# Patient Record
Sex: Male | Born: 1945 | Race: Black or African American | Hispanic: No | Marital: Single | Smoking: Current every day smoker
Health system: Southern US, Community
[De-identification: ages and names within clinical notes are randomized; demographics above are authoritative.]

## PROBLEM LIST (undated history)

## (undated) DIAGNOSIS — C3491 Malignant neoplasm of unspecified part of right bronchus or lung: Principal | ICD-10-CM

## (undated) DIAGNOSIS — C719 Malignant neoplasm of brain, unspecified: Secondary | ICD-10-CM

## (undated) DIAGNOSIS — I10 Essential (primary) hypertension: Secondary | ICD-10-CM

## (undated) DIAGNOSIS — R918 Other nonspecific abnormal finding of lung field: Secondary | ICD-10-CM

## (undated) DIAGNOSIS — R51 Headache: Secondary | ICD-10-CM

## (undated) DIAGNOSIS — K219 Gastro-esophageal reflux disease without esophagitis: Secondary | ICD-10-CM

## (undated) DIAGNOSIS — D571 Sickle-cell disease without crisis: Secondary | ICD-10-CM

## (undated) HISTORY — DX: Malignant neoplasm of unspecified part of right bronchus or lung: C34.91

## (undated) HISTORY — DX: Malignant neoplasm of brain, unspecified: C71.9

## (undated) HISTORY — PX: NO PAST SURGERIES: SHX2092

---

## 2011-10-11 ENCOUNTER — Emergency Department (HOSPITAL_COMMUNITY): Payer: Medicare Other

## 2011-10-11 ENCOUNTER — Other Ambulatory Visit: Payer: Self-pay

## 2011-10-11 ENCOUNTER — Observation Stay (HOSPITAL_COMMUNITY)
Admission: EM | Admit: 2011-10-11 | Discharge: 2011-10-12 | Disposition: A | Discharge status: Home / Self Care | Payer: Medicare Other | Source: Ambulatory Visit | Attending: Internal Medicine | Admitting: Internal Medicine

## 2011-10-11 ENCOUNTER — Inpatient Hospital Stay (HOSPITAL_COMMUNITY): Payer: Medicare Other

## 2011-10-11 ENCOUNTER — Encounter (HOSPITAL_COMMUNITY): Payer: Self-pay | Admitting: Emergency Medicine

## 2011-10-11 DIAGNOSIS — R911 Solitary pulmonary nodule: Secondary | ICD-10-CM | POA: Insufficient documentation

## 2011-10-11 DIAGNOSIS — F17200 Nicotine dependence, unspecified, uncomplicated: Secondary | ICD-10-CM

## 2011-10-11 DIAGNOSIS — I10 Essential (primary) hypertension: Secondary | ICD-10-CM

## 2011-10-11 DIAGNOSIS — F1010 Alcohol abuse, uncomplicated: Secondary | ICD-10-CM | POA: Diagnosis present

## 2011-10-11 DIAGNOSIS — R55 Syncope and collapse: Principal | ICD-10-CM | POA: Diagnosis present

## 2011-10-11 HISTORY — DX: Essential (primary) hypertension: I10

## 2011-10-11 HISTORY — DX: Sickle-cell disease without crisis: D57.1

## 2011-10-11 HISTORY — DX: Gastro-esophageal reflux disease without esophagitis: K21.9

## 2011-10-11 HISTORY — DX: Headache: R51

## 2011-10-11 LAB — CBC
MCHC: 37 g/dL — ABNORMAL HIGH (ref 30.0–36.0)
MCHC: 37.1 g/dL — ABNORMAL HIGH (ref 30.0–36.0)
MCV: 77.8 fL — ABNORMAL LOW (ref 78.0–100.0)
Platelets: 300 10*3/uL (ref 150–400)
RDW: 14.8 % (ref 11.5–15.5)
RDW: 14.6 % (ref 11.5–15.5)
WBC: 6.1 10*3/uL (ref 4.0–10.5)

## 2011-10-11 LAB — POCT I-STAT, CHEM 8
HCT: 49 % (ref 39.0–52.0)
Hemoglobin: 16.7 g/dL (ref 13.0–17.0)
Potassium: 3.5 mEq/L (ref 3.5–5.1)
Sodium: 140 mEq/L (ref 135–145)

## 2011-10-11 LAB — HEPATIC FUNCTION PANEL
ALT: 11 U/L (ref 0–53)
AST: 15 U/L (ref 0–37)
Bilirubin, Direct: 0.1 mg/dL (ref 0.0–0.3)
Indirect Bilirubin: 0.4 mg/dL (ref 0.3–0.9)
Total Protein: 7.5 g/dL (ref 6.0–8.3)

## 2011-10-11 LAB — URINALYSIS, ROUTINE W REFLEX MICROSCOPIC
Nitrite: NEGATIVE
Protein, ur: NEGATIVE mg/dL
Specific Gravity, Urine: 1.018 (ref 1.005–1.030)
Urobilinogen, UA: 1 mg/dL (ref 0.0–1.0)

## 2011-10-11 LAB — MAGNESIUM: Magnesium: 2.1 mg/dL (ref 1.5–2.5)

## 2011-10-11 LAB — CALCIUM
Calcium: 9.6 mg/dL (ref 8.4–10.5)
Calcium: 9.7 mg/dL (ref 8.4–10.5)

## 2011-10-11 LAB — CARDIAC PANEL(CRET KIN+CKTOT+MB+TROPI)
CK, MB: 1.7 ng/mL (ref 0.3–4.0)
Relative Index: INVALID (ref 0.0–2.5)
Troponin I: <0.3 ng/mL (ref <0.30)

## 2011-10-11 LAB — CREATININE, SERUM
GFR calc Af Amer: >90 mL/min (ref >90)
GFR calc non Af Amer: >90 mL/min (ref >90)

## 2011-10-11 LAB — POCT I-STAT TROPONIN I

## 2011-10-11 LAB — TSH: TSH: 1.095 u[IU]/mL (ref 0.350–4.500)

## 2011-10-11 MED ORDER — PROMETHAZINE HCL 25 MG/ML IJ SOLN
12.5000 mg | Freq: Four times a day (QID) | INTRAMUSCULAR | Status: DC | PRN
  Filled 2011-10-11: qty 1

## 2011-10-11 MED ORDER — IOHEXOL 300 MG/ML  SOLN
80.0000 mL | Freq: Once | INTRAMUSCULAR | Status: AC | PRN
  Administered 2011-10-11: 80 mL via INTRAVENOUS

## 2011-10-11 MED ORDER — LORAZEPAM 1 MG PO TABS: 1.0000 mg | ORAL_TABLET | Freq: Four times a day (QID) | ORAL | Status: DC | PRN

## 2011-10-11 MED ORDER — ADULT MULTIVITAMIN W/MINERALS CH
1.0000 | ORAL_TABLET | Freq: Every day | ORAL | Status: DC
  Administered 2011-10-11 – 2011-10-12 (×2): 1 via ORAL
  Filled 2011-10-11 (×2): qty 1

## 2011-10-11 MED ORDER — ALUM & MAG HYDROXIDE-SIMETH 200-200-20 MG/5ML PO SUSP: 30.0000 mL | Freq: Four times a day (QID) | ORAL | Status: DC | PRN

## 2011-10-11 MED ORDER — THIAMINE HCL 100 MG/ML IJ SOLN
Freq: Once | INTRAVENOUS | Status: AC
  Administered 2011-10-11: 15:00:00 via INTRAVENOUS
  Filled 2011-10-11: qty 1000

## 2011-10-11 MED ORDER — PROMETHAZINE HCL 25 MG PO TABS
12.5000 mg | ORAL_TABLET | Freq: Four times a day (QID) | ORAL | Status: DC | PRN
  Filled 2011-10-11: qty 1

## 2011-10-11 MED ORDER — NICOTINE 7 MG/24HR TD PT24
7.0000 mg | MEDICATED_PATCH | Freq: Every day | TRANSDERMAL | Status: DC
  Administered 2011-10-11 – 2011-10-12 (×2): 7 mg via TRANSDERMAL
  Filled 2011-10-11 (×2): qty 1

## 2011-10-11 MED ORDER — INFLUENZA VIRUS VACC SPLIT PF IM SUSP
0.5000 mL | Freq: Once | INTRAMUSCULAR | Status: DC
  Filled 2011-10-11: qty 0.5

## 2011-10-11 MED ORDER — AMLODIPINE BESYLATE 10 MG PO TABS
10.0000 mg | ORAL_TABLET | Freq: Every day | ORAL | Status: DC
  Administered 2011-10-11 – 2011-10-12 (×2): 10 mg via ORAL
  Filled 2011-10-11 (×2): qty 1

## 2011-10-11 MED ORDER — HEPARIN SODIUM (PORCINE) 5000 UNIT/ML IJ SOLN
5000.0000 [IU] | Freq: Three times a day (TID) | INTRAMUSCULAR | Status: DC
  Administered 2011-10-11 – 2011-10-12 (×3): 5000 [IU] via SUBCUTANEOUS
  Filled 2011-10-11 (×5): qty 1

## 2011-10-11 MED ORDER — VITAMIN B-1 100 MG PO TABS
100.0000 mg | ORAL_TABLET | Freq: Every day | ORAL | Status: DC
  Administered 2011-10-11 – 2011-10-12 (×2): 100 mg via ORAL
  Filled 2011-10-11 (×2): qty 1

## 2011-10-11 MED ORDER — ASPIRIN EC 81 MG PO TBEC
81.0000 mg | DELAYED_RELEASE_TABLET | Freq: Every day | ORAL | Status: DC
  Administered 2011-10-12: 81 mg via ORAL
  Filled 2011-10-11: qty 1

## 2011-10-11 MED ORDER — LORAZEPAM 2 MG/ML IJ SOLN: 1.0000 mg | Freq: Four times a day (QID) | INTRAMUSCULAR | Status: DC | PRN

## 2011-10-11 MED ORDER — THIAMINE HCL 100 MG/ML IJ SOLN
100.0000 mg | Freq: Every day | INTRAMUSCULAR | Status: DC
  Filled 2011-10-11 (×2): qty 1

## 2011-10-11 MED ORDER — ASPIRIN 81 MG PO CHEW: 81.0000 mg | CHEWABLE_TABLET | Freq: Once | ORAL | Status: DC

## 2011-10-11 MED ORDER — PNEUMOCOCCAL VAC POLYVALENT 25 MCG/0.5ML IJ INJ
0.5000 mL | INJECTION | INTRAMUSCULAR | Status: DC
  Filled 2011-10-11: qty 0.5

## 2011-10-11 MED ORDER — SODIUM CHLORIDE 0.9 % IJ SOLN: 3.0000 mL | Freq: Two times a day (BID) | INTRAMUSCULAR | Status: DC

## 2011-10-11 MED ORDER — FOLIC ACID 1 MG PO TABS
1.0000 mg | ORAL_TABLET | Freq: Every day | ORAL | Status: DC
  Administered 2011-10-11: 1 mg via ORAL
  Filled 2011-10-11 (×2): qty 1

## 2011-10-11 NOTE — Progress Notes (Signed)
Pt is from home and presented to the ED on 10/11/11 after having a syncopal episode.  Pt is alert and oriented x4 and his neuro assessment was within normal limits.  Skin is intact with no evidence of wounds or breakdown.  Pt is on telemetry running NSR.  Pt has been placed on fall and seizure precautions.  Will continue to treat per MD orders.

## 2011-10-11 NOTE — H&P (Signed)
Hospital Admission Note Date: 10/11/2011  Patient name: Brendan Hooper Medical record number: [id number] Date of birth: 1946/01/10 Age: 66 y.o. Gender: male PCP: Quitman Livings, MD, MD  Medical Service: Cecille Rubin  Attending physician:  Dr. Josem Kaufmann   1st Contact:   Dr. Dorise Hiss Pager: [phone] 2nd Contact:   Dr. Tonny Branch Pager:[phone] After 5 pm or weekends: 1st Contact:      Pager: [phone] 2nd Contact:      Pager: [phone]  Chief Complaint: " I passed out for 3 seconds."  History of Present Illness: The patient is a 67 YO male with past medical history of hypertension and smoking and alcohol abuse. He had an episode of witnessed syncope today. He was using the bathroom and was feeling weak afterwards. He then went to stand and walk back to bed and felt dizzy and like he was going to pass out. He then passed out onto the bed. He denied hitting his head. This event was witnessed by his son who told that he passed out for a few seconds. No shaking was witnessed. No loss of bowel or bladders. No problems answering questions directly afterwards. He is currently denying any pain anywhere. No chest pain or history of heart problems. No breathing problems. He admits to drinking about a 6 pack of beer per day and about a pint of alcohol per weekends. No dyspnea, orthopnea, leg swelling.    Patient denies following Sx before the episode: Lightheadedness, Racing heart ,Chest pain, Vision problems, Nausea/Vomiting/Diarrhea, tinnitus,Headache, Abdominal pain, Black/bloody stools, Fever/chills or SOB   Patient denies similar symptoms in the past.  ER Tx given - none  Advanced directives:Full Code  Meds: Medications Prior to Admission  Medication Dose Route Frequency Provider Last Rate Last Dose  . alum & mag hydroxide-simeth (MAALOX/MYLANTA) 200-200-20 MG/5ML suspension 30 mL  30 mL Oral Q6H PRN Deatra Robinson, MD      . aspirin chewable tablet 81 mg  81 mg Oral Once Doug Sou, MD      . aspirin EC  tablet 81 mg  81 mg Oral Daily Deatra Robinson, MD      . heparin injection 5,000 Units  5,000 Units Subcutaneous Q8H Deatra Robinson, MD      . promethazine (PHENERGAN) tablet 12.5 mg  12.5 mg Oral Q6H PRN Deatra Robinson, MD       Or  . promethazine (PHENERGAN) injection 12.5 mg  12.5 mg Intravenous Q6H PRN Deatra Robinson, MD      . sodium chloride 0.9 % 1,000 mL with thiamine 100 mg, folic acid 1 mg, multivitamins adult 10 mL infusion   Intravenous Once Deatra Robinson, MD      . sodium chloride 0.9 % injection 3 mL  3 mL Intravenous Q12H Deatra Robinson, MD       No current outpatient prescriptions on file as of 10/11/2011.   Meds: HCTZ 25 mg PO daily Amlodopine 10 mg PO daily  Allergies: Review of patient's allergies indicates no known allergies. Past Medical History  Diagnosis Date  . Hypertension    History reviewed. No pertinent past surgical history. No family history on file. History   Social History  . Marital Status: Single    Spouse Name: N/A    Number of Children: N/A  . Years of Education: N/A   Occupational History  . Not on file.   Social History Main Topics  . Smoking status: Current Everyday Smoker -- 1.0 packs/day  . Smokeless tobacco: Not on file  . Alcohol Use:  1.2 oz/week    2 Shots of liquor per week  . Drug Use: No  . Sexually Active:    Other Topics Concern  . Not on file   Social History Narrative  . No narrative on file    Review of Systems: Per HPI  Physical Exam: Blood pressure 151/74, pulse 86, temperature 98.3 F (36.8 C), temperature source Oral, resp. rate 25, SpO2 100.00%. General Appearance:  Alert, cooperative, no distress, appears stated age   Head:  Normocephalic, without obvious abnormality, atraumatic   Eyes:  PERRL, conjunctival injection bilaterally noted, EOM's intact, fundi benign, both eyes   Ears:  Normal TM's and external ear canals, both ears   Nose:  Nares normal, septum midline, mucosa normal, no drainage    or sinus tenderness   Throat:  Lips, mucosa, and tongue normal; poor dentition   Neck:  Supple, symmetrical, trachea midline, no adenopathy;  thyroid: No enlargement/tenderness/nodules; no carotid  bruit or JVD   Back:  Symmetric, no curvature, ROM normal, no CVA tenderness   Lungs:  Clear to auscultation bilaterally, respirations unlabored   Chest wall:  No tenderness or deformity   Heart:  Regular rate and rhythm, S1 and S2 normal, no murmur, rub  or gallop   Abdomen:  Soft, non-tender, bowel sounds active all four quadrants,  no masses, no organomegaly   Extremities:  No pedal edema or cyanoses bilaterally.  Pulses:  2+ and symmetric all extremities   Skin:  Skin color, texture, turgor normal, no rashes or lesions   Lymph nodes:  Cervical, supraclavicular, and axillary nodes normal    CXR: pending  Lab results: Basic Metabolic Panel:  [facility] 10/11/11 1208  NA 140  K 3.5  CL 102  CO2 --  GLUCOSE 101*  BUN 9  CREATININE 0.90  CALCIUM --  MG --  PHOS --   CBC:  Basename 10/11/11 1208 10/11/11 1147  WBC -- 4.6  NEUTROABS -- --  HGB 16.7 16.0  HCT 49.0 43.2  MCV -- 78.1  PLT -- 276   Imaging results:  CXR (PA/L): pending Other results: NWG:NFAOZHYQ. No old EKG for comparison.  Assessment & Plan by Problem:  ASSESSMENT: Mr. Dorer is a 66 y/o AA gentleman with a PMHx of HTN and alcoholism admitted with a  Witnessed syncopal episode. Differential list of Dx include but not limited to: *vasovagal episode vs *Arrhythmia vs *Seizure vs *TIA vs *PE (unlikely, modified geneva score is 0). *Incidental lung nodule noted, with smoking is high risk. Will work up.  PLAN: - Telemetry 24 hrs - Cardiac enzymes x 2 q 8hr - EKG x1 -  Consider 2D Echo to eval for wall motion abnormality. - UA, UDS, TSH - continue with home dose of Amlodipine and hold HCTZ - Start ASA - FLP, HIV - CIWA protocol -Fall and seizure precautions -NS +Folate/Thiamine IVF at 100  cc/hr - Heparin 5000 U SQ BID or Lovenox 40 mg SQ daily -CT chest with contrast to evaluate lung nodule.  Genella Mech 4:51 PM 10/11/2011  Signed: Deatra Robinson 10/11/2011, 2:10 PM

## 2011-10-11 NOTE — ED Notes (Signed)
Pt states he was coming out of the bathroom and fell across the bed. Denies dizziness but states he started sweating and felt nausea. Denies vomiting.witnessed per son.States he was not unconscious.

## 2011-10-11 NOTE — ED Notes (Signed)
Per EMS: Pt stated he felt nauseous his son witnessed him passing out, which lasted about a minute. BP: 122/72 84 R: 18 CBG: 101.  Pt is alert and oriented.  Pt stated he has "alot of alcohol" last night.  Pt had some diarrhea last week.

## 2011-10-11 NOTE — ED Provider Notes (Signed)
History     CSN: [id number]  Arrival date & time 10/11/11  1058   First MD Initiated Contact with Patient 10/11/11 1105      No chief complaint on file.   (Consider location/radiation/quality/duration/timing/severity/associated sxs/prior treatment) HPI Complains of syncopal event this morning immediately prior to coming here witnessed by his son .patient states he was sweaty for 1 or 2 minutes prior to event denies chest pain denies shortness of breath admits to drinking alcohol last night approximately 1 pint. Presently asymptomatic. No treatment prior to coming here. No pain. No other associated symptoms Past Medical History  Diagnosis Date  . Hypertension     History reviewed. No pertinent past surgical history.  No family history on file.  History  Substance Use Topics  . Smoking status: Current Everyday Smoker -- 1.0 packs/day  . Smokeless tobacco: Not on file  . Alcohol Use: 1.2 oz/week    2 Shots of liquor per week      Review of Systems  Constitutional: Negative.   HENT: Negative.   Respiratory: Negative.   Cardiovascular: Negative.        Syncope  Gastrointestinal: Negative.   Musculoskeletal: Negative.   Skin: Negative.        Diaphoresis  Neurological: Negative.   Hematological: Negative.   Psychiatric/Behavioral: Negative.   All other systems reviewed and are negative.    Allergies  Review of patient's allergies indicates no known allergies.  Home Medications   Current Outpatient Rx  Name Route Sig Dispense Refill  . AMLODIPINE BESYLATE 10 MG PO TABS Oral Take 10 mg by mouth daily.    Marland Kitchen HYDROCHLOROTHIAZIDE 25 MG PO TABS Oral Take 25 mg by mouth daily.      BP 153/78  Pulse 80  Temp(Src) 98.3 F (36.8 C) (Oral)  Resp 15  SpO2 98%  Physical Exam  Nursing note and vitals reviewed. Constitutional: He appears well-developed and well-nourished.  HENT:  Head: Normocephalic and atraumatic.  Eyes: Conjunctivae are normal. Pupils are equal,  round, and reactive to light.  Neck: Neck supple. No tracheal deviation present. No thyromegaly present.       No bruit  Cardiovascular: Normal rate and regular rhythm.   No murmur heard. Pulmonary/Chest: Effort normal and breath sounds normal.  Abdominal: Soft. Bowel sounds are normal. He exhibits no distension. There is no tenderness.  Musculoskeletal: Normal range of motion. He exhibits no edema and no tenderness.  Neurological: He is alert. Coordination normal.  Skin: Skin is warm and dry. No rash noted.  Psychiatric: He has a normal mood and affect.    ED Course  Procedures (including critical care time) 12:35 PM resting comfortably alert Glasgow Coma Score 15 Labs Reviewed - No data to display No results found.   Date: 10/11/2011  Rate: 85  Rhythm: normal sinus rhythm  QRS Axis: left  Intervals: normal  ST/T Wave abnormalities: nonspecific T wave changes  Conduction Disutrbances:left anterior fascicular block  Narrative Interpretation:   Old EKG Reviewed: none available  No diagnosis found.    MDM  Spoke with resident physician from [facility] outpatient clinic teaching service Plan admit telemetry. I suggest monitor for alcohol withdrawal Diagnosis  syncope        Doug Sou, MD 10/11/11 1239

## 2011-10-12 DIAGNOSIS — R55 Syncope and collapse: Secondary | ICD-10-CM

## 2011-10-12 DIAGNOSIS — F1010 Alcohol abuse, uncomplicated: Secondary | ICD-10-CM

## 2011-10-12 LAB — RAPID URINE DRUG SCREEN, HOSP PERFORMED
Barbiturates: NOT DETECTED
Benzodiazepines: NOT DETECTED
Tetrahydrocannabinol: NOT DETECTED

## 2011-10-12 LAB — GLUCOSE, CAPILLARY: Glucose-Capillary: 107 mg/dL — ABNORMAL HIGH (ref 70–99)

## 2011-10-12 LAB — CARDIAC PANEL(CRET KIN+CKTOT+MB+TROPI): Relative Index: INVALID (ref 0.0–2.5)

## 2011-10-12 NOTE — H&P (Signed)
Internal Medicine Attending Admission Note Date: 10/12/2011  Patient name: Brendan Hooper Medical record number: [id number] Date of birth: Jul 21, 1946 Age: 66 y.o. Gender: male  I saw and evaluated the patient. I reviewed the resident's note and I agree with the resident's findings and plan as documented in the resident's note.  Chief Complaint(s): Witnessed syncopal episode.  History - key components related to admission:  Brendan Hooper is a 66 year old man with a history of hypertension and alcohol and tobacco abuse who presents with an episode of witnessed syncope on the morning of admission. Apparently Brendan Hooper had a recent spat of diarrhea. He was in the bathroom having a bowel movement and felt weak when he got up. He walked 15 feet to his bed and felt lightheaded. There was no vertigo. When he got to the bed he apparently lost consciousness onto the bed and therefore had no injury. This event was witnessed by his son who said that Brendan Hooper was out for approximately 2-3 seconds. In the 10 seconds prior to the syncope Brendan Hooper denies palpitations, chest pain, shortness of breath, headaches, numbness, weakness, or change in vision. He has actually felt well recently with the exception of the mild diarrhea. He has no history of syncope and denies a family history of sudden cardiac death. He is without acute complaints at this time.  Physical Exam - key components related to admission:  Filed Vitals:   10/11/11 2043 10/12/11 0607 10/12/11 0608 10/12/11 0609  BP: 127/79 136/70 136/76 134/82  Pulse: 76 66 64 64  Temp: 98.3 F (36.8 C) 98.1 F (36.7 C)    TempSrc: Oral Oral    Resp: 18 16    Height:      Weight:      SpO2: 97% 97%     General: Well-developed well-nourished man sitting comfortably in bed eating pancakes for breakfast in no acute distress. HEENT: Atraumatic. Lungs: Clear to auscultation bilaterally without wheezes, rhonchi, or rales. Heart: Regular rate and rhythm without  murmurs, rubs, or gallops. Abdomen: Soft, non tender, active bowel sounds. Extremities: Without edema.  Lab results:  Basic Metabolic Panel:  Basename 10/11/11 1604 10/11/11 1441 10/11/11 1208  NA -- -- 140  K -- -- 3.5  CL -- -- 102  CO2 -- -- --  GLUCOSE -- -- 101*  BUN -- -- 9  CREATININE -- 0.79 0.90  CALCIUM 9.7 9.6 --  MG -- 2.1 --  PHOS -- -- --   Liver Function Tests:  [facility] 10/11/11 1604  AST 15  ALT 11  ALKPHOS 81  BILITOT 0.5  PROT 7.5  ALBUMIN 3.9   CBC:  Basename 10/11/11 1441 10/11/11 1208 10/11/11 1147  WBC 6.1 -- 4.6  NEUTROABS -- -- --  HGB 16.4 16.7 --  HCT 44.2 49.0 --  MCV 77.8* -- 78.1  PLT 300 -- 276   Cardiac Enzymes:  Basename 10/12/11 0556 10/11/11 2104 10/11/11 1441  CKTOTAL 73 72 74  CKMB 1.7 1.7 2.0  CKMBINDEX -- -- --  TROPONINI <0.30 <0.30 <0.30   CBG:  Basename 10/12/11 0742  GLUCAP 107*   Thyroid Function Tests:  Basename 10/11/11 1441  TSH 1.095  T4TOTAL --  FREET4 --  T3FREE --  THYROIDAB --   Urine Drug Screen:  Negative  Alcohol Level:  Basename 10/11/11 1604  ETH <11   Urinalysis:  Unremarkable  Imaging results:  X-ray Chest Pa And Lateral   10/11/2011  *RADIOLOGY REPORT*  Clinical Data: Syncope.  Hypertension.  Smoking history.  CHEST - 2 VIEW  Comparison: None.  Findings: Heart size is normal.  The aorta shows calcification and unfolding.  There is an approximately 15 mm mass within the inferior right upper lobe just above the minor fissure.  The remainder the chest is clear.  No effusions.  No bony abnormalities.  IMPRESSION: Approximately 15 mm mass within the inferior right upper lobe.  Are there old films for comparison?  This is worrisome for developing lung cancer.  In the absence of old studies, chest CT with contrast would be suggested.  These results will be called to the ordering clinician or representative by the Radiologist Assistant, and communication documented in the PACS Dashboard.   Original Report Authenticated By: Thomasenia Sales, M.D.   Ct Chest W Contrast  10/11/2011  *RADIOLOGY REPORT*  Clinical Data: Pulmonary nodule on chest radiography.  CT CHEST WITH CONTRAST  Technique:  Multidetector CT imaging of the chest was performed following the standard protocol during bolus administration of intravenous contrast.  Contrast: 80mL OMNIPAQUE IOHEXOL 300 MG/ML IV SOLN  Comparison: 10/11/2011  Findings: Left infrahilar node measures 0.8 cm in short axis.  A right infrahilar node measures 0.9 cm in short axis.  An indistinct right hilar node measures 1.0 cm in short axis.  No paratracheal or AP window adenopathy is observed.  A 1.5 x 1.6 cm right upper lobe pulmonary nodule is present just above the minor fissure on image 29 of series 3, corresponding to the nodular density on chest radiography.  Subsegmental atelectasis is present in both lower lobes. Centrilobular emphysema is present.  Old healed right-sided rib fractures are noted.  A hypodense 5 mm lesion in the medial segment left hepatic lobe, image 50 of series 2, is probably a cyst but technically nonspecific.  Next phytic fluid density 3.9 x 3.0 cm lesion of the right kidney upper pole is compatible with cyst.  We partially visualize another right mid kidney cyst.  IMPRESSION:  1.  1.6 cm right upper lobe pulmonary nodule is based along the minor fissure, but remain suspicious for malignancy.  Consider tissue sampling or PET CT for further characterization. 2.  Borderline right hilar and borderline bilateral infrahilar adenopathy. 3.  Subsegmental atelectasis in both lower lobes. 4.  Right renal cysts; probable cyst in the medial segment left hepatic lobe. 5.  Emphysema.  Original Report Authenticated By: Dellia Cloud, M.D.   Other results:  EKG: normal sinus rhythm at 85 beats per minute, left anterior hemiblock, no significant Q waves, no LVH, good R wave progression, no ST-T changes. No comparisons immediately  available.  Assessment & Plan by Problem:  Brendan Hooper is a 66 year old man with a history of hypertension, and tobacco and alcohol abuse who presents after an episode of witnessed syncope. The event occurred after having a bowel movement and standing up to walk to the bed. Orthostatic vital signs were negative. Thus orthostatic hypotension is unlikely. It is most likely he had a vasovagal episode associated with a Valsalva maneuver during a bowel movement and then standing up and walking to the bed. He is ruled out for myocardial infarction, has a negative neurologic exam, and his had no events on telemetry overnight. Given this unremarkable evaluation further invasive testing is not indicated at this time.  The incidentally found nodule on his chest x-ray is of some concern. With his history of smoking he is at risk for lung cancer. The 1.5 x 1.6 cm anterior subsegment right upper  lobe nodule will need further evaluation. Unfortunately, the last chest x-ray he had [place] in [facility] approximately 10 years ago and is unlikely to be available for review. CT scan shows borderline hilar adenopathy but no other lesions of concern. Given the intermediate to high risk with his history of smoking further evaluation is prudent. Fortunately, the workup of lung nodules is an outpatient procedure.  1) Syncope: Upon discharge we will refer Brendan Hooper to cardiology in order to obtain a Holter monitor. We will also advise him to get up slowly especially after having a bowel movement.  2) Right upper lobe anterior subsegment nodule: We will contact Brendan Hooper primary care provider and informed him of the nodule. It is deep in the parenchyma and will be difficult to access with a percutaneous needle. There did not appear to be any air bronchograms leading into the lesion making bronchoscopy a less than 50-50 chance of being diagnostic. We would recommend a CT PET scan to further assess the nodule and the mediastinum.  If the nodule were to be metabolically active our recommendation would be to refer him to thoracic surgery for a diagnostic and potentially therapeutic excision assuming the mediastinum did not light up. If the mediastinum had metabolic activity that was increased one could consider doing a mediastinoscopy for tissue and potential staging.  3) Disposition: I agree with the housestaff's plan to discharge Brendan Hooper home today with an outpatient Holter monitor and likely PET CT scan if his primary care provider agrees.

## 2011-10-12 NOTE — Discharge Summary (Signed)
Internal Medicine Teaching [facility] Discharge Note  Name: Brendan Hooper MRN: [id number] DOB: [birth year] 66 y.o.  Date of Admission: 10/11/2011 10:58 AM Date of Discharge: 10/12/2011 Attending Physician: Rocco Serene, MD  Discharge Diagnosis: Active Problems:  Syncope  Alcohol abuse  HTN (hypertension)  Active smoker Pulmonary Nodule  Discharge Medications: Medication List  As of 10/12/2011 10:27 AM   TAKE these medications         amLODipine 10 MG tablet   Commonly known as: NORVASC   Take 10 mg by mouth daily.      hydrochlorothiazide 25 MG tablet   Commonly known as: HYDRODIURIL   Take 25 mg by mouth daily.            Disposition and follow-up:   Brendan Hooper was discharged from [facility] in Good condition.    Follow-up Appointments: Follow-up Information    Follow up with [facility], MD. (Please go to your regularly scheduled apt on Feb 26. )         Discharge Orders    Future Orders Please Complete By Expires   Diet - low sodium heart healthy      Increase activity slowly         Consultations:    Procedures Performed:  X-ray Chest Pa And Lateral   10/11/2011  *RADIOLOGY REPORT*  Clinical Data: Syncope.  Hypertension.  Smoking history.  CHEST - 2 VIEW  Comparison: None.  Findings: Heart size is normal.  The aorta shows calcification and unfolding.  There is an approximately 15 mm mass within the inferior right upper lobe just above the minor fissure.  The remainder the chest is clear.  No effusions.  No bony abnormalities.  IMPRESSION: Approximately 15 mm mass within the inferior right upper lobe.  Are there old films for comparison?  This is worrisome for developing lung cancer.  In the absence of old studies, chest CT with contrast would be suggested.  These results will be called to the ordering clinician or representative by the Radiologist Assistant, and communication documented in the PACS Dashboard.  Original Report  Authenticated By: Thomasenia Sales, M.D.   Ct Chest W Contrast  10/11/2011  *RADIOLOGY REPORT*  Clinical Data: Pulmonary nodule on chest radiography.  CT CHEST WITH CONTRAST  Technique:  Multidetector CT imaging of the chest was performed following the standard protocol during bolus administration of intravenous contrast.  Contrast: 80mL OMNIPAQUE IOHEXOL 300 MG/ML IV SOLN  Comparison: 10/11/2011  Findings: Left infrahilar node measures 0.8 cm in short axis.  A right infrahilar node measures 0.9 cm in short axis.  An indistinct right hilar node measures 1.0 cm in short axis.  No paratracheal or AP window adenopathy is observed.  A 1.5 x 1.6 cm right upper lobe pulmonary nodule is present just above the minor fissure on image 29 of series 3, corresponding to the nodular density on chest radiography.  Subsegmental atelectasis is present in both lower lobes. Centrilobular emphysema is present.  Old healed right-sided rib fractures are noted.  A hypodense 5 mm lesion in the medial segment left hepatic lobe, image 50 of series 2, is probably a cyst but technically nonspecific.  Next phytic fluid density 3.9 x 3.0 cm lesion of the right kidney upper pole is compatible with cyst.  We partially visualize another right mid kidney cyst.  IMPRESSION:  1.  1.6 cm right upper lobe pulmonary nodule is based along the minor fissure, but remain suspicious for malignancy.  Consider tissue sampling or PET CT for further characterization. 2.  Borderline right hilar and borderline bilateral infrahilar adenopathy. 3.  Subsegmental atelectasis in both lower lobes. 4.  Right renal cysts; probable cyst in the medial segment left hepatic lobe. 5.  Emphysema.  Original Report Authenticated By: Dellia Cloud, M.D.   Admission HPI:  The patient is a 66 YO male with past medical history of hypertension and smoking and alcohol abuse. He had an episode of witnessed syncope today. He was using the bathroom and was feeling weak  afterwards. He then went to stand and walk back to bed and felt dizzy and like he was going to pass out. He then passed out onto the bed. He denied hitting his head. This event was witnessed by his son who told that he passed out for a few seconds. No shaking was witnessed. No loss of bowel or bladders. No problems answering questions directly afterwards. He is currently denying any pain anywhere. No chest pain or history of heart problems. No breathing problems. He admits to drinking about a 6 pack of beer per day and about a pint of alcohol per weekends. No dyspnea, orthopnea, leg swelling.  Patient denies following Sx before the episode: Lightheadedness, Racing heart ,Chest pain, Vision problems, Nausea/Vomiting/Diarrhea, tinnitus,Headache, Abdominal pain, Black/bloody stools, Fever/chills or SOB  Patient denies similar symptoms in the past.  ER Tx given - none  Advanced directives:Full Code  Hospital Course by problem list:  Syncope - Likely cause was vasovagal.  Episode occurred after defecation. No events on cardiac monitoring overnight and no new EKG changes. Could consider Holter monitor as an out-patient to verify no arrythmia. Discussed this with the patient and he seemed to want to hold off on this option for now but I did give him information about Holter monitoring. No ECHO needed at this time. Cardiac enzymes were negative during this hospitalization. Orthostatic vitals were done and were negative. Will leave Holter up to the discretion of the PCP.    Alcohol abuse - Admits to 6 pack per night and 1 pint of alcohol on some weekends. Discussed cessation with him and he does not think it is a problem at this time and would not like to quit or cut back. Continue to advise cessation in the future.    HTN (hypertension) - BP controlled during this stay on his home medications and discharged on his home medications: amlodipine 10 mg PO daily and HCTZ 25 mg PO daily. BP on discharge was 134/82.    Active smoker - Advised cessation and he did not seem to want to quit at this time. Continue to encourage cessation or cutting back.  Pulmonary Nodule - Noted on CXR and was evaluated with CT with contrast of the chest. No past CXR to compare to and per the patient he had an old one in jail but we could not access that. CT revealed a lymph node and recommended either tissue sampling or PET CT. We discussed with the patient the seriousness of this lesion and that about 40% of lesions this size are cancerous. He did not seem very interested in following up on this but it is very important that he get follow up. If he does not want a PET or sampling then at the very least a repeat CT in 3-6 months should be obtained for stability. Sampling may be difficult given the placement of the lesion but if PET is done and lymph node is positive then that  may be easier to sample for biopsy.    Discharge Vitals:  BP 134/82  Pulse 64  Temp(Src) 98.1 F (36.7 C) (Oral)  Resp 16  Ht 6' (1.829 m)  Wt 156 lb (70.761 kg)  BMI 21.16 kg/m2  SpO2 97%  Discharge Labs:  Results for orders placed during the hospital encounter of 10/11/11 (from the past 24 hour(s))  CBC     Status: Abnormal   Collection Time   10/11/11 11:47 AM      Component Value Range   WBC 4.6  4.0 - 10.5 (K/uL)   RBC 5.53  4.22 - 5.81 (MIL/uL)   Hemoglobin 16.0  13.0 - 17.0 (g/dL)   HCT 47.8  29.5 - 62.1 (%)   MCV 78.1  78.0 - 100.0 (fL)   MCH 28.9  26.0 - 34.0 (pg)   MCHC 37.0 (*) 30.0 - 36.0 (g/dL)   RDW 30.8  65.7 - 84.6 (%)   Platelets 276  150 - 400 (K/uL)  POCT I-STAT TROPONIN I     Status: Normal   Collection Time   10/11/11 12:07 PM      Component Value Range   Troponin i, poc 0.00  0.00 - 0.08 (ng/mL)   Comment 3           POCT I-STAT, CHEM 8     Status: Abnormal   Collection Time   10/11/11 12:08 PM      Component Value Range   Sodium 140  135 - 145 (mEq/L)   Potassium 3.5  3.5 - 5.1 (mEq/L)   Chloride 102  96 - 112 (mEq/L)    BUN 9  6 - 23 (mg/dL)   Creatinine, Ser 9.62  0.50 - 1.35 (mg/dL)   Glucose, Bld 952 (*) 70 - 99 (mg/dL)   Calcium, Ion 8.41  3.24 - 1.32 (mmol/L)   TCO2 29  0 - 100 (mmol/L)   Hemoglobin 16.7  13.0 - 17.0 (g/dL)   HCT 40.1  02.7 - 25.3 (%)  URINALYSIS, ROUTINE W REFLEX MICROSCOPIC     Status: Normal   Collection Time   10/11/11  2:08 PM      Component Value Range   Color, Urine YELLOW  YELLOW    APPearance CLEAR  CLEAR    Specific Gravity, Urine 1.018  1.005 - 1.030    pH 5.5  5.0 - 8.0    Glucose, UA NEGATIVE  NEGATIVE (mg/dL)   Hgb urine dipstick NEGATIVE  NEGATIVE    Bilirubin Urine NEGATIVE  NEGATIVE    Ketones, ur NEGATIVE  NEGATIVE (mg/dL)   Protein, ur NEGATIVE  NEGATIVE (mg/dL)   Urobilinogen, UA 1.0  0.0 - 1.0 (mg/dL)   Nitrite NEGATIVE  NEGATIVE    Leukocytes, UA NEGATIVE  NEGATIVE   CBC     Status: Abnormal   Collection Time   10/11/11  2:41 PM      Component Value Range   WBC 6.1  4.0 - 10.5 (K/uL)   RBC 5.68  4.22 - 5.81 (MIL/uL)   Hemoglobin 16.4  13.0 - 17.0 (g/dL)   HCT 66.4  40.3 - 47.4 (%)   MCV 77.8 (*) 78.0 - 100.0 (fL)   MCH 28.9  26.0 - 34.0 (pg)   MCHC 37.1 (*) 30.0 - 36.0 (g/dL)   RDW 25.9  56.3 - 87.5 (%)   Platelets 300  150 - 400 (K/uL)  CREATININE, SERUM     Status: Normal   Collection Time   10/11/11  2:41 PM      Component Value Range   Creatinine, Ser 0.79  0.50 - 1.35 (mg/dL)   GFR calc non Af Amer >90  >90 (mL/min)   GFR calc Af Amer >90  >90 (mL/min)  CALCIUM     Status: Normal   Collection Time   10/11/11  2:41 PM      Component Value Range   Calcium 9.6  8.4 - 10.5 (mg/dL)  MAGNESIUM     Status: Normal   Collection Time   10/11/11  2:41 PM      Component Value Range   Magnesium 2.1  1.5 - 2.5 (mg/dL)  TSH     Status: Normal   Collection Time   10/11/11  2:41 PM      Component Value Range   TSH 1.095  0.350 - 4.500 (uIU/mL)  CARDIAC PANEL(CRET KIN+CKTOT+MB+TROPI)     Status: Normal   Collection Time   10/11/11  2:41 PM       Component Value Range   Total CK 74  7 - 232 (U/L)   CK, MB 2.0  0.3 - 4.0 (ng/mL)   Troponin I <0.30  <0.30 (ng/mL)   Relative Index RELATIVE INDEX IS INVALID  0.0 - 2.5   CALCIUM     Status: Normal   Collection Time   10/11/11  4:04 PM      Component Value Range   Calcium 9.7  8.4 - 10.5 (mg/dL)  HEPATIC FUNCTION PANEL     Status: Normal   Collection Time   10/11/11  4:04 PM      Component Value Range   Total Protein 7.5  6.0 - 8.3 (g/dL)   Albumin 3.9  3.5 - 5.2 (g/dL)   AST 15  0 - 37 (U/L)   ALT 11  0 - 53 (U/L)   Alkaline Phosphatase 81  39 - 117 (U/L)   Total Bilirubin 0.5  0.3 - 1.2 (mg/dL)   Bilirubin, Direct 0.1  0.0 - 0.3 (mg/dL)   Indirect Bilirubin 0.4  0.3 - 0.9 (mg/dL)  ETHANOL     Status: Normal   Collection Time   10/11/11  4:04 PM      Component Value Range   Alcohol, Ethyl (B) <11  0 - 11 (mg/dL)  CARDIAC PANEL(CRET KIN+CKTOT+MB+TROPI)     Status: Normal   Collection Time   10/11/11  9:04 PM      Component Value Range   Total CK 72  7 - 232 (U/L)   CK, MB 1.7  0.3 - 4.0 (ng/mL)   Troponin I <0.30  <0.30 (ng/mL)   Relative Index RELATIVE INDEX IS INVALID  0.0 - 2.5   URINE RAPID DRUG SCREEN (HOSP PERFORMED)     Status: Normal   Collection Time   10/12/11  2:00 AM      Component Value Range   Opiates NONE DETECTED  NONE DETECTED    Cocaine NONE DETECTED  NONE DETECTED    Benzodiazepines NONE DETECTED  NONE DETECTED    Amphetamines NONE DETECTED  NONE DETECTED    Tetrahydrocannabinol NONE DETECTED  NONE DETECTED    Barbiturates NONE DETECTED  NONE DETECTED   CARDIAC PANEL(CRET KIN+CKTOT+MB+TROPI)     Status: Normal   Collection Time   10/12/11  5:56 AM      Component Value Range   Total CK 73  7 - 232 (U/L)   CK, MB 1.7  0.3 - 4.0 (ng/mL)   Troponin I <0.30  <  0.30 (ng/mL)   Relative Index RELATIVE INDEX IS INVALID  0.0 - 2.5   GLUCOSE, CAPILLARY     Status: Abnormal   Collection Time   10/12/11  7:42 AM      Component Value Range   Glucose-Capillary  107 (*) 70 - 99 (mg/dL)    Signed: Genella Mech 10/12/2011, 10:27 AM

## 2011-10-12 NOTE — Discharge Instructions (Signed)
You were seen for an episode of passing out. We do not believe that this is from your heart however you should talk to your doctor to see if he thinks it would be helpful to wear a monitor to watch your heart for several weeks. We did a scan of your lungs and you have a spot on your lungs that we think needs to be looked at further. We will send our recommendations to your doctor and it is very important that you follow up with this. The size that yours is has about a 30-40% chance of being cancer and should be followed.

## 2011-10-12 NOTE — Progress Notes (Signed)
Subjective: Pt is feeling well this morning. No complaints. He ate well last night and this morning. He is denying any pain anywhere. No chest pain, no headache. No repeat syncope or dizziness or lightheadedness.   Objective: Vital signs in last 24 hours: Filed Vitals:   10/11/11 2043 10/12/11 0607 10/12/11 0608 10/12/11 0609  BP: 127/79 136/70 136/76 134/82  Pulse: 76 66 64 64  Temp: 98.3 F (36.8 C) 98.1 F (36.7 C)    TempSrc: Oral Oral    Resp: 18 16    Height:      Weight:      SpO2: 97% 97%     Weight change:   Intake/Output Summary (Last 24 hours) at 10/12/11 1022 Last data filed at 10/11/11 2100  Gross per 24 hour  Intake    360 ml  Output      0 ml  Net    360 ml   Physical Exam: General: resting in bed HEENT: PERRL, EOMI, no scleral icterus Cardiac: RRR, no rubs, murmurs or gallops Pulm: clear to auscultation bilaterally, moving normal volumes of air Abd: soft, nontender, nondistended, BS present Ext: warm and well perfused, no pedal edema Neuro: alert and oriented X3, cranial nerves II-XII grossly intact  Lab Results: Basic Metabolic Panel:  Lab 10/11/11 4098 10/11/11 1441 10/11/11 1208  NA -- -- 140  K -- -- 3.5  CL -- -- 102  CO2 -- -- --  GLUCOSE -- -- 101*  BUN -- -- 9  CREATININE -- 0.79 0.90  CALCIUM 9.7 9.6 --  MG -- 2.1 --  PHOS -- -- --   Liver Function Tests:  Lab 10/11/11 1604  AST 15  ALT 11  ALKPHOS 81  BILITOT 0.5  PROT 7.5  ALBUMIN 3.9   CBC:  Lab 10/11/11 1441 10/11/11 1208 10/11/11 1147  WBC 6.1 -- 4.6  NEUTROABS -- -- --  HGB 16.4 16.7 --  HCT 44.2 49.0 --  MCV 77.8* -- 78.1  PLT 300 -- 276   Cardiac Enzymes:  Lab 10/12/11 0556 10/11/11 2104 10/11/11 1441  CKTOTAL 73 72 74  CKMB 1.7 1.7 2.0  CKMBINDEX -- -- --  TROPONINI <0.30 <0.30 <0.30   CBG:  Lab 10/12/11 0742  GLUCAP 107*   Thyroid Function Tests:  Lab 10/11/11 1441  TSH 1.095  T4TOTAL --  FREET4 --  T3FREE --  THYROIDAB --   Urine Drug  Screen: Drugs of Abuse     Component Value Date/Time   LABOPIA NONE DETECTED 10/12/2011 0200   COCAINSCRNUR NONE DETECTED 10/12/2011 0200   LABBENZ NONE DETECTED 10/12/2011 0200   AMPHETMU NONE DETECTED 10/12/2011 0200   THCU NONE DETECTED 10/12/2011 0200   LABBARB NONE DETECTED 10/12/2011 0200    Alcohol Level:  Lab 10/11/11 1604  ETH <11   Urinalysis:  Lab 10/11/11 1408  COLORURINE YELLOW  LABSPEC 1.018  PHURINE 5.5  GLUCOSEU NEGATIVE  HGBUR NEGATIVE  BILIRUBINUR NEGATIVE  KETONESUR NEGATIVE  PROTEINUR NEGATIVE  UROBILINOGEN 1.0  NITRITE NEGATIVE  LEUKOCYTESUR NEGATIVE   Studies/Results: X-ray Chest Pa And Lateral   10/11/2011  *RADIOLOGY REPORT*  Clinical Data: Syncope.  Hypertension.  Smoking history.  CHEST - 2 VIEW  Comparison: None.  Findings: Heart size is normal.  The aorta shows calcification and unfolding.  There is an approximately 15 mm mass within the inferior right upper lobe just above the minor fissure.  The remainder the chest is clear.  No effusions.  No bony abnormalities.  IMPRESSION:  Approximately 15 mm mass within the inferior right upper lobe.  Are there old films for comparison?  This is worrisome for developing lung cancer.  In the absence of old studies, chest CT with contrast would be suggested.  These results will be called to the ordering clinician or representative by the Radiologist Assistant, and communication documented in the PACS Dashboard.  Original Report Authenticated By: Thomasenia Sales, M.D.   Ct Chest W Contrast  10/11/2011  *RADIOLOGY REPORT*  Clinical Data: Pulmonary nodule on chest radiography.  CT CHEST WITH CONTRAST  Technique:  Multidetector CT imaging of the chest was performed following the standard protocol during bolus administration of intravenous contrast.  Contrast: 80mL OMNIPAQUE IOHEXOL 300 MG/ML IV SOLN  Comparison: 10/11/2011  Findings: Left infrahilar node measures 0.8 cm in short axis.  A right infrahilar node measures 0.9 cm  in short axis.  An indistinct right hilar node measures 1.0 cm in short axis.  No paratracheal or AP window adenopathy is observed.  A 1.5 x 1.6 cm right upper lobe pulmonary nodule is present just above the minor fissure on image 29 of series 3, corresponding to the nodular density on chest radiography.  Subsegmental atelectasis is present in both lower lobes. Centrilobular emphysema is present.  Old healed right-sided rib fractures are noted.  A hypodense 5 mm lesion in the medial segment left hepatic lobe, image 50 of series 2, is probably a cyst but technically nonspecific.  Next phytic fluid density 3.9 x 3.0 cm lesion of the right kidney upper pole is compatible with cyst.  We partially visualize another right mid kidney cyst.  IMPRESSION:  1.  1.6 cm right upper lobe pulmonary nodule is based along the minor fissure, but remain suspicious for malignancy.  Consider tissue sampling or PET CT for further characterization. 2.  Borderline right hilar and borderline bilateral infrahilar adenopathy. 3.  Subsegmental atelectasis in both lower lobes. 4.  Right renal cysts; probable cyst in the medial segment left hepatic lobe. 5.  Emphysema.  Original Report Authenticated By: Dellia Cloud, M.D.   Medications: I have reviewed the patient's current medications. Scheduled Meds:   . amLODipine  10 mg Oral Daily  . aspirin  81 mg Oral Once  . aspirin EC  81 mg Oral Daily  . folic acid  1 mg Oral Daily  . heparin  5,000 Units Subcutaneous Q8H  . influenza  inactive virus vaccine  0.5 mL Intramuscular Once  . mulitivitamin with minerals  1 tablet Oral Daily  . nicotine  7 mg Transdermal Daily  . pneumococcal 23 valent vaccine  0.5 mL Intramuscular Tomorrow-1000  . general admission iv infusion   Intravenous Once  . sodium chloride  3 mL Intravenous Q12H  . thiamine  100 mg Oral Daily   Or  . thiamine  100 mg Intravenous Daily   Continuous Infusions:  PRN Meds:.alum & mag hydroxide-simeth,  iohexol, LORazepam, LORazepam, promethazine, promethazine Assessment/Plan:  Syncope - Has had no events on telemetry overnight, no problems overnight. No repeat episodes. Will be stable for discharge today.    Alcohol abuse - Was on CIWA, not requiring ativan.    HTN (hypertension) - BP is controlled. 134/82.    Active smoker - nodule on CXR which was evaluated on CT with contrast. We would recommend follow up with PET versus tissue sampling. Suspicious for malignancy. Spoke with patient about this and gave him our recommendations.   Disposition - Pt stable to go home today.  LOS: 1 day   Genella Mech 10/12/2011, 10:22 AM

## 2011-10-15 LAB — HIV-1 RNA QUANT-NO REFLEX-BLD: HIV 1 RNA Quant: <20 copies/mL (ref <20)

## 2011-10-21 NOTE — Discharge Summary (Signed)
I too explained the importance of follow-up for this pulmonary nodule given our concerns for malignancy.  He was not interested in any further evaluation as an inpatient.  We will therefore ask the primary care provider to get a CT scan of the thorax with contrast in 3 months, 6 months, 12 months, 18 months, and 2 years to assure stability of the lesion if he refuses PET scanning.  The risk of this lesion being cancerous is high given his age and smoking history so watchful waiting is less than an ideal mode of evaluation and this will need to be continued to be explained to Brendan Hooper.  If he chooses watchful waiting and the lesion grows on any of the follow-up CT scan more aggressive staging and definitive therapy (if appropriate) is strongly encouraged.

## 2014-08-06 ENCOUNTER — Emergency Department (HOSPITAL_COMMUNITY): Payer: Medicare Other

## 2014-08-06 ENCOUNTER — Inpatient Hospital Stay (HOSPITAL_COMMUNITY)
Admission: EM | Admit: 2014-08-06 | Discharge: 2014-08-11 | DRG: 987 | Disposition: A | Discharge status: Home / Self Care | Payer: Medicare Other | Attending: Internal Medicine | Admitting: Internal Medicine

## 2014-08-06 ENCOUNTER — Encounter (HOSPITAL_COMMUNITY): Payer: Self-pay | Admitting: *Deleted

## 2014-08-06 DIAGNOSIS — L02214 Cutaneous abscess of groin: Secondary | ICD-10-CM | POA: Diagnosis present

## 2014-08-06 DIAGNOSIS — E43 Unspecified severe protein-calorie malnutrition: Secondary | ICD-10-CM | POA: Diagnosis present

## 2014-08-06 DIAGNOSIS — K219 Gastro-esophageal reflux disease without esophagitis: Secondary | ICD-10-CM | POA: Diagnosis present

## 2014-08-06 DIAGNOSIS — F17210 Nicotine dependence, cigarettes, uncomplicated: Secondary | ICD-10-CM | POA: Diagnosis present

## 2014-08-06 DIAGNOSIS — R599 Enlarged lymph nodes, unspecified: Secondary | ICD-10-CM | POA: Insufficient documentation

## 2014-08-06 DIAGNOSIS — I120 Hypertensive chronic kidney disease with stage 5 chronic kidney disease or end stage renal disease: Secondary | ICD-10-CM | POA: Diagnosis present

## 2014-08-06 DIAGNOSIS — Z6821 Body mass index (BMI) 21.0-21.9, adult: Secondary | ICD-10-CM

## 2014-08-06 DIAGNOSIS — Z7901 Long term (current) use of anticoagulants: Secondary | ICD-10-CM | POA: Diagnosis not present

## 2014-08-06 DIAGNOSIS — R1909 Other intra-abdominal and pelvic swelling, mass and lump: Secondary | ICD-10-CM | POA: Diagnosis present

## 2014-08-06 DIAGNOSIS — R059 Cough, unspecified: Secondary | ICD-10-CM

## 2014-08-06 DIAGNOSIS — J189 Pneumonia, unspecified organism: Secondary | ICD-10-CM | POA: Diagnosis present

## 2014-08-06 DIAGNOSIS — L0291 Cutaneous abscess, unspecified: Secondary | ICD-10-CM

## 2014-08-06 DIAGNOSIS — R918 Other nonspecific abnormal finding of lung field: Secondary | ICD-10-CM | POA: Diagnosis present

## 2014-08-06 DIAGNOSIS — R05 Cough: Secondary | ICD-10-CM | POA: Diagnosis present

## 2014-08-06 DIAGNOSIS — I10 Essential (primary) hypertension: Secondary | ICD-10-CM | POA: Diagnosis present

## 2014-08-06 DIAGNOSIS — F17200 Nicotine dependence, unspecified, uncomplicated: Secondary | ICD-10-CM | POA: Diagnosis present

## 2014-08-06 DIAGNOSIS — E876 Hypokalemia: Secondary | ICD-10-CM | POA: Diagnosis present

## 2014-08-06 DIAGNOSIS — F1010 Alcohol abuse, uncomplicated: Secondary | ICD-10-CM | POA: Diagnosis present

## 2014-08-06 LAB — CBC WITH DIFFERENTIAL/PLATELET
BASOS ABS: 0 10*3/uL (ref 0.0–0.1)
BASOS PCT: 0 % (ref 0–1)
EOS ABS: 0.1 10*3/uL (ref 0.0–0.7)
EOS PCT: 0 % (ref 0–5)
HEMATOCRIT: 44.7 % (ref 39.0–52.0)
Hemoglobin: 16.1 g/dL (ref 13.0–17.0)
LYMPHS PCT: 17 % (ref 12–46)
Lymphs Abs: 2 10*3/uL (ref 0.7–4.0)
MCH: 27.5 pg (ref 26.0–34.0)
MCHC: 36 g/dL (ref 30.0–36.0)
MCV: 76.4 fL — AB (ref 78.0–100.0)
MONO ABS: 1.2 10*3/uL — AB (ref 0.1–1.0)
Monocytes Relative: 10 % (ref 3–12)
Neutro Abs: 8.5 10*3/uL — ABNORMAL HIGH (ref 1.7–7.7)
Neutrophils Relative %: 73 % (ref 43–77)
Platelets: 334 10*3/uL (ref 150–400)
RBC: 5.85 MIL/uL — ABNORMAL HIGH (ref 4.22–5.81)
RDW: 16.3 % — AB (ref 11.5–15.5)
WBC: 11.8 10*3/uL — ABNORMAL HIGH (ref 4.0–10.5)

## 2014-08-06 LAB — BASIC METABOLIC PANEL
ANION GAP: 16 — AB (ref 5–15)
BUN: 6 mg/dL (ref 6–23)
CALCIUM: 9.4 mg/dL (ref 8.4–10.5)
CO2: 26 meq/L (ref 19–32)
CREATININE: 0.57 mg/dL (ref 0.50–1.35)
Chloride: 93 mEq/L — ABNORMAL LOW (ref 96–112)
GFR calc Af Amer: >90 mL/min (ref >90)
Glucose, Bld: 127 mg/dL — ABNORMAL HIGH (ref 70–99)
Potassium: 3.6 mEq/L — ABNORMAL LOW (ref 3.7–5.3)
Sodium: 135 mEq/L — ABNORMAL LOW (ref 137–147)

## 2014-08-06 MED ORDER — VITAMIN B-1 100 MG PO TABS: 100.0000 mg | ORAL_TABLET | Freq: Every day | ORAL | Status: DC

## 2014-08-06 MED ORDER — IOHEXOL 300 MG/ML  SOLN
100.0000 mL | Freq: Once | INTRAMUSCULAR | Status: AC | PRN
  Administered 2014-08-06: 100 mL via INTRAVENOUS

## 2014-08-06 MED ORDER — FOLIC ACID 1 MG PO TABS
1.0000 mg | ORAL_TABLET | Freq: Every day | ORAL | Status: DC
  Filled 2014-08-06: qty 1

## 2014-08-06 MED ORDER — ADULT MULTIVITAMIN W/MINERALS CH
1.0000 | ORAL_TABLET | Freq: Every day | ORAL | Status: DC
  Filled 2014-08-06: qty 1

## 2014-08-06 MED ORDER — LORAZEPAM 2 MG/ML IJ SOLN: 1.0000 mg | Freq: Four times a day (QID) | INTRAMUSCULAR | Status: DC | PRN

## 2014-08-06 MED ORDER — DEXTROSE 5 % IV SOLN
1.0000 g | Freq: Once | INTRAVENOUS | Status: AC
  Administered 2014-08-06: 1 g via INTRAVENOUS
  Filled 2014-08-06: qty 10

## 2014-08-06 MED ORDER — LORAZEPAM 1 MG PO TABS: 1.0000 mg | ORAL_TABLET | Freq: Four times a day (QID) | ORAL | Status: DC | PRN

## 2014-08-06 MED ORDER — THIAMINE HCL 100 MG/ML IJ SOLN: 100.0000 mg | Freq: Every day | INTRAMUSCULAR | Status: DC

## 2014-08-06 MED ORDER — DEXTROSE 5 % IV SOLN
500.0000 mg | Freq: Once | INTRAVENOUS | Status: AC
Start: 2014-08-06 — End: 2014-08-07
  Administered 2014-08-07: 500 mg via INTRAVENOUS
  Filled 2014-08-06: qty 500

## 2014-08-06 NOTE — ED Provider Notes (Signed)
MSE was initiated and I personally evaluated the patient and placed orders (if any) at  8:14 PM on August 06, 2014.  The patient appears stable so that the remainder of the MSE may be completed by another provider.  Brendan Hooper is a 68 y.o. male who presents to the Emergency Department complaining of a knot in his right groin area that he noticed a few days ago. He states it only hurts when he tries to get up out of bed. Pt reports he has some occasional diarrhea, and has had a cough recently. He reports he has been taking Advil PM which has helped provide some relief to his symptoms. Pt denies pain with urinating, CP, fevers or chills, vomiting, testicular swelling or penile swelling. Pt reports no pertinent past medical history at this time. He reports NKDA.   Large swelling to right inguinal area. No fluctuance or induration. No erythema or warmth to suggest infection. GU exam otherwise unremarkable. Discussed patient with Dr. Aline Brochure who recommends CT with contrast for further evaluation of groin swelling. Will also obtain CXR for evaluation of cough.   Patient d/w with Dr. Aline Brochure, agrees with plan.      Harlow Mares, PA-C 08/06/14 2036  Pamella Pert, MD 08/07/14 1622

## 2014-08-06 NOTE — ED Provider Notes (Addendum)
CSN: [id number]     Arrival date & time 08/06/14  1916 History   First MD Initiated Contact with Patient 08/06/14 1956     Chief Complaint  Patient presents with  . URI     (Consider location/radiation/quality/duration/timing/severity/associated sxs/prior Treatment) HPI Complains of nonpainful mass to right anterior thigh first noted 8 days ago.. No treatment prior to coming here. He also admits to cough and sneeze and rhinorrhea for the past one week. No treatment prior to coming here no other associated symptoms. Nothing makes symptoms better or worse. Past Medical History  Diagnosis Date  . Hypertension   . GERD (gastroesophageal reflux disease)   . Headache(784.0)   . Sickle cell anemia    Past Surgical History  Procedure Laterality Date  . No past surgeries     No family history on file. History  Substance Use Topics  . Smoking status: Current Every Day Smoker -- 1.00 packs/day for 40 years    Types: Cigarettes  . Smokeless tobacco: Never Used  . Alcohol Use: 1.2 oz/week    2 Shots of liquor per week     Comment: 2 to 3 days a week    48 beers per week  no drug use Review of Systems  Constitutional: Negative.   HENT: Positive for rhinorrhea and sneezing.   Respiratory: Positive for cough.   Cardiovascular: Negative.   Gastrointestinal: Negative.   Musculoskeletal: Negative.   Skin: Positive for wound.       Mass at right groin  Neurological: Negative.   Psychiatric/Behavioral: Negative.       Allergies  Review of patient's allergies indicates no known allergies.  Home Medications   Prior to Admission medications   Medication Sig Start Date End Date Taking? Authorizing Provider  amLODipine (NORVASC) 10 MG tablet Take 10 mg by mouth daily.    Historical Provider, MD  hydrochlorothiazide (HYDRODIURIL) 25 MG tablet Take 25 mg by mouth daily.    Historical Provider, MD   BP 133/76 mmHg  Pulse 101  Temp(Src) 99.5 F (37.5 C) (Oral)  Resp 20  Ht 6'  (1.829 m)  Wt 158 lb 4.8 oz (71.804 kg)  BMI 21.46 kg/m2  SpO2 93% Physical Exam  Constitutional: No distress.  Cachectic, chronically ill-appearing  HENT:  Head: Normocephalic and atraumatic.  Eyes: Conjunctivae are normal. Pupils are equal, round, and reactive to light.  Neck: Neck supple. No tracheal deviation present. No thyromegaly present.  Cardiovascular: Normal rate and regular rhythm.   No murmur heard. Pulmonary/Chest: Effort normal and breath sounds normal.  Abdominal: Soft. Bowel sounds are normal. He exhibits no distension. There is no tenderness.  Genitourinary:  Normal male.  Musculoskeletal: Normal range of motion. He exhibits no edema or tenderness.  Right lower extremity there is a golf ball sized mass immediately below the inguinal crease which is red with redness extending approximately 5 cm beyond the diameter of the mass. Mildly warm. Nontender. No fluctuance. Not mobile.   Neurological: He is alert. Coordination normal.  Skin: Skin is warm and dry. No rash noted.  Psychiatric: He has a normal mood and affect.  Nursing note and vitals reviewed.   ED Course  Procedures (including critical care time) Labs Review Labs Reviewed  CBC WITH DIFFERENTIAL - Abnormal; Notable for the following:    WBC 11.8 (*)    RBC 5.85 (*)    MCV 76.4 (*)    RDW 16.3 (*)    Neutro Abs 8.5 (*)    Monocytes  Absolute 1.2 (*)    All other components within normal limits  BASIC METABOLIC PANEL    Imaging Review No results found.   EKG Interpretation None     Chest x-ray viewed by me Results for orders placed or performed during the hospital encounter of 08/06/14  CBC with Differential  Result Value Ref Range   WBC 11.8 (H) 4.0 - 10.5 K/uL   RBC 5.85 (H) 4.22 - 5.81 MIL/uL   Hemoglobin 16.1 13.0 - 17.0 g/dL   HCT 44.7 39.0 - 52.0 %   MCV 76.4 (L) 78.0 - 100.0 fL   MCH 27.5 26.0 - 34.0 pg   MCHC 36.0 30.0 - 36.0 g/dL   RDW 16.3 (H) 11.5 - 15.5 %   Platelets 334 150 -  400 K/uL   Neutrophils Relative % 73 43 - 77 %   Neutro Abs 8.5 (H) 1.7 - 7.7 K/uL   Lymphocytes Relative 17 12 - 46 %   Lymphs Abs 2.0 0.7 - 4.0 K/uL   Monocytes Relative 10 3 - 12 %   Monocytes Absolute 1.2 (H) 0.1 - 1.0 K/uL   Eosinophils Relative 0 0 - 5 %   Eosinophils Absolute 0.1 0.0 - 0.7 K/uL   Basophils Relative 0 0 - 1 %   Basophils Absolute 0.0 0.0 - 0.1 K/uL  Basic metabolic panel  Result Value Ref Range   Sodium 135 (L) 137 - 147 mEq/L   Potassium 3.6 (L) 3.7 - 5.3 mEq/L   Chloride 93 (L) 96 - 112 mEq/L   CO2 26 19 - 32 mEq/L   Glucose, Bld 127 (H) 70 - 99 mg/dL   BUN 6 6 - 23 mg/dL   Creatinine, Ser 0.57 0.50 - 1.35 mg/dL   Calcium 9.4 8.4 - 10.5 mg/dL   GFR calc non Af Amer >90 >90 mL/min   GFR calc Af Amer >90 >90 mL/min   Anion gap 16 (H) 5 - 15   Dg Chest 2 View  08/06/2014   CLINICAL DATA:  Acute onset of productive cough for 2 weeks. Current history of smoking. Initial encounter.  EXAM: CHEST  2 VIEW  COMPARISON:  Chest radiograph and CT of the chest performed 10/11/2011  FINDINGS: There is masslike consolidation and expansion of the right middle lobe. Given the prior pulmonary nodule within the right upper lobe at the right minor fissure in 2013, this is suspicious for a large 9.7 cm mass and adjacent postobstructive pneumonia.  The left lung appears relatively clear, aside from mildly increased interstitial markings, likely chronic in nature. No pleural effusion or pneumothorax is seen.  The heart remains normal in size. No acute osseous abnormalities are identified.  IMPRESSION: Masslike consolidation and expansion at the right middle lobe. Given the prior pulmonary nodule within the right upper lobe at the right minor fissure in 2013, this is suspicious for a large 9.7 cm mass and adjacent postobstructive pneumonia. CT of the chest would be helpful for further evaluation, when and as deemed clinically appropriate.   Electronically Signed   By: Garald Balding M.D.    On: 08/06/2014 21:27   Ct Hip Right W Contrast  08/06/2014   CLINICAL DATA:  Significant right inguinal swelling over the past few days. Initial encounter.  EXAM: CT OF THE RIGHT HIP WITH CONTRAST  TECHNIQUE: Multidetector CT imaging was performed following the standard protocol during bolus administration of intravenous contrast.  CONTRAST:  175mL OMNIPAQUE IOHEXOL 300 MG/ML  SOLN  COMPARISON:  None.  FINDINGS: There is a large 4.6 x 3.8 x 4.0 cm peripherally enhancing collection of fluid at the right inguinal region. This may reflect abscess arising from a centrally necrotic node. This may reflect underlying infection extending up the right lower extremity, or focal right inguinal infection. Overlying soft tissue inflammation is seen, with associated skin thickening extending along the proximal anterior right thigh.  There is mildly asymmetric enhancement of additional smaller right inguinal nodes. There is no evidence of vascular compromise. The underlying vasculature is unremarkable in appearance.  The abscess abuts the adjacent quadriceps musculature. There is soft tissue inflammation along the fascia of the anterior right thigh.  No acute osseous abnormalities are seen.  Visualized small large bowel loops are grossly unremarkable. The bladder is mildly distended and unremarkable in appearance. A small amount of free fluid within the pelvis is of uncertain significance. The prostate remains normal in size.  IMPRESSION: 1. Large 4.6 x 3.8 x 4.0 cm peripherally enhancing collection of fluid at the right inguinal region. This may reflect an abscess arising from a centrally necrotic node, due to underlying infection extending up the right lower extremity or focal right inguinal infection. Overlying soft tissue inflammation, with associated skin thickening extending along the proximal anterior right thigh. 2. Mildly asymmetric enhancement of the smaller right inguinal nodes. No evidence of vascular compromise.  3. Abscess abuts the adjacent quadriceps musculature, with soft tissue inflammation along the fascia of the anterior right thigh. 4. Small amount of free fluid within the pelvis, of uncertain significance.   Electronically Signed   By: Garald Balding M.D.   On: 08/06/2014 23:00    MDM  Spoke with Dr. Grandville Silos who will evaluate abscess incision and drainage Hospitalist team consult for admission. Spoke with Dr.Patel plan admit MedSurg floor, intravenous antibiotics Patient is at risk for alcohol withdrawl Diagnosis #1 postobstructive pneumonia #2 groin abscess #3alcohol abuse #4 tobacco abuse Final diagnoses:  Cough  Groin swelling        Orlie Dakin, MD 08/06/14 [id number]  Orlie Dakin, MD 08/06/14 2343

## 2014-08-06 NOTE — ED Notes (Addendum)
Patient presents with cold symptoms for about 4 weeks.  Has been taking meds for his cold.  Also has noticed a "lump" to the right groin area.  States it only hurts when he tries to get up out of the bed

## 2014-08-07 ENCOUNTER — Encounter (HOSPITAL_COMMUNITY): Payer: Self-pay | Admitting: *Deleted

## 2014-08-07 DIAGNOSIS — L02214 Cutaneous abscess of groin: Secondary | ICD-10-CM

## 2014-08-07 DIAGNOSIS — R591 Generalized enlarged lymph nodes: Secondary | ICD-10-CM

## 2014-08-07 DIAGNOSIS — Z720 Tobacco use: Secondary | ICD-10-CM

## 2014-08-07 DIAGNOSIS — F1010 Alcohol abuse, uncomplicated: Secondary | ICD-10-CM

## 2014-08-07 DIAGNOSIS — R918 Other nonspecific abnormal finding of lung field: Secondary | ICD-10-CM

## 2014-08-07 DIAGNOSIS — I10 Essential (primary) hypertension: Secondary | ICD-10-CM

## 2014-08-07 DIAGNOSIS — J189 Pneumonia, unspecified organism: Secondary | ICD-10-CM | POA: Insufficient documentation

## 2014-08-07 LAB — CBC WITH DIFFERENTIAL/PLATELET
BASOS ABS: 0 10*3/uL (ref 0.0–0.1)
Basophils Relative: 0 % (ref 0–1)
Eosinophils Absolute: 0.1 10*3/uL (ref 0.0–0.7)
Eosinophils Relative: 1 % (ref 0–5)
HEMATOCRIT: 41.2 % (ref 39.0–52.0)
Hemoglobin: 14.7 g/dL (ref 13.0–17.0)
LYMPHS PCT: 17 % (ref 12–46)
Lymphs Abs: 1.8 10*3/uL (ref 0.7–4.0)
MCH: 27 pg (ref 26.0–34.0)
MCHC: 35.7 g/dL (ref 30.0–36.0)
MCV: 75.7 fL — ABNORMAL LOW (ref 78.0–100.0)
MONO ABS: 1.2 10*3/uL — AB (ref 0.1–1.0)
Monocytes Relative: 12 % (ref 3–12)
NEUTROS ABS: 7 10*3/uL (ref 1.7–7.7)
Neutrophils Relative %: 70 % (ref 43–77)
Platelets: 312 10*3/uL (ref 150–400)
RBC: 5.44 MIL/uL (ref 4.22–5.81)
RDW: 16.2 % — ABNORMAL HIGH (ref 11.5–15.5)
WBC: 10 10*3/uL (ref 4.0–10.5)

## 2014-08-07 LAB — COMPREHENSIVE METABOLIC PANEL
ALK PHOS: 80 U/L (ref 39–117)
ALT: 9 U/L (ref 0–53)
AST: 23 U/L (ref 0–37)
Albumin: 2.5 g/dL — ABNORMAL LOW (ref 3.5–5.2)
Anion gap: 16 — ABNORMAL HIGH (ref 5–15)
BILIRUBIN TOTAL: 1 mg/dL (ref 0.3–1.2)
BUN: 5 mg/dL — ABNORMAL LOW (ref 6–23)
CHLORIDE: 95 meq/L — AB (ref 96–112)
CO2: 25 meq/L (ref 19–32)
Calcium: 9 mg/dL (ref 8.4–10.5)
Creatinine, Ser: 0.46 mg/dL — ABNORMAL LOW (ref 0.50–1.35)
GFR calc Af Amer: >90 mL/min (ref >90)
Glucose, Bld: 100 mg/dL — ABNORMAL HIGH (ref 70–99)
Potassium: 3.6 mEq/L — ABNORMAL LOW (ref 3.7–5.3)
Sodium: 136 mEq/L — ABNORMAL LOW (ref 137–147)
Total Protein: 7 g/dL (ref 6.0–8.3)

## 2014-08-07 LAB — PROTIME-INR
INR: 1.25 (ref 0.00–1.49)
Prothrombin Time: 15.8 seconds — ABNORMAL HIGH (ref 11.6–15.2)

## 2014-08-07 LAB — HIV ANTIBODY (ROUTINE TESTING W REFLEX): HIV 1&2 Ab, 4th Generation: NONREACTIVE

## 2014-08-07 MED ORDER — FOLIC ACID 1 MG PO TABS
1.0000 mg | ORAL_TABLET | Freq: Every day | ORAL | Status: DC
  Administered 2014-08-07 – 2014-08-11 (×5): 1 mg via ORAL
  Filled 2014-08-07 (×5): qty 1

## 2014-08-07 MED ORDER — BENZONATATE 100 MG PO CAPS
100.0000 mg | ORAL_CAPSULE | Freq: Three times a day (TID) | ORAL | Status: DC | PRN
  Administered 2014-08-07 – 2014-08-10 (×7): 100 mg via ORAL
  Filled 2014-08-07 (×8): qty 1

## 2014-08-07 MED ORDER — INFLUENZA VAC SPLIT QUAD 0.5 ML IM SUSY
0.5000 mL | PREFILLED_SYRINGE | INTRAMUSCULAR | Status: AC
  Administered 2014-08-08: 0.5 mL via INTRAMUSCULAR
  Filled 2014-08-07: qty 0.5

## 2014-08-07 MED ORDER — THIAMINE HCL 100 MG/ML IJ SOLN: 100.0000 mg | Freq: Every day | INTRAMUSCULAR | Status: DC

## 2014-08-07 MED ORDER — LORAZEPAM 1 MG PO TABS: 0.0000 mg | ORAL_TABLET | Freq: Four times a day (QID) | ORAL | Status: AC

## 2014-08-07 MED ORDER — LORAZEPAM 1 MG PO TABS: 0.0000 mg | ORAL_TABLET | Freq: Two times a day (BID) | ORAL | Status: AC

## 2014-08-07 MED ORDER — DEXTROSE 5 % IV SOLN
500.0000 mg | INTRAVENOUS | Status: DC
  Administered 2014-08-07 – 2014-08-08 (×2): 500 mg via INTRAVENOUS
  Filled 2014-08-07 (×3): qty 500

## 2014-08-07 MED ORDER — CEFTRIAXONE SODIUM IN DEXTROSE 20 MG/ML IV SOLN
1.0000 g | INTRAVENOUS | Status: DC
  Administered 2014-08-07 – 2014-08-08 (×2): 1 g via INTRAVENOUS
  Filled 2014-08-07 (×3): qty 50

## 2014-08-07 MED ORDER — VITAMIN B-1 100 MG PO TABS
100.0000 mg | ORAL_TABLET | Freq: Every day | ORAL | Status: DC
  Administered 2014-08-07 – 2014-08-11 (×5): 100 mg via ORAL
  Filled 2014-08-07 (×5): qty 1

## 2014-08-07 MED ORDER — SODIUM CHLORIDE 0.9 % IV SOLN
INTRAVENOUS | Status: DC
Start: 2014-08-07 — End: 2014-08-07
  Administered 2014-08-07 (×2): via INTRAVENOUS

## 2014-08-07 MED ORDER — SODIUM CHLORIDE 0.9 % IV SOLN
INTRAVENOUS | Status: DC
  Administered 2014-08-07: 01:00:00 via INTRAVENOUS

## 2014-08-07 MED ORDER — LORAZEPAM 1 MG PO TABS: 1.0000 mg | ORAL_TABLET | Freq: Four times a day (QID) | ORAL | Status: DC | PRN

## 2014-08-07 MED ORDER — ADULT MULTIVITAMIN W/MINERALS CH
1.0000 | ORAL_TABLET | Freq: Every day | ORAL | Status: DC
  Administered 2014-08-07 – 2014-08-11 (×5): 1 via ORAL
  Filled 2014-08-07 (×5): qty 1

## 2014-08-07 MED ORDER — HEPARIN SODIUM (PORCINE) 5000 UNIT/ML IJ SOLN
5000.0000 [IU] | Freq: Three times a day (TID) | INTRAMUSCULAR | Status: DC
  Administered 2014-08-07 – 2014-08-08 (×4): 5000 [IU] via SUBCUTANEOUS
  Filled 2014-08-07 (×7): qty 1

## 2014-08-07 MED ORDER — NICOTINE 21 MG/24HR TD PT24
21.0000 mg | MEDICATED_PATCH | Freq: Every day | TRANSDERMAL | Status: DC
  Administered 2014-08-07 – 2014-08-11 (×5): 21 mg via TRANSDERMAL
  Filled 2014-08-07 (×5): qty 1

## 2014-08-07 MED ORDER — LORAZEPAM 2 MG/ML IJ SOLN: 1.0000 mg | Freq: Four times a day (QID) | INTRAMUSCULAR | Status: DC | PRN

## 2014-08-07 NOTE — H&P (Signed)
[facility] History and Physical  Patient: Brendan Hooper  GMW:[id number]  DOB: July 29, 1946  DOS: the patient was seen and examined on 08/07/2014 PCP: Antonietta Jewel, MD  Chief Complaint: Cough  HPI: Brendan Hooper is a 68 y.o. male with Past medical history of hypertension, GERD, pulmonary nodule. Patient presents with complaints of cough that has been ongoing for last few days associated with fever and chills. He does not bring up any phlegm. Does not have any chest pain or shortness of breath. Denies any nausea vomiting or aspiration. Denies any diarrhea constipation abdominal pain or burning urination or leg swelling or recent travel. He does not take any medication. He is an active smoker and drinks 2-4 16 ounces here in a daily basis. He was diagnosed with a pulmonary nodule in 2013 but he refused further workup and was recommended to follow-up as an outpatient with an repeat CT scan but he has not followed up with any physician since then.  The patient is coming from home. And at his baseline independent for most of his ADL.  Review of Systems: as mentioned in the history of present illness.  A Comprehensive review of the other systems is negative.  Past Medical History  Diagnosis Date  . Hypertension   . GERD (gastroesophageal reflux disease)   . Headache(784.0)   . Sickle cell anemia    Past Surgical History  Procedure Laterality Date  . No past surgeries     Social History:  reports that he has been smoking Cigarettes.  He has a 20 pack-year smoking history. He has never used smokeless tobacco. He reports that he drinks alcohol. He reports that he does not use illicit drugs.  No Known Allergies  History reviewed. No pertinent family history.  Prior to Admission medications   Medication Sig Start Date End Date Taking? Authorizing Provider  naproxen sodium (ALEVE) 220 MG tablet Take 220 mg by mouth daily as needed (pain).   Yes Historical Provider, MD   Phenyleph-Doxylamine-DM-APAP (ALKA SELTZER PLUS PO) Take 1 tablet by mouth daily as needed (cold symptome).   Yes Historical Provider, MD  Pseudoeph-Doxylamine-DM-APAP (NYQUIL PO) Take 1 tablet by mouth at bedtime as needed (cold symptoms).   Yes Historical Provider, MD    Physical Exam: Filed Vitals:   08/07/14 0104 08/07/14 0111 08/07/14 0500 08/07/14 0609  BP:  148/75  128/68  Pulse:    92  Temp:  99 F (37.2 C)  99.5 F (37.5 C)  TempSrc:  Oral  Oral  Resp:  18  18  Height: 6' (1.829 m)     Weight: 68.811 kg (151 lb 11.2 oz)  69.128 kg (152 lb 6.4 oz)   SpO2:  100%  94%    General: Alert, Awake and Oriented to Time, Place and Person. Appear in mild distress Eyes: PERRL ENT: Oral Mucosa clear moist. Neck: no JVD Cardiovascular: S1 and S2 Present, no Murmur, Peripheral Pulses Present Respiratory: Bilateral Air entry equal and Decreased, right-sided Crackles, upper airway wheezes Abdomen: Bowel Sound present, Soft and non- tender Skin: No Rash Extremities: No Pedal edema, no calf tenderness, right groin swelling with redness and warmth and tenderness Neurologic: Grossly no focal neuro deficit.  Labs on Admission:  CBC:  Recent Labs Lab 08/06/14 2026 08/07/14 0420  WBC 11.8* 10.0  NEUTROABS 8.5* 7.0  HGB 16.1 14.7  HCT 44.7 41.2  MCV 76.4* 75.7*  PLT 334 312    CMP     Component Value Date/Time   NA 136*  08/07/2014 0420   K 3.6* 08/07/2014 0420   CL 95* 08/07/2014 0420   CO2 25 08/07/2014 0420   GLUCOSE 100* 08/07/2014 0420   BUN 5* 08/07/2014 0420   CREATININE 0.46* 08/07/2014 0420   CALCIUM 9.0 08/07/2014 0420   PROT 7.0 08/07/2014 0420   ALBUMIN 2.5* 08/07/2014 0420   AST 23 08/07/2014 0420   ALT 9 08/07/2014 0420   ALKPHOS 80 08/07/2014 0420   BILITOT 1.0 08/07/2014 0420   GFRNONAA >90 08/07/2014 0420   GFRAA >90 08/07/2014 0420    No results for input(s): LIPASE, AMYLASE in the last 168 hours. No results for input(s): AMMONIA in the last 168  hours.  No results for input(s): CKTOTAL, CKMB, CKMBINDEX, TROPONINI in the last 168 hours. BNP (last 3 results) No results for input(s): PROBNP in the last 8760 hours.  Radiological Exams on Admission: Dg Chest 2 View  08/06/2014   CLINICAL DATA:  Acute onset of productive cough for 2 weeks. Current history of smoking. Initial encounter.  EXAM: CHEST  2 VIEW  COMPARISON:  Chest radiograph and CT of the chest performed 10/11/2011  FINDINGS: There is masslike consolidation and expansion of the right middle lobe. Given the prior pulmonary nodule within the right upper lobe at the right minor fissure in 2013, this is suspicious for a large 9.7 cm mass and adjacent postobstructive pneumonia.  The left lung appears relatively clear, aside from mildly increased interstitial markings, likely chronic in nature. No pleural effusion or pneumothorax is seen.  The heart remains normal in size. No acute osseous abnormalities are identified.  IMPRESSION: Masslike consolidation and expansion at the right middle lobe. Given the prior pulmonary nodule within the right upper lobe at the right minor fissure in 2013, this is suspicious for a large 9.7 cm mass and adjacent postobstructive pneumonia. CT of the chest would be helpful for further evaluation, when and as deemed clinically appropriate.   Electronically Signed   By: Garald Balding M.D.   On: 08/06/2014 21:27   Ct Hip Right W Contrast  08/06/2014   CLINICAL DATA:  Significant right inguinal swelling over the past few days. Initial encounter.  EXAM: CT OF THE RIGHT HIP WITH CONTRAST  TECHNIQUE: Multidetector CT imaging was performed following the standard protocol during bolus administration of intravenous contrast.  CONTRAST:  173mL OMNIPAQUE IOHEXOL 300 MG/ML  SOLN  COMPARISON:  None.  FINDINGS: There is a large 4.6 x 3.8 x 4.0 cm peripherally enhancing collection of fluid at the right inguinal region. This may reflect abscess arising from a centrally necrotic  node. This may reflect underlying infection extending up the right lower extremity, or focal right inguinal infection. Overlying soft tissue inflammation is seen, with associated skin thickening extending along the proximal anterior right thigh.  There is mildly asymmetric enhancement of additional smaller right inguinal nodes. There is no evidence of vascular compromise. The underlying vasculature is unremarkable in appearance.  The abscess abuts the adjacent quadriceps musculature. There is soft tissue inflammation along the fascia of the anterior right thigh.  No acute osseous abnormalities are seen.  Visualized small large bowel loops are grossly unremarkable. The bladder is mildly distended and unremarkable in appearance. A small amount of free fluid within the pelvis is of uncertain significance. The prostate remains normal in size.  IMPRESSION: 1. Large 4.6 x 3.8 x 4.0 cm peripherally enhancing collection of fluid at the right inguinal region. This may reflect an abscess arising from a centrally necrotic node, due  to underlying infection extending up the right lower extremity or focal right inguinal infection. Overlying soft tissue inflammation, with associated skin thickening extending along the proximal anterior right thigh. 2. Mildly asymmetric enhancement of the smaller right inguinal nodes. No evidence of vascular compromise. 3. Abscess abuts the adjacent quadriceps musculature, with soft tissue inflammation along the fascia of the anterior right thigh. 4. Small amount of free fluid within the pelvis, of uncertain significance.   Electronically Signed   By: Garald Balding M.D.   On: 08/06/2014 23:00    Assessment/Plan Principal Problem:   CAP (community acquired pneumonia) Active Problems:   Alcohol abuse   HTN (hypertension)   Current smoker   Postobstructive pneumonia   Lung mass   1. CAP (community acquired pneumonia) The patient is presenting with complaints of cough. Chest x-ray  shows large lung mass with possible postobstructive pneumonia. I discussed with patient at length about possibility of the lung mass and recommended him to obtain an imaging for further workup. Patient is adamantly refusing to obtain imaging. I offered consultation with pulmonary for which she was not sure. His main worry is pain related to the procedure. Currently with possibility of postobstructive pneumonia the patient will be treated with antibiotics and I will obtain further workup including sputum culture as well as urine antigens. Gentle IV hydration. I would still keep the patient nothing by mouth for possible procedure if he changes his mind in the morning.  2. Alcohol abuse. Patient drinks significant alcohol or daily basis. At present on CIWA protocol,   3. Right groin swelling General surgery evaluated the patient will be following up with the patient. The patient agrees for further workup related to his lung mass this lesion may require a biopsy.  Advance goals of care discussion: Full code at present   Consults: Gen. surgery, appreciate their input  DVT Prophylaxis: subcutaneous Heparinn Nutrition: Nothing by mouth except medication  Disposition: Admitted to inpatient in med-surge unit.  Author: Berle Mull, MD [facility] Pager: [phone] 08/07/2014, 6:39 AM    If 7PM-7AM, please contact night-coverage [email or web address] Password [email or web address]

## 2014-08-07 NOTE — Progress Notes (Addendum)
PROGRESS NOTE    Brendan Hooper DXA:[id number] DOB: 11/09/1945 DOA: 08/06/2014 PCP: Antonietta Jewel, MD  HPI/Brief narrative 68 year old male with history of hypertension, GERD, sickle cell anemia, ongoing tobacco abuse, evaluated in 2013 for a lung nodule and declined further evaluation, presented with cough, fever and chills. Chest x-ray reveals enlarging right middle lobe mass and postobstructive pneumonia. CT right hip shows large right inguinal region mass. Surgery and pulmonology have consulted. Plans for biopsy of right inguinal mass/lymph node.   Assessment/Plan:  1. Enlarging right-sided lung mass/likely malignant: Pulmonology consultation appreciated and would like to start off with core biopsy of large right inguinal lymph node-ordered per IR. 2. Postobstructive pneumonia: Continue IV Rocephin and azithromycin. 3. Large right inguinal mass: Surgical consultation appreciated. Likely represents necrotic lymph node. Surgeons were unable to aspirate any fluid. For Biopsy by IR 4. Essential hypertension: Controlled 5. Tobacco abuse: Cessation counseled. Continue nicotine patch. 6. Possible alcohol abuse: Continue Ativan protocol. No overt withdrawal features currently.   Code Status: Full Family Communication: None at bedside and patient declines MD's offer to contact family at present time. Disposition Plan: Home when medically stable.   Consultants:  [facility]  Pulmonology  Procedures:  None  Antibiotics:  IV Rocephin 12/20>  IV Azithromycin 12/20 >   Subjective: Denies complaints. Denies chest pain, cough or dyspnea. Denies pain in the right groin at this time.  Objective: Filed Vitals:   08/07/14 0111 08/07/14 0500 08/07/14 0609 08/07/14 1118  BP: 148/75  128/68 140/73  Pulse:   92 96  Temp: 99 F (37.2 C)  99.5 F (37.5 C) 98.4 F (36.9 C)  TempSrc: Oral  Oral Oral  Resp: 18  18 20   Height:      Weight:  69.128 kg (152 lb 6.4 oz)    SpO2: 100%  94% 99%     Intake/Output Summary (Last 24 hours) at 08/07/14 1521 Last data filed at 08/07/14 0900  Gross per 24 hour  Intake 498.34 ml  Output    200 ml  Net 298.34 ml   Filed Weights   08/06/14 1925 08/07/14 0104 08/07/14 0500  Weight: 71.804 kg (158 lb 4.8 oz) 68.811 kg (151 lb 11.2 oz) 69.128 kg (152 lb 6.4 oz)     Exam:  General exam: Moderately built and nourished middle-age male lying comfortably in bed. Respiratory system: Diminished breath sounds bilaterally without rhonchi, wheezing or crackles. No increased work of breathing. Cardiovascular system: S1 & S2 heard, RRR. No JVD, murmurs, gallops, clicks or pedal edema. Gastrointestinal system: Abdomen is nondistended, soft and nontender. Normal bowel sounds heard. Central nervous system: Alert and oriented. No focal neurological deficits. Extremities: Symmetric 5 x 5 power. Large right inguinal hard mass which is nonpulsatile or fluctuant. Not tender.   Data Reviewed: Basic Metabolic Panel:  Recent Labs Lab 08/06/14 2026 08/07/14 0420  NA 135* 136*  K 3.6* 3.6*  CL 93* 95*  CO2 26 25  GLUCOSE 127* 100*  BUN 6 5*  CREATININE 0.57 0.46*  CALCIUM 9.4 9.0   Liver Function Tests:  Recent Labs Lab 08/07/14 0420  AST 23  ALT 9  ALKPHOS 80  BILITOT 1.0  PROT 7.0  ALBUMIN 2.5*   No results for input(s): LIPASE, AMYLASE in the last 168 hours. No results for input(s): AMMONIA in the last 168 hours. CBC:  Recent Labs Lab 08/06/14 2026 08/07/14 0420  WBC 11.8* 10.0  NEUTROABS 8.5* 7.0  HGB 16.1 14.7  HCT 44.7 41.2  MCV 76.4* 75.7*  PLT 334 312   Cardiac Enzymes: No results for input(s): CKTOTAL, CKMB, CKMBINDEX, TROPONINI in the last 168 hours. BNP (last 3 results) No results for input(s): PROBNP in the last 8760 hours. CBG: No results for input(s): GLUCAP in the last 168 hours.  No results found for this or any previous visit (from the past 240 hour(s)).        Studies: Dg Chest 2  View  08/06/2014   CLINICAL DATA:  Acute onset of productive cough for 2 weeks. Current history of smoking. Initial encounter.  EXAM: CHEST  2 VIEW  COMPARISON:  Chest radiograph and CT of the chest performed 10/11/2011  FINDINGS: There is masslike consolidation and expansion of the right middle lobe. Given the prior pulmonary nodule within the right upper lobe at the right minor fissure in 2013, this is suspicious for a large 9.7 cm mass and adjacent postobstructive pneumonia.  The left lung appears relatively clear, aside from mildly increased interstitial markings, likely chronic in nature. No pleural effusion or pneumothorax is seen.  The heart remains normal in size. No acute osseous abnormalities are identified.  IMPRESSION: Masslike consolidation and expansion at the right middle lobe. Given the prior pulmonary nodule within the right upper lobe at the right minor fissure in 2013, this is suspicious for a large 9.7 cm mass and adjacent postobstructive pneumonia. CT of the chest would be helpful for further evaluation, when and as deemed clinically appropriate.   Electronically Signed   By: Garald Balding M.D.   On: 08/06/2014 21:27   Ct Hip Right W Contrast  08/06/2014   CLINICAL DATA:  Significant right inguinal swelling over the past few days. Initial encounter.  EXAM: CT OF THE RIGHT HIP WITH CONTRAST  TECHNIQUE: Multidetector CT imaging was performed following the standard protocol during bolus administration of intravenous contrast.  CONTRAST:  192mL OMNIPAQUE IOHEXOL 300 MG/ML  SOLN  COMPARISON:  None.  FINDINGS: There is a large 4.6 x 3.8 x 4.0 cm peripherally enhancing collection of fluid at the right inguinal region. This may reflect abscess arising from a centrally necrotic node. This may reflect underlying infection extending up the right lower extremity, or focal right inguinal infection. Overlying soft tissue inflammation is seen, with associated skin thickening extending along the proximal  anterior right thigh.  There is mildly asymmetric enhancement of additional smaller right inguinal nodes. There is no evidence of vascular compromise. The underlying vasculature is unremarkable in appearance.  The abscess abuts the adjacent quadriceps musculature. There is soft tissue inflammation along the fascia of the anterior right thigh.  No acute osseous abnormalities are seen.  Visualized small large bowel loops are grossly unremarkable. The bladder is mildly distended and unremarkable in appearance. A small amount of free fluid within the pelvis is of uncertain significance. The prostate remains normal in size.  IMPRESSION: 1. Large 4.6 x 3.8 x 4.0 cm peripherally enhancing collection of fluid at the right inguinal region. This may reflect an abscess arising from a centrally necrotic node, due to underlying infection extending up the right lower extremity or focal right inguinal infection. Overlying soft tissue inflammation, with associated skin thickening extending along the proximal anterior right thigh. 2. Mildly asymmetric enhancement of the smaller right inguinal nodes. No evidence of vascular compromise. 3. Abscess abuts the adjacent quadriceps musculature, with soft tissue inflammation along the fascia of the anterior right thigh. 4. Small amount of free fluid within the pelvis, of uncertain significance.   Electronically Signed  By: Garald Balding M.D.   On: 08/06/2014 23:00        Scheduled Meds: . folic acid  1 mg Oral Daily  . heparin  5,000 Units Subcutaneous 3 times per day  . [START ON 08/08/2014] Influenza vac split quadrivalent PF  0.5 mL Intramuscular Tomorrow-1000  . LORazepam  0-4 mg Oral Q6H   Followed by  . [START ON 08/09/2014] LORazepam  0-4 mg Oral Q12H  . multivitamin with minerals  1 tablet Oral Daily  . nicotine  21 mg Transdermal Daily  . thiamine  100 mg Oral Daily   Continuous Infusions: . sodium chloride 100 mL/hr at 08/07/14 1109    Principal Problem:    CAP (community acquired pneumonia) Active Problems:   Alcohol abuse   HTN (hypertension)   Current smoker   Postobstructive pneumonia   Lung mass   Abscess of right groin    Time spent: 37 minutes    HONGALGI,ANAND, MD, FACP, FHM. [facility] Pager [phone]  If 7PM-7AM, please contact night-coverage [email or web address] Password [email or web address] 08/07/2014, 3:21 PM    LOS: 1 day

## 2014-08-07 NOTE — Consult Note (Addendum)
Name: Brendan Hooper MRN: [id number] DOB: 22-Feb-1946    ADMISSION DATE:  08/06/2014 CONSULTATION DATE:  08/07/2014   REFERRING MD :  Algis Liming TRH   CHIEF COMPLAINT:  Chest mass  BRIEF PATIENT DESCRIPTION:  68 year old male with progressive right middle lobe lung mass and associated right inguinal lymph node mass. Pulmonary was consulted   SIGNIFICANT EVENTS  CT of the chest showed enlarging right sided lung mass 08/06/2014  STUDIES:     HISTORY OF PRESENT ILLNESS:   This is a 68 year old male history of right-sided lung mass and right groin mass that has had increased size. The patient is noted weight loss and a chronic cough. Symptoms progressed the patient presented to the emergency room for further evaluation. Note this patient had been previously evaluated in 2013 with a lung nodule and declined evaluation of same. Patient continues to smoke cigarettes. Pulmonary was consult to evaluate for possible bronchoscopy  PAST MEDICAL HISTORY :   has a past medical history of Hypertension; GERD (gastroesophageal reflux disease); Headache(784.0); and Sickle cell anemia.  has past surgical history that includes No past surgeries. Prior to Admission medications   Medication Sig Start Date End Date Taking? Authorizing Provider  naproxen sodium (ALEVE) 220 MG tablet Take 220 mg by mouth daily as needed (pain).   Yes Historical Provider, MD  Phenyleph-Doxylamine-DM-APAP (ALKA SELTZER PLUS PO) Take 1 tablet by mouth daily as needed (cold symptome).   Yes Historical Provider, MD  Pseudoeph-Doxylamine-DM-APAP (NYQUIL PO) Take 1 tablet by mouth at bedtime as needed (cold symptoms).   Yes Historical Provider, MD   No Known Allergies  FAMILY HISTORY:  family history is not on file. SOCIAL HISTORY:  reports that he has been smoking Cigarettes.  He has a 20 pack-year smoking history. He has never used smokeless tobacco. He reports that he drinks alcohol. He reports that he does not use illicit  drugs.  REVIEW OF SYSTEMS:   Pos in BOLD Constitutional: Negative for fever, chills, weight loss, malaise/fatigue and diaphoresis.  HENT: Negative for hearing loss, ear pain, nosebleeds, congestion, sore throat, neck pain, tinnitus and ear discharge.   Eyes: Negative for blurred vision, double vision, photophobia, pain, discharge and redness.  Respiratory:  cough, hemoptysis, sputum production, shortness of breath, wheezing and stridor.   Cardiovascular: Negative for chest pain, palpitations, orthopnea, claudication, leg swelling and PND.  Gastrointestinal: Negative for heartburn, nausea, vomiting, abdominal pain, diarrhea, constipation, blood in stool and melena.  Genitourinary: Negative for dysuria, urgency, frequency, hematuria and flank pain.  Musculoskeletal: Negative for myalgias, back pain, joint pain and falls.  Skin: Negative for itching and rash.  Neurological: Negative for dizziness, tingling, tremors, sensory change, speech change, focal weakness, seizures, loss of consciousness, weakness and headaches.  Endo/Heme/Allergies: Negative for environmental allergies and polydipsia. Does not bruise/bleed easily.  SUBJECTIVE:   VITAL SIGNS: Temp:  [98.4 F (36.9 C)-99.5 F (37.5 C)] 98.4 F (36.9 C) (12/20 1118) Pulse Rate:  [92-115] 96 (12/20 1118) Resp:  [18-20] 20 (12/20 1118) BP: (128-150)/(68-78) 140/73 mmHg (12/20 1118) SpO2:  [93 %-100 %] 99 % (12/20 1118) Weight:  [68.811 kg (151 lb 11.2 oz)-71.804 kg (158 lb 4.8 oz)] 69.128 kg (152 lb 6.4 oz) (12/20 0500)  PHYSICAL EXAMINATION: General:  Ill-appearing cachectic male in no distress Neuro:  Neurologically intact moves all fours HEENT:  No jugular venous distention no lymphadenopathy noted no thyromegaly Cardiovascular:  Regular rate and rhythm normal S1-S2 no S3 or S4 Lungs:  Distant breath sounds Abdomen:  Soft  nontender no organomegaly Musculoskeletal:  Full range of motion no joint deformity Skin:  Clear No  significant enlargement of right inguinal lymph node   Recent Labs Lab 08/06/14 2026 08/07/14 0420  NA 135* 136*  K 3.6* 3.6*  CL 93* 95*  CO2 26 25  BUN 6 5*  CREATININE 0.57 0.46*  GLUCOSE 127* 100*    Recent Labs Lab 08/06/14 2026 08/07/14 0420  HGB 16.1 14.7  HCT 44.7 41.2  WBC 11.8* 10.0  PLT 334 312   Dg Chest 2 View  08/06/2014   CLINICAL DATA:  Acute onset of productive cough for 2 weeks. Current history of smoking. Initial encounter.  EXAM: CHEST  2 VIEW  COMPARISON:  Chest radiograph and CT of the chest performed 10/11/2011  FINDINGS: There is masslike consolidation and expansion of the right middle lobe. Given the prior pulmonary nodule within the right upper lobe at the right minor fissure in 2013, this is suspicious for a large 9.7 cm mass and adjacent postobstructive pneumonia.  The left lung appears relatively clear, aside from mildly increased interstitial markings, likely chronic in nature. No pleural effusion or pneumothorax is seen.  The heart remains normal in size. No acute osseous abnormalities are identified.  IMPRESSION: Masslike consolidation and expansion at the right middle lobe. Given the prior pulmonary nodule within the right upper lobe at the right minor fissure in 2013, this is suspicious for a large 9.7 cm mass and adjacent postobstructive pneumonia. CT of the chest would be helpful for further evaluation, when and as deemed clinically appropriate.   Electronically Signed   By: Garald Balding M.D.   On: 08/06/2014 21:27   Ct Hip Right W Contrast  08/06/2014   CLINICAL DATA:  Significant right inguinal swelling over the past few days. Initial encounter.  EXAM: CT OF THE RIGHT HIP WITH CONTRAST  TECHNIQUE: Multidetector CT imaging was performed following the standard protocol during bolus administration of intravenous contrast.  CONTRAST:  139mL OMNIPAQUE IOHEXOL 300 MG/ML  SOLN  COMPARISON:  None.  FINDINGS: There is a large 4.6 x 3.8 x 4.0 cm  peripherally enhancing collection of fluid at the right inguinal region. This may reflect abscess arising from a centrally necrotic node. This may reflect underlying infection extending up the right lower extremity, or focal right inguinal infection. Overlying soft tissue inflammation is seen, with associated skin thickening extending along the proximal anterior right thigh.  There is mildly asymmetric enhancement of additional smaller right inguinal nodes. There is no evidence of vascular compromise. The underlying vasculature is unremarkable in appearance.  The abscess abuts the adjacent quadriceps musculature. There is soft tissue inflammation along the fascia of the anterior right thigh.  No acute osseous abnormalities are seen.  Visualized small large bowel loops are grossly unremarkable. The bladder is mildly distended and unremarkable in appearance. A small amount of free fluid within the pelvis is of uncertain significance. The prostate remains normal in size.  IMPRESSION: 1. Large 4.6 x 3.8 x 4.0 cm peripherally enhancing collection of fluid at the right inguinal region. This may reflect an abscess arising from a centrally necrotic node, due to underlying infection extending up the right lower extremity or focal right inguinal infection. Overlying soft tissue inflammation, with associated skin thickening extending along the proximal anterior right thigh. 2. Mildly asymmetric enhancement of the smaller right inguinal nodes. No evidence of vascular compromise. 3. Abscess abuts the adjacent quadriceps musculature, with soft tissue inflammation along the fascia of the anterior  right thigh. 4. Small amount of free fluid within the pelvis, of uncertain significance.   Electronically Signed   By: Garald Balding M.D.   On: 08/06/2014 23:00    ASSESSMENT / PLAN: Principal Problem:   CAP (community acquired pneumonia) Active Problems:   Alcohol abuse   HTN (hypertension)   Current smoker   Postobstructive  pneumonia   Lung mass   Abscess of right groin    #1 enlarging right sided lung mass 9.7 cm .  This likely represents malignancy until proven otherwise and there is also postobstructive pneumonia seen   Plan   While I bronchoscopy would seem indicated in this case I   would start with a core biopsy of the right inguinal lymph node   to see if tissue can be obtained from this direction    I took the liberty of ordering an interventional radiology core   biopsy of the lymph node however if general surgery would   like to perform this this would be quite suitable  Mariel Sleet Beeper  [phone]  Cell  [phone]  If no response or cell goes to voicemail, call beeper [phone]  Pulmonary and [place] Pager: [phone]  08/07/2014, 1:54 PM

## 2014-08-07 NOTE — Consult Note (Signed)
Reason for Consult: Right inguinal mass Referring Physician: Garnie Borchardt is an 68 y.o. male.  HPI: Patient presented to the emergency department complaining of 2 weeks of cough. He was found on workup to have a mass in his right groin. It also appears he has a lung mass and postobstructive pneumonia. CT scan was done of his right hip showing the mass being an abscess versus necrotic lymph node. I was asked to see him in consultation regarding further evaluation of this mass. Patient only describes noticing it for 1 week and denies significant pain in the area.  Past Medical History  Diagnosis Date  . Hypertension   . GERD (gastroesophageal reflux disease)   . Headache(784.0)   . Sickle cell anemia     Past Surgical History  Procedure Laterality Date  . No past surgeries      No family history on file.  Social History:  reports that he has been smoking Cigarettes.  He has a 40 pack-year smoking history. He has never used smokeless tobacco. He reports that he drinks about 1.2 oz of alcohol per week. He reports that he does not use illicit drugs.  Allergies: No Known Allergies  Medications: Prior to Admission:  (Not in a hospital admission)  Results for orders placed or performed during the hospital encounter of 08/06/14 (from the past 48 hour(s))  CBC with Differential     Status: Abnormal   Collection Time: 08/06/14  8:26 PM  Result Value Ref Range   WBC 11.8 (H) 4.0 - 10.5 K/uL   RBC 5.85 (H) 4.22 - 5.81 MIL/uL   Hemoglobin 16.1 13.0 - 17.0 g/dL   HCT 44.7 39.0 - 52.0 %   MCV 76.4 (L) 78.0 - 100.0 fL   MCH 27.5 26.0 - 34.0 pg   MCHC 36.0 30.0 - 36.0 g/dL   RDW 16.3 (H) 11.5 - 15.5 %   Platelets 334 150 - 400 K/uL   Neutrophils Relative % 73 43 - 77 %   Neutro Abs 8.5 (H) 1.7 - 7.7 K/uL   Lymphocytes Relative 17 12 - 46 %   Lymphs Abs 2.0 0.7 - 4.0 K/uL   Monocytes Relative 10 3 - 12 %   Monocytes Absolute 1.2 (H) 0.1 - 1.0 K/uL   Eosinophils Relative 0  0 - 5 %   Eosinophils Absolute 0.1 0.0 - 0.7 K/uL   Basophils Relative 0 0 - 1 %   Basophils Absolute 0.0 0.0 - 0.1 K/uL  Basic metabolic panel     Status: Abnormal   Collection Time: 08/06/14  8:26 PM  Result Value Ref Range   Sodium 135 (L) 137 - 147 mEq/L   Potassium 3.6 (L) 3.7 - 5.3 mEq/L   Chloride 93 (L) 96 - 112 mEq/L   CO2 26 19 - 32 mEq/L   Glucose, Bld 127 (H) 70 - 99 mg/dL   BUN 6 6 - 23 mg/dL   Creatinine, Ser 0.57 0.50 - 1.35 mg/dL   Calcium 9.4 8.4 - 10.5 mg/dL   GFR calc non Af Amer >90 >90 mL/min   GFR calc Af Amer >90 >90 mL/min    Comment: (NOTE) The eGFR has been calculated using the CKD EPI equation. This calculation has not been validated in all clinical situations. eGFR's persistently <90 mL/min signify possible Chronic Kidney Disease.    Anion gap 16 (H) 5 - 15    Dg Chest 2 View  08/06/2014   CLINICAL DATA:  Acute  onset of productive cough for 2 weeks. Current history of smoking. Initial encounter.  EXAM: CHEST  2 VIEW  COMPARISON:  Chest radiograph and CT of the chest performed 10/11/2011  FINDINGS: There is masslike consolidation and expansion of the right middle lobe. Given the prior pulmonary nodule within the right upper lobe at the right minor fissure in 2013, this is suspicious for a large 9.7 cm mass and adjacent postobstructive pneumonia.  The left lung appears relatively clear, aside from mildly increased interstitial markings, likely chronic in nature. No pleural effusion or pneumothorax is seen.  The heart remains normal in size. No acute osseous abnormalities are identified.  IMPRESSION: Masslike consolidation and expansion at the right middle lobe. Given the prior pulmonary nodule within the right upper lobe at the right minor fissure in 2013, this is suspicious for a large 9.7 cm mass and adjacent postobstructive pneumonia. CT of the chest would be helpful for further evaluation, when and as deemed clinically appropriate.   Electronically Signed    By: Garald Balding M.D.   On: 08/06/2014 21:27   Ct Hip Right W Contrast  08/06/2014   CLINICAL DATA:  Significant right inguinal swelling over the past few days. Initial encounter.  EXAM: CT OF THE RIGHT HIP WITH CONTRAST  TECHNIQUE: Multidetector CT imaging was performed following the standard protocol during bolus administration of intravenous contrast.  CONTRAST:  173m OMNIPAQUE IOHEXOL 300 MG/ML  SOLN  COMPARISON:  None.  FINDINGS: There is a large 4.6 x 3.8 x 4.0 cm peripherally enhancing collection of fluid at the right inguinal region. This may reflect abscess arising from a centrally necrotic node. This may reflect underlying infection extending up the right lower extremity, or focal right inguinal infection. Overlying soft tissue inflammation is seen, with associated skin thickening extending along the proximal anterior right thigh.  There is mildly asymmetric enhancement of additional smaller right inguinal nodes. There is no evidence of vascular compromise. The underlying vasculature is unremarkable in appearance.  The abscess abuts the adjacent quadriceps musculature. There is soft tissue inflammation along the fascia of the anterior right thigh.  No acute osseous abnormalities are seen.  Visualized small large bowel loops are grossly unremarkable. The bladder is mildly distended and unremarkable in appearance. A small amount of free fluid within the pelvis is of uncertain significance. The prostate remains normal in size.  IMPRESSION: 1. Large 4.6 x 3.8 x 4.0 cm peripherally enhancing collection of fluid at the right inguinal region. This may reflect an abscess arising from a centrally necrotic node, due to underlying infection extending up the right lower extremity or focal right inguinal infection. Overlying soft tissue inflammation, with associated skin thickening extending along the proximal anterior right thigh. 2. Mildly asymmetric enhancement of the smaller right inguinal nodes. No evidence  of vascular compromise. 3. Abscess abuts the adjacent quadriceps musculature, with soft tissue inflammation along the fascia of the anterior right thigh. 4. Small amount of free fluid within the pelvis, of uncertain significance.   Electronically Signed   By: JGarald BaldingM.D.   On: 08/06/2014 23:00    Review of Systems  Constitutional: Positive for weight loss.  HENT: Negative.   Eyes: Negative.   Respiratory: Positive for cough.   Cardiovascular: Negative.   Gastrointestinal: Negative.   Genitourinary: Negative.   Musculoskeletal:       Mass right groin for 1 week, minimal discomfort  Skin: Negative.   Neurological: Negative.   Endo/Heme/Allergies: Negative.   Psychiatric/Behavioral: Negative.  Blood pressure 141/78, pulse 96, temperature 99.5 F (37.5 C), temperature source Oral, resp. rate 18, height 6' (1.829 m), weight 158 lb 4.8 oz (71.804 kg), SpO2 94 %. Physical Exam  Constitutional: He is oriented to person, place, and time. He appears well-developed.  Cachectic in appearance  HENT:  Head: Normocephalic.  Mouth/Throat: Oropharynx is clear and moist.  Neck: Neck supple.  Cardiovascular: Normal rate and normal heart sounds.   Respiratory:  Mild wheezing and productive cough  GI: Soft. He exhibits no distension. There is no tenderness.  Musculoskeletal:  Right inguinal region is 4 cm mass with mild erythema and no tenderness. Area was prepped in a sterile fashion. 18-gauge needle was passed into it twice. I was unable to withdraw any fluid. I applied a dressing.  Neurological: He is alert and oriented to person, place, and time.  Skin: Skin is warm.    Assessment/Plan: Right inguinal mass likely represents necrotic lymph node. I was unable to aspirate any fluid as described above. Agree with medical admission and further workup for pulmonary malignancy and PNA. We will follow-up as formal biopsy of this mass may be required.  Coco Sharpnack E 08/07/2014, 12:19 AM

## 2014-08-07 NOTE — Progress Notes (Signed)
Pt arrive to unit in no s/s of distress. VS stable. Pt A&Ox4. Pt oriented to room and unit. Report received from ED nurse prior to pt's arrival. Whiteboard updated. Callbell within reach. Will continue to monitor.

## 2014-08-08 ENCOUNTER — Inpatient Hospital Stay (HOSPITAL_COMMUNITY): Payer: Medicare Other

## 2014-08-08 ENCOUNTER — Encounter (HOSPITAL_COMMUNITY): Payer: Self-pay | Admitting: Radiology

## 2014-08-08 DIAGNOSIS — R599 Enlarged lymph nodes, unspecified: Secondary | ICD-10-CM | POA: Insufficient documentation

## 2014-08-08 DIAGNOSIS — J189 Pneumonia, unspecified organism: Secondary | ICD-10-CM | POA: Diagnosis not present

## 2014-08-08 DIAGNOSIS — E43 Unspecified severe protein-calorie malnutrition: Secondary | ICD-10-CM | POA: Insufficient documentation

## 2014-08-08 LAB — STREP PNEUMONIAE URINARY ANTIGEN: STREP PNEUMO URINARY ANTIGEN: NEGATIVE

## 2014-08-08 MED ORDER — ACETAMINOPHEN 325 MG PO TABS
650.0000 mg | ORAL_TABLET | Freq: Four times a day (QID) | ORAL | Status: DC | PRN
  Administered 2014-08-08 – 2014-08-11 (×8): 650 mg via ORAL
  Filled 2014-08-08 (×8): qty 2

## 2014-08-08 MED ORDER — MIDAZOLAM HCL 2 MG/2ML IJ SOLN
INTRAMUSCULAR | Status: AC
Start: 2014-08-08 — End: 2014-08-08
  Filled 2014-08-08: qty 4

## 2014-08-08 MED ORDER — LIDOCAINE HCL (PF) 1 % IJ SOLN
INTRAMUSCULAR | Status: AC
Start: 2014-08-08 — End: 2014-08-08
  Filled 2014-08-08: qty 10

## 2014-08-08 MED ORDER — MIDAZOLAM HCL 2 MG/2ML IJ SOLN
INTRAMUSCULAR | Status: AC | PRN
  Administered 2014-08-08: 1 mg via INTRAVENOUS

## 2014-08-08 MED ORDER — POTASSIUM CHLORIDE CRYS ER 20 MEQ PO TBCR
40.0000 meq | EXTENDED_RELEASE_TABLET | Freq: Once | ORAL | Status: AC
  Administered 2014-08-08: 40 meq via ORAL
  Filled 2014-08-08: qty 2

## 2014-08-08 MED ORDER — FENTANYL CITRATE 0.05 MG/ML IJ SOLN
INTRAMUSCULAR | Status: AC | PRN
  Administered 2014-08-08: 50 ug via INTRAVENOUS

## 2014-08-08 MED ORDER — HEPARIN SODIUM (PORCINE) 5000 UNIT/ML IJ SOLN
5000.0000 [IU] | Freq: Three times a day (TID) | INTRAMUSCULAR | Status: DC
  Administered 2014-08-08 – 2014-08-10 (×7): 5000 [IU] via SUBCUTANEOUS
  Filled 2014-08-08 (×10): qty 1

## 2014-08-08 MED ORDER — FENTANYL CITRATE 0.05 MG/ML IJ SOLN
INTRAMUSCULAR | Status: AC
  Filled 2014-08-08: qty 4

## 2014-08-08 NOTE — Progress Notes (Signed)
INITIAL NUTRITION ASSESSMENT  Pt meets criteria for SEVERE MALNUTRITION in the context of chronic illness as evidenced by severe fat and muscle mass loss.  DOCUMENTATION CODES Per approved criteria  -Severe malnutrition in the context of chronic illness   INTERVENTION: Once diet advances, will provide/order oral supplement if po intake becomes poor.  NUTRITION DIAGNOSIS: Malnutrition related to chronic illness as evidenced by severe fat and muscle mass depletion.   Goal: Pt to meet >/= 90% of their estimated nutrition needs   Monitor:  Diet advancement, weight trends, labs, I/O's  Reason for Assessment: MST  68 y.o. male  Admitting Dx: CAP (community acquired pneumonia)  ASSESSMENT: Pt with Past medical history of hypertension, GERD, pulmonary nodule. Patient presents with complaints of cough that has been ongoing for last few days associated with fever and chills. Chest x-ray shows large lung mass with possible postobstructive pneumonia.  Pt is currently NPO for need of bronch bx. Pt reports he has been having a great appetite at home eating at least 2 full meals a day with snacks in between. Pt does report weight loss with his usual body weight of 175 lbs which he reports weighing 2 months ago. He reports his weight loss has been from getting sick (common cold) 2 months ago. Pt with a 12% weight loss. Pt is agreeable on oral supplements once diet advances. RD to order once pt may eat again.   Nutrition Focused Physical Exam:  Subcutaneous Fat:  Orbital Region: N/A Upper Arm Region: Moderate depletion Thoracic and Lumbar Region: Severe depletion  Muscle:  Temple Region: N/A Clavicle Bone Region: Severe depletion Clavicle and Acromion Bone Region: Severe depletion Scapular Bone Region: N/A Dorsal Hand: N/A Patellar Region: WNL Anterior Thigh Region: WNL Posterior Calf Region: Moderate depletion  Edema: none  Labs: Low sodium, chloride, potassium, BUN,  creatinine.  Height: Ht Readings from Last 1 Encounters:  08/07/14 6' (1.829 m)    Weight: Wt Readings from Last 1 Encounters:  08/08/14 154 lb 8.7 oz (70.1 kg)    Ideal Body Weight: 178 lbs  % Ideal Body Weight: 87%  Wt Readings from Last 10 Encounters:  08/08/14 154 lb 8.7 oz (70.1 kg)  10/11/11 156 lb (70.761 kg)    Usual Body Weight: 175 lbs (pt reports 2 months ago)  % Usual Body Weight: 88%  BMI:  Body mass index is 20.96 kg/(m^2).  Estimated Nutritional Needs: Kcal: 2000-2200 Protein: 85-100 grams  Fluid: 2 - 2.2 L/day  Skin: incision right groin  Diet Order: Diet NPO time specified Except for: Sips with Meds  EDUCATION NEEDS: -No education needs identified at this time   Intake/Output Summary (Last 24 hours) at 08/08/14 0958 Last data filed at 08/08/14 0644  Gross per 24 hour  Intake    300 ml  Output    300 ml  Net      0 ml    Last BM: 12/20  Labs:   Recent Labs Lab 08/06/14 2026 08/07/14 0420  NA 135* 136*  K 3.6* 3.6*  CL 93* 95*  CO2 26 25  BUN 6 5*  CREATININE 0.57 0.46*  CALCIUM 9.4 9.0  GLUCOSE 127* 100*    CBG (last 3)  No results for input(s): GLUCAP in the last 72 hours.  Scheduled Meds: . azithromycin  500 mg Intravenous Q24H  . cefTRIAXone (ROCEPHIN)  IV  1 g Intravenous Q24H  . folic acid  1 mg Oral Daily  . heparin  5,000 Units Subcutaneous 3  times per day  . Influenza vac split quadrivalent PF  0.5 mL Intramuscular Tomorrow-1000  . LORazepam  0-4 mg Oral Q6H   Followed by  . [START ON 08/09/2014] LORazepam  0-4 mg Oral Q12H  . multivitamin with minerals  1 tablet Oral Daily  . nicotine  21 mg Transdermal Daily  . thiamine  100 mg Oral Daily    Continuous Infusions:   Past Medical History  Diagnosis Date  . Hypertension   . GERD (gastroesophageal reflux disease)   . Headache(784.0)   . Sickle cell anemia     Past Surgical History  Procedure Laterality Date  . No past surgeries      Kallie Locks,  MS, RD, LDN Pager # [phone] After hours/ weekend pager # [phone]

## 2014-08-08 NOTE — Procedures (Signed)
Successful RT INGUINAL NODE CORE BX NO COMP STABLE FULL REPORT IN PACS PATH PENDING

## 2014-08-08 NOTE — Consult Note (Signed)
ORTHOPAEDIC CONSULTATION  REQUESTING PHYSICIAN: Modena Jansky, MD  Chief Complaint: right quad fluid collection  HPI: Brendan Hooper is a 68 y.o. male who is undergoing an extensive neoplastic work up and had an incidental finding of possible right quad abscess that is very small. He denies any thigh pain  Past Medical History  Diagnosis Date  . Hypertension   . GERD (gastroesophageal reflux disease)   . Headache(784.0)   . Sickle cell anemia    Past Surgical History  Procedure Laterality Date  . No past surgeries     History   Social History  . Marital Status: Single    Spouse Name: N/A    Number of Children: N/A  . Years of Education: N/A   Social History Main Topics  . Smoking status: Current Every Day Smoker -- 0.50 packs/day for 40 years    Types: Cigarettes  . Smokeless tobacco: Never Used  . Alcohol Use: Yes     Comment: a pint of liquor a day  . Drug Use: No  . Sexual Activity: Yes   Other Topics Concern  . None   Social History Narrative   History reviewed. No pertinent family history. No Known Allergies Prior to Admission medications   Medication Sig Start Date End Date Taking? Authorizing Provider  naproxen sodium (ALEVE) 220 MG tablet Take 220 mg by mouth daily as needed (pain).   Yes Historical Provider, MD  Phenyleph-Doxylamine-DM-APAP (ALKA SELTZER PLUS PO) Take 1 tablet by mouth daily as needed (cold symptome).   Yes Historical Provider, MD  Pseudoeph-Doxylamine-DM-APAP (NYQUIL PO) Take 1 tablet by mouth at bedtime as needed (cold symptoms).   Yes Historical Provider, MD   Dg Chest 2 View  08/06/2014   CLINICAL DATA:  Acute onset of productive cough for 2 weeks. Current history of smoking. Initial encounter.  EXAM: CHEST  2 VIEW  COMPARISON:  Chest radiograph and CT of the chest performed 10/11/2011  FINDINGS: There is masslike consolidation and expansion of the right middle lobe. Given the prior pulmonary nodule within the right upper lobe  at the right minor fissure in 2013, this is suspicious for a large 9.7 cm mass and adjacent postobstructive pneumonia.  The left lung appears relatively clear, aside from mildly increased interstitial markings, likely chronic in nature. No pleural effusion or pneumothorax is seen.  The heart remains normal in size. No acute osseous abnormalities are identified.  IMPRESSION: Masslike consolidation and expansion at the right middle lobe. Given the prior pulmonary nodule within the right upper lobe at the right minor fissure in 2013, this is suspicious for a large 9.7 cm mass and adjacent postobstructive pneumonia. CT of the chest would be helpful for further evaluation, when and as deemed clinically appropriate.   Electronically Signed   By: Garald Balding M.D.   On: 08/06/2014 21:27   Ct Hip Right W Contrast  08/06/2014   CLINICAL DATA:  Significant right inguinal swelling over the past few days. Initial encounter.  EXAM: CT OF THE RIGHT HIP WITH CONTRAST  TECHNIQUE: Multidetector CT imaging was performed following the standard protocol during bolus administration of intravenous contrast.  CONTRAST:  116m OMNIPAQUE IOHEXOL 300 MG/ML  SOLN  COMPARISON:  None.  FINDINGS: There is a large 4.6 x 3.8 x 4.0 cm peripherally enhancing collection of fluid at the right inguinal region. This may reflect abscess arising from a centrally necrotic node. This may reflect underlying infection extending up the right lower extremity, or focal  right inguinal infection. Overlying soft tissue inflammation is seen, with associated skin thickening extending along the proximal anterior right thigh.  There is mildly asymmetric enhancement of additional smaller right inguinal nodes. There is no evidence of vascular compromise. The underlying vasculature is unremarkable in appearance.  The abscess abuts the adjacent quadriceps musculature. There is soft tissue inflammation along the fascia of the anterior right thigh.  No acute osseous  abnormalities are seen.  Visualized small large bowel loops are grossly unremarkable. The bladder is mildly distended and unremarkable in appearance. A small amount of free fluid within the pelvis is of uncertain significance. The prostate remains normal in size.  IMPRESSION: 1. Large 4.6 x 3.8 x 4.0 cm peripherally enhancing collection of fluid at the right inguinal region. This may reflect an abscess arising from a centrally necrotic node, due to underlying infection extending up the right lower extremity or focal right inguinal infection. Overlying soft tissue inflammation, with associated skin thickening extending along the proximal anterior right thigh. 2. Mildly asymmetric enhancement of the smaller right inguinal nodes. No evidence of vascular compromise. 3. Abscess abuts the adjacent quadriceps musculature, with soft tissue inflammation along the fascia of the anterior right thigh. 4. Small amount of free fluid within the pelvis, of uncertain significance.   Electronically Signed   By: Garald Balding M.D.   On: 08/06/2014 23:00    Positive ROS: All other systems have been reviewed and were otherwise negative with the exception of those mentioned in the HPI and as above.  Labs cbc  Recent Labs  08/06/14 2026 08/07/14 0420  WBC 11.8* 10.0  HGB 16.1 14.7  HCT 44.7 41.2  PLT 334 312    Labs inflam No results for input(s): CRP in the last 72 hours.  Invalid input(s): ESR  Labs coag  Recent Labs  08/07/14 0420  INR 1.25     Recent Labs  08/06/14 2026 08/07/14 0420  NA 135* 136*  K 3.6* 3.6*  CL 93* 95*  CO2 26 25  GLUCOSE 127* 100*  BUN 6 5*  CREATININE 0.57 0.46*  CALCIUM 9.4 9.0    Physical Exam: Filed Vitals:   08/08/14 1657  BP: 143/85  Pulse: 93  Temp: 99.3 F (37.4 C)  Resp: 20   General: Alert, no acute distress Cardiovascular: No pedal edema Respiratory: No cyanosis, no use of accessory musculature GI: No organomegaly, abdomen is soft and  non-tender Skin: No lesions in the area of chief complaint other than those listed below in MSK exam.  Neurologic: Sensation intact distally Psychiatric: Patient is competent for consent with normal mood and affect   MUSCULOSKELETAL:  RLE: no TTP, Distally sensation intact globally, +TA/GS/EHL, 2+DP. Compartments soft Other extremities are atraumatic with painless ROM and NVI.  Assessment: Possible R thigh small abscess  Plan: I recommend VIR aspiration of this rectus muscle fluid collection. This combined with antibiotics based on culture results should be curative for this small soft tissue abscess. I do not see an indication for surgical intervention at this time. I will sign off at this time please call with any further questions.   Edmonia Lynch, D, MD Cell [phone]   08/08/2014 4:59 PM

## 2014-08-08 NOTE — Sedation Documentation (Signed)
Pt denied any pain

## 2014-08-08 NOTE — Care Management Note (Unsigned)
    Page 1 of 1   08/11/2014     1:41:34 PM CARE MANAGEMENT NOTE 08/11/2014  Patient:  Brendan Hooper, Brendan Hooper   Account Number:  [id number]  Date Initiated:  08/08/2014  Documentation initiated by:  Tomi Bamberger  Subjective/Objective Assessment:   dx hcap,lung mass  admit- lives with friend.     Action/Plan:   awaiting pathology   Anticipated DC Date:  08/09/2014   Anticipated DC Plan:  [place]  CM consult      Choice offered to / List presented to:             Status of service:  In process, will continue to follow Medicare Important Message given?  YES (If response is "NO", the following Medicare IM given date fields will be blank) Date Medicare IM given:  08/08/2014 Medicare IM given by:  Tomi Bamberger Date Additional Medicare IM given:  08/11/2014 Additional Medicare IM given by:  [facility]  Discharge Disposition:    Per UR Regulation:  Reviewed for med. necessity/level of care/duration of stay  If discussed at [place] of Stay Meetings, dates discussed:    Comments:

## 2014-08-08 NOTE — Consult Note (Signed)
Name: Brendan Hooper MRN: [id number] DOB: Jan 09, 1946    ADMISSION DATE:  08/06/2014 CONSULTATION DATE:  08/08/2014   REFERRING MD :  Algis Liming TR[facility]   CHIEF COMPLAINT:  Chest mass  BRIEF PATIENT DESCRIPTION:  68 year old smoker with progressive right middle lobe lung mass and associated right inguinal lymph node mass. The patient is noted weight loss and a chronic cough. Symptoms progressed the patient presented to the emergency room for further evaluation. Note this patient had been previously evaluated in 2013 with a lung nodule and declined evaluation of same.   SIGNIFICANT EVENTS  CT of the chest showed enlarging right sided lung mass 08/06/2014  STUDIES:   SUBJECTIVE:   VITAL SIGNS: Temp:  [97.9 F (36.6 C)-100.7 F (38.2 C)] 97.9 F (36.6 C) (12/21 0549) Pulse Rate:  [73-103] 73 (12/21 0549) Resp:  [18-20] 18 (12/21 0549) BP: (122-144)/(67-80) 122/67 mmHg (12/21 0549) SpO2:  [93 %-99 %] 99 % (12/21 0549) Weight:  [70.1 kg (154 lb 8.7 oz)] 70.1 kg (154 lb 8.7 oz) (12/21 0549)  PHYSICAL EXAMINATION: General:  Ill-appearing cachectic male in no distress Neuro:  Neurologically intact moves all fours HEENT:  No jugular venous distention no lymphadenopathy noted no thyromegaly Cardiovascular:  Regular rate and rhythm normal S1-S2 no S3 or S4 Lungs:  Distant breath sounds, but clear w/ no accessory muscle use  Abdomen:  Soft nontender no organomegaly Musculoskeletal:  Full range of motion no joint deformity Skin:  Clear No significant enlargement of right inguinal lymph node   Recent Labs Lab 08/06/14 2026 08/07/14 0420  NA 135* 136*  K 3.6* 3.6*  CL 93* 95*  CO2 26 25  BUN 6 5*  CREATININE 0.57 0.46*  GLUCOSE 127* 100*    Recent Labs Lab 08/06/14 2026 08/07/14 0420  HGB 16.1 14.7  HCT 44.7 41.2  WBC 11.8* 10.0  PLT 334 312   Dg Chest 2 View  08/06/2014   CLINICAL DATA:  Acute onset of productive cough for 2 weeks. Current history of smoking. Initial  encounter.  EXAM: CHEST  2 VIEW  COMPARISON:  Chest radiograph and CT of the chest performed 10/11/2011  FINDINGS: There is masslike consolidation and expansion of the right middle lobe. Given the prior pulmonary nodule within the right upper lobe at the right minor fissure in 2013, this is suspicious for a large 9.7 cm mass and adjacent postobstructive pneumonia.  The left lung appears relatively clear, aside from mildly increased interstitial markings, likely chronic in nature. No pleural effusion or pneumothorax is seen.  The heart remains normal in size. No acute osseous abnormalities are identified.  IMPRESSION: Masslike consolidation and expansion at the right middle lobe. Given the prior pulmonary nodule within the right upper lobe at the right minor fissure in 2013, this is suspicious for a large 9.7 cm mass and adjacent postobstructive pneumonia. CT of the chest would be helpful for further evaluation, when and as deemed clinically appropriate.   Electronically Signed   By: Garald Balding M.D.   On: 08/06/2014 21:27   Ct Hip Right W Contrast  08/06/2014   CLINICAL DATA:  Significant right inguinal swelling over the past few days. Initial encounter.  EXAM: CT OF THE RIGHT HIP WITH CONTRAST  TECHNIQUE: Multidetector CT imaging was performed following the standard protocol during bolus administration of intravenous contrast.  CONTRAST:  118mL OMNIPAQUE IOHEXOL 300 MG/ML  SOLN  COMPARISON:  None.  FINDINGS: There is a large 4.6 x 3.8 x 4.0 cm peripherally enhancing  collection of fluid at the right inguinal region. This may reflect abscess arising from a centrally necrotic node. This may reflect underlying infection extending up the right lower extremity, or focal right inguinal infection. Overlying soft tissue inflammation is seen, with associated skin thickening extending along the proximal anterior right thigh.  There is mildly asymmetric enhancement of additional smaller right inguinal nodes. There is no  evidence of vascular compromise. The underlying vasculature is unremarkable in appearance.  The abscess abuts the adjacent quadriceps musculature. There is soft tissue inflammation along the fascia of the anterior right thigh.  No acute osseous abnormalities are seen.  Visualized small large bowel loops are grossly unremarkable. The bladder is mildly distended and unremarkable in appearance. A small amount of free fluid within the pelvis is of uncertain significance. The prostate remains normal in size.  IMPRESSION: 1. Large 4.6 x 3.8 x 4.0 cm peripherally enhancing collection of fluid at the right inguinal region. This may reflect an abscess arising from a centrally necrotic node, due to underlying infection extending up the right lower extremity or focal right inguinal infection. Overlying soft tissue inflammation, with associated skin thickening extending along the proximal anterior right thigh. 2. Mildly asymmetric enhancement of the smaller right inguinal nodes. No evidence of vascular compromise. 3. Abscess abuts the adjacent quadriceps musculature, with soft tissue inflammation along the fascia of the anterior right thigh. 4. Small amount of free fluid within the pelvis, of uncertain significance.   Electronically Signed   By: Garald Balding M.D.   On: 08/06/2014 23:00    ASSESSMENT / PLAN: Principal Problem:   CAP (community acquired pneumonia) Active Problems:   Alcohol abuse   HTN (hypertension)   Current smoker   Postobstructive pneumonia   Lung mass   Abscess of right groin    enlarging right sided lung mass 9.7 cm .  This likely represents malignancy until proven otherwise and there is also postobstructive pneumonia seen Inguinal LN mass with large thigh abscess  Plan/rec start with a core biopsy of the right inguinal lymph node (ordered via IR) If non-diagnostic call us back-->we can then set up for Bronch/bx Defer to surgery/ortho if drainage needed for rt thigh abscess We will  be available as needed   BABCOCK,PETE  08/08/2014, 8:23 AM   Rigoberto Noel. MD

## 2014-08-08 NOTE — Consult Note (Signed)
Chief Complaint: Chief Complaint  Patient presents with  . URI  Enlarging Rt lung mass New Rt inguinal node enlargement  Referring Physician(s): TRH  History of Present Illness: Brendan Hooper is a 68 y.o. male   Pt has had known Rt lung mass since 2013 Chose no work up or treatment then Now returns to hospital with cough; fever; chills Work up does reveal PNA And enlarging Rt lung mass; also large Rt inguinal node TR[facility] requesting biopsy of same after consult with surgery Dr Annamaria Boots has reviewed imaging  I have seen and examined pt Now scheduled for procedure today  Past Medical History  Diagnosis Date  . Hypertension   . GERD (gastroesophageal reflux disease)   . Headache(784.0)   . Sickle cell anemia     Past Surgical History  Procedure Laterality Date  . No past surgeries      Allergies: Review of patient's allergies indicates no known allergies.  Medications: Prior to Admission medications   Medication Sig Start Date End Date Taking? Authorizing Provider  naproxen sodium (ALEVE) 220 MG tablet Take 220 mg by mouth daily as needed (pain).   Yes Historical Provider, MD  Phenyleph-Doxylamine-DM-APAP (ALKA SELTZER PLUS PO) Take 1 tablet by mouth daily as needed (cold symptome).   Yes Historical Provider, MD  Pseudoeph-Doxylamine-DM-APAP (NYQUIL PO) Take 1 tablet by mouth at bedtime as needed (cold symptoms).   Yes Historical Provider, MD    History reviewed. No pertinent family history.  History   Social History  . Marital Status: Single    Spouse Name: N/A    Number of Children: N/A  . Years of Education: N/A   Social History Main Topics  . Smoking status: Current Every Day Smoker -- 0.50 packs/day for 40 years    Types: Cigarettes  . Smokeless tobacco: Never Used  . Alcohol Use: Yes     Comment: a pint of liquor a day  . Drug Use: No  . Sexual Activity: Yes   Other Topics Concern  . None   Social History Narrative    Review of Systems: A 12  point ROS discussed and pertinent positives are indicated in the HPI above.  All other systems are negative.  Review of Systems  Constitutional: Positive for fever, activity change, appetite change and fatigue.  Respiratory: Positive for cough and shortness of breath. Negative for chest tightness.   Cardiovascular: Negative for chest pain.  Gastrointestinal: Negative for nausea and vomiting.  Musculoskeletal: Negative for back pain.  Psychiatric/Behavioral: Negative for behavioral problems and confusion.    Vital Signs: BP 122/67 mmHg  Pulse 73  Temp(Src) 97.9 F (36.6 C) (Oral)  Resp 18  Ht 6' (1.829 m)  Wt 70.1 kg (154 lb 8.7 oz)  BMI 20.96 kg/m2  SpO2 99%  Physical Exam  Constitutional: He is oriented to person, place, and time.  Cardiovascular: Normal rate and regular rhythm.   No murmur heard. Pulmonary/Chest: Effort normal and breath sounds normal. He has no wheezes.  Abdominal: Soft. Bowel sounds are normal. There is no tenderness.  Musculoskeletal: Normal range of motion.  Neurological: He is alert and oriented to person, place, and time.  Skin: Skin is warm and dry.  Psychiatric: He has a normal mood and affect. His behavior is normal. Judgment and thought content normal.  Nursing note and vitals reviewed.   Imaging: Dg Chest 2 View  08/06/2014   CLINICAL DATA:  Acute onset of productive cough for 2 weeks. Current history of smoking. Initial  encounter.  EXAM: CHEST  2 VIEW  COMPARISON:  Chest radiograph and CT of the chest performed 10/11/2011  FINDINGS: There is masslike consolidation and expansion of the right middle lobe. Given the prior pulmonary nodule within the right upper lobe at the right minor fissure in 2013, this is suspicious for a large 9.7 cm mass and adjacent postobstructive pneumonia.  The left lung appears relatively clear, aside from mildly increased interstitial markings, likely chronic in nature. No pleural effusion or pneumothorax is seen.  The  heart remains normal in size. No acute osseous abnormalities are identified.  IMPRESSION: Masslike consolidation and expansion at the right middle lobe. Given the prior pulmonary nodule within the right upper lobe at the right minor fissure in 2013, this is suspicious for a large 9.7 cm mass and adjacent postobstructive pneumonia. CT of the chest would be helpful for further evaluation, when and as deemed clinically appropriate.   Electronically Signed   By: Garald Balding M.D.   On: 08/06/2014 21:27   Ct Hip Right W Contrast  08/06/2014   CLINICAL DATA:  Significant right inguinal swelling over the past few days. Initial encounter.  EXAM: CT OF THE RIGHT HIP WITH CONTRAST  TECHNIQUE: Multidetector CT imaging was performed following the standard protocol during bolus administration of intravenous contrast.  CONTRAST:  168mL OMNIPAQUE IOHEXOL 300 MG/ML  SOLN  COMPARISON:  None.  FINDINGS: There is a large 4.6 x 3.8 x 4.0 cm peripherally enhancing collection of fluid at the right inguinal region. This may reflect abscess arising from a centrally necrotic node. This may reflect underlying infection extending up the right lower extremity, or focal right inguinal infection. Overlying soft tissue inflammation is seen, with associated skin thickening extending along the proximal anterior right thigh.  There is mildly asymmetric enhancement of additional smaller right inguinal nodes. There is no evidence of vascular compromise. The underlying vasculature is unremarkable in appearance.  The abscess abuts the adjacent quadriceps musculature. There is soft tissue inflammation along the fascia of the anterior right thigh.  No acute osseous abnormalities are seen.  Visualized small large bowel loops are grossly unremarkable. The bladder is mildly distended and unremarkable in appearance. A small amount of free fluid within the pelvis is of uncertain significance. The prostate remains normal in size.  IMPRESSION: 1. Large 4.6  x 3.8 x 4.0 cm peripherally enhancing collection of fluid at the right inguinal region. This may reflect an abscess arising from a centrally necrotic node, due to underlying infection extending up the right lower extremity or focal right inguinal infection. Overlying soft tissue inflammation, with associated skin thickening extending along the proximal anterior right thigh. 2. Mildly asymmetric enhancement of the smaller right inguinal nodes. No evidence of vascular compromise. 3. Abscess abuts the adjacent quadriceps musculature, with soft tissue inflammation along the fascia of the anterior right thigh. 4. Small amount of free fluid within the pelvis, of uncertain significance.   Electronically Signed   By: Garald Balding M.D.   On: 08/06/2014 23:00    Labs:  CBC:  Recent Labs  08/06/14 2026 08/07/14 0420  WBC 11.8* 10.0  HGB 16.1 14.7  HCT 44.7 41.2  PLT 334 312    COAGS:  Recent Labs  08/07/14 0420  INR 1.25    BMP:  Recent Labs  08/06/14 2026 08/07/14 0420  NA 135* 136*  K 3.6* 3.6*  CL 93* 95*  CO2 26 25  GLUCOSE 127* 100*  BUN 6 5*  CALCIUM 9.4  9.0  CREATININE 0.57 0.46*  GFRNONAA >90 >90  GFRAA >90 >90    LIVER FUNCTION TESTS:  Recent Labs  08/07/14 0420  BILITOT 1.0  AST 23  ALT 9  ALKPHOS 80  PROT 7.0  ALBUMIN 2.5*    TUMOR MARKERS: No results for input(s): AFPTM, CEA, CA199, CHROMGRNA in the last 8760 hours.  Assessment and Plan:  Known R lung mass since 2013---no treatment/work up at pt request Enlarging lung mass New Rt inguinal Lymph node Now scheduled for Rt Inguinal LAN bx Pt aware of procedure benefits and risks and agreeable to proceed Consent signed andin chart  Thank you for this interesting consult.  I greatly enjoyed meeting Brendan Hooper and look forward to participating in their care.    I spent a total of 40 minutes face to face in clinical consultation, greater than 50% of which was counseling/coordinating care for Rt  inguinal LAN bx  Signed: Shavette Shoaff A 08/08/2014, 9:01 AM

## 2014-08-08 NOTE — Progress Notes (Signed)
PROGRESS NOTE    Brendan Hooper SAY:[id number] DOB: 1946-03-14 DOA: 08/06/2014 PCP: Antonietta Jewel, MD  HPI/Brief narrative 68 year old male with history of hypertension, GERD, sickle cell anemia, ongoing tobacco abuse, evaluated in 2013 for a lung nodule and declined further evaluation, presented with cough, fever and chills. Chest x-ray reveals enlarging right middle lobe mass and postobstructive pneumonia. CT right hip shows large right inguinal region mass. Surgery and pulmonology have consulted.  Plans for biopsy of right inguinal mass/lymph node.   Assessment/Plan:  1. Enlarging right-sided lung mass/likely malignant:  Mass has enlarged to 9.7 cm.  Malignant until proven otherwise. Patient has refused work up in the past.  Pulmonology consulted and has signed off.  If Inguinal node bx is non-diagnostic we can call pulm back for a possible bronch bx.  Need to work towards a goals of care converation.  2. Postobstructive pneumonia: Still with low grade fever overnight.  Blood cultures are pending.  Continue IV Rocephin and azithromycin.  Patient appears symptomatically improved.    3. Large right inguinal mass: Surgical consultation appreciated. Likely represents necrotic lymph node. Surgeons were unable to aspirate any fluid. IR has consented the patient for bx.                                  4. Essential hypertension: Controlled  5. Tobacco abuse: Cessation counseled. Continue nicotine patch.  6. Possible alcohol abuse: Continue Ativan protocol. No overt withdrawal features currently.   Code Status: Full Family Communication: None at bedside and patient declines MD's offer to contact family at present time. Disposition Plan: Home when medically stable.   Consultants:  [facility]  Pulmonology  Interventional Radiology  Procedures:  Inguinal node BX pending.  Antibiotics:  IV Rocephin 12/20>  IV Azithromycin 12/20 >   Subjective: Denies complaints.  Breathing  comfortably.  Lives at home with a friend.  Objective: Filed Vitals:   08/07/14 1758 08/07/14 2131 08/08/14 0025 08/08/14 0549  BP: 138/73 136/80  122/67  Pulse: 98 103  73  Temp:  100.6 F (38.1 C) 100.7 F (38.2 C) 97.9 F (36.6 C)  TempSrc:  Oral Oral Oral  Resp:  18  18  Height:      Weight:    70.1 kg (154 lb 8.7 oz)  SpO2:  93%  99%    Intake/Output Summary (Last 24 hours) at 08/08/14 1333 Last data filed at 08/08/14 0644  Gross per 24 hour  Intake    300 ml  Output    300 ml  Net      0 ml   Filed Weights   08/07/14 0104 08/07/14 0500 08/08/14 0549  Weight: 68.811 kg (151 lb 11.2 oz) 69.128 kg (152 lb 6.4 oz) 70.1 kg (154 lb 8.7 oz)     Exam:  General exam: Thin male lying comfortably in bed. Appears older than stated age. HEENT:  Muddied sclera, MMM Respiratory system: Diminished breath sounds bilaterally without rhonchi, wheezing or crackles. No increased work of breathing. Cardiovascular system: S1 & S2 heard, RRR. No JVD, murmurs, gallops, clicks or pedal edema. Gastrointestinal system: Abdomen is thin, nondistended, soft and nontender. Normal bowel sounds heard. Central nervous system: Alert and oriented. No focal neurological deficits. Extremities: Symmetric 5 x 5 power. Large right inguinal hard mass (approx 4 cm in diameter) which is nonpulsatile or fluctuant. Mildly erythematous and mildly tender to palpation.     Data Reviewed: Basic Metabolic  Panel:  Recent Labs Lab 08/06/14 2026 08/07/14 0420  NA 135* 136*  K 3.6* 3.6*  CL 93* 95*  CO2 26 25  GLUCOSE 127* 100*  BUN 6 5*  CREATININE 0.57 0.46*  CALCIUM 9.4 9.0   Liver Function Tests:  Recent Labs Lab 08/07/14 0420  AST 23  ALT 9  ALKPHOS 80  BILITOT 1.0  PROT 7.0  ALBUMIN 2.5*   CBC:  Recent Labs Lab 08/06/14 2026 08/07/14 0420  WBC 11.8* 10.0  NEUTROABS 8.5* 7.0  HGB 16.1 14.7  HCT 44.7 41.2  MCV 76.4* 75.7*  PLT 334 312     Recent Results (from the past 240  hour(s))  Culture, blood (routine x 2) Call MD if unable to obtain prior to antibiotics being given     Status: None (Preliminary result)   Collection Time: 08/07/14  4:50 AM  Result Value Ref Range Status   Specimen Description BLOOD RIGHT HAND  Final   Special Requests BOTTLES DRAWN AEROBIC AND ANAEROBIC 10CC EA  Final   Culture  Setup Time   Final    08/07/2014 16:36 Performed at [facility]    Culture   Final           BLOOD CULTURE RECEIVED NO GROWTH TO DATE CULTURE WILL BE HELD FOR 5 DAYS BEFORE ISSUING A FINAL NEGATIVE REPORT Performed at [facility]    Report Status PENDING  Incomplete  Culture, blood (routine x 2) Call MD if unable to obtain prior to antibiotics being given     Status: None (Preliminary result)   Collection Time: 08/07/14  5:05 AM  Result Value Ref Range Status   Specimen Description BLOOD RIGHT ANTECUBITAL  Final   Special Requests BOTTLES DRAWN AEROBIC AND ANAEROBIC 10CC EA  Final   Culture  Setup Time   Final    08/07/2014 16:36 Performed at [facility]    Culture   Final           BLOOD CULTURE RECEIVED NO GROWTH TO DATE CULTURE WILL BE HELD FOR 5 DAYS BEFORE ISSUING A FINAL NEGATIVE REPORT Performed at [facility]    Report Status PENDING  Incomplete          Studies: Dg Chest 2 View  08/06/2014   CLINICAL DATA:  Acute onset of productive cough for 2 weeks. Current history of smoking. Initial encounter.  EXAM: CHEST  2 VIEW  COMPARISON:  Chest radiograph and CT of the chest performed 10/11/2011  FINDINGS: There is masslike consolidation and expansion of the right middle lobe. Given the prior pulmonary nodule within the right upper lobe at the right minor fissure in 2013, this is suspicious for a large 9.7 cm mass and adjacent postobstructive pneumonia.  The left lung appears relatively clear, aside from mildly increased interstitial markings, likely chronic in nature. No pleural effusion or pneumothorax is seen.   The heart remains normal in size. No acute osseous abnormalities are identified.  IMPRESSION: Masslike consolidation and expansion at the right middle lobe. Given the prior pulmonary nodule within the right upper lobe at the right minor fissure in 2013, this is suspicious for a large 9.7 cm mass and adjacent postobstructive pneumonia. CT of the chest would be helpful for further evaluation, when and as deemed clinically appropriate.   Electronically Signed   By: Garald Balding M.D.   On: 08/06/2014 21:27   Ct Hip Right W Contrast  08/06/2014   CLINICAL DATA:  Significant right  inguinal swelling over the past few days. Initial encounter.  EXAM: CT OF THE RIGHT HIP WITH CONTRAST  TECHNIQUE: Multidetector CT imaging was performed following the standard protocol during bolus administration of intravenous contrast.  CONTRAST:  143mL OMNIPAQUE IOHEXOL 300 MG/ML  SOLN  COMPARISON:  None.  FINDINGS: There is a large 4.6 x 3.8 x 4.0 cm peripherally enhancing collection of fluid at the right inguinal region. This may reflect abscess arising from a centrally necrotic node. This may reflect underlying infection extending up the right lower extremity, or focal right inguinal infection. Overlying soft tissue inflammation is seen, with associated skin thickening extending along the proximal anterior right thigh.  There is mildly asymmetric enhancement of additional smaller right inguinal nodes. There is no evidence of vascular compromise. The underlying vasculature is unremarkable in appearance.  The abscess abuts the adjacent quadriceps musculature. There is soft tissue inflammation along the fascia of the anterior right thigh.  No acute osseous abnormalities are seen.  Visualized small large bowel loops are grossly unremarkable. The bladder is mildly distended and unremarkable in appearance. A small amount of free fluid within the pelvis is of uncertain significance. The prostate remains normal in size.  IMPRESSION: 1.  Large 4.6 x 3.8 x 4.0 cm peripherally enhancing collection of fluid at the right inguinal region. This may reflect an abscess arising from a centrally necrotic node, due to underlying infection extending up the right lower extremity or focal right inguinal infection. Overlying soft tissue inflammation, with associated skin thickening extending along the proximal anterior right thigh. 2. Mildly asymmetric enhancement of the smaller right inguinal nodes. No evidence of vascular compromise. 3. Abscess abuts the adjacent quadriceps musculature, with soft tissue inflammation along the fascia of the anterior right thigh. 4. Small amount of free fluid within the pelvis, of uncertain significance.   Electronically Signed   By: Garald Balding M.D.   On: 08/06/2014 23:00        Scheduled Meds: . azithromycin  500 mg Intravenous Q24H  . cefTRIAXone (ROCEPHIN)  IV  1 g Intravenous Q24H  . folic acid  1 mg Oral Daily  . heparin  5,000 Units Subcutaneous 3 times per day  . Influenza vac split quadrivalent PF  0.5 mL Intramuscular Tomorrow-1000  . LORazepam  0-4 mg Oral Q6H   Followed by  . [START ON 08/09/2014] LORazepam  0-4 mg Oral Q12H  . multivitamin with minerals  1 tablet Oral Daily  . nicotine  21 mg Transdermal Daily  . thiamine  100 mg Oral Daily   Continuous Infusions:    Principal Problem:   Postobstructive pneumonia Active Problems:   Alcohol abuse   HTN (hypertension)   Current smoker   Lung mass   Abscess of right groin   Enlargement of lymph node    Time spent: 35 minutes    Karen Kitchens [facility] Pager [phone]  If 7PM-7AM, please contact night-coverage [email or web address] Password [email or web address] 08/08/2014, 1:33 PM    LOS: 2 days

## 2014-08-09 DIAGNOSIS — E876 Hypokalemia: Secondary | ICD-10-CM | POA: Insufficient documentation

## 2014-08-09 LAB — LEGIONELLA ANTIGEN, URINE

## 2014-08-09 LAB — BASIC METABOLIC PANEL
Anion gap: 11 (ref 5–15)
CALCIUM: 8.9 mg/dL (ref 8.4–10.5)
CO2: 29 mmol/L (ref 19–32)
CREATININE: 0.65 mg/dL (ref 0.50–1.35)
Chloride: 98 mEq/L (ref 96–112)
GFR calc Af Amer: >90 mL/min (ref >90)
GFR calc non Af Amer: >90 mL/min (ref >90)
Glucose, Bld: 117 mg/dL — ABNORMAL HIGH (ref 70–99)
Potassium: 3.1 mmol/L — ABNORMAL LOW (ref 3.5–5.1)
Sodium: 138 mmol/L (ref 135–145)

## 2014-08-09 LAB — CBC
HEMATOCRIT: 40.5 % (ref 39.0–52.0)
Hemoglobin: 14.4 g/dL (ref 13.0–17.0)
MCH: 27 pg (ref 26.0–34.0)
MCHC: 35.6 g/dL (ref 30.0–36.0)
MCV: 75.8 fL — ABNORMAL LOW (ref 78.0–100.0)
Platelets: 326 10*3/uL (ref 150–400)
RBC: 5.34 MIL/uL (ref 4.22–5.81)
RDW: 16 % — ABNORMAL HIGH (ref 11.5–15.5)
WBC: 7.7 10*3/uL (ref 4.0–10.5)

## 2014-08-09 MED ORDER — AMOXICILLIN-POT CLAVULANATE 875-125 MG PO TABS
1.0000 | ORAL_TABLET | Freq: Two times a day (BID) | ORAL | Status: DC
  Administered 2014-08-09 – 2014-08-11 (×5): 1 via ORAL
  Filled 2014-08-09 (×7): qty 1

## 2014-08-09 MED ORDER — POTASSIUM CHLORIDE CRYS ER 20 MEQ PO TBCR
40.0000 meq | EXTENDED_RELEASE_TABLET | ORAL | Status: AC
  Administered 2014-08-09 (×2): 40 meq via ORAL
  Filled 2014-08-09 (×2): qty 2

## 2014-08-09 NOTE — Progress Notes (Signed)
PROGRESS NOTE    Brendan Hooper ZDG:[id number] DOB: 1946/05/02 DOA: 08/06/2014 PCP: Antonietta Jewel, MD  HPI/Brief narrative 69 year old male with history of hypertension, GERD, sickle cell anemia, ongoing tobacco abuse, evaluated in 2013 for a lung nodule and declined further evaluation, presented with cough, fever and chills. Chest x-ray reveals enlarging right middle lobe mass and postobstructive pneumonia. CT right hip shows large right inguinal region mass. Surgery and pulmonology, and IR consulted.   Assessment/Plan:  Enlarging right-sided lung mass/likely malignant:  Mass has enlarged to 9.7 cm.  Malignant until proven otherwise. Patient has refused work up in the past.  Pulmonology consulted and has signed off.  If Inguinal node bx is non-diagnostic we can call pulm back for a possible bronch bx.    Postobstructive pneumonia: Still with low grade fever overnight.  Blood cultures are pending.  Initially treated with IV Rocephin and azithromycin.  On 12/22 changed to Augmentin.   Patient appears symptomatically improved.    Large right inguinal mass: Surgical consultation appreciated. Likely represents necrotic lymph node. IR performed biopsy and aspiration on 12/21.  Path and culture results are pending.         Abscess surrounding right inguinal mass: Ortho evaluated and felt antibiotic would likely treat this infection.  IR drew fluid which was sent for cell count differential, and culture.                           Essential hypertension: Controlled  Tobacco abuse: Cessation counseled. Continue nicotine patch.  Possible alcohol abuse: Continue Ativan protocol. No withdrawal features currently.   Code Status: Full Family Communication: None at bedside and patient declines MD's offer to contact family at present time. Disposition Plan: Home when medically stable.   Consultants:  [facility]  Pulmonology  Interventional Radiology  Ortho  Procedures:  Inguinal node BX 12/21  by IR.  Antibiotics: Anti-infectives    Start     Dose/Rate Route Frequency Ordered Stop   08/09/14 1000  amoxicillin-clavulanate (AUGMENTIN) 875-125 MG per tablet 1 tablet     1 tablet Oral Every 12 hours 08/09/14 0942     08/07/14 2300  cefTRIAXone (ROCEPHIN) 1 g in dextrose 5 % 50 mL IVPB - Premix  Status:  Discontinued     1 g100 mL/hr over 30 Minutes Intravenous Every 24 hours 08/07/14 1538 08/09/14 0942   08/07/14 2200  azithromycin (ZITHROMAX) 500 mg in dextrose 5 % 250 mL IVPB  Status:  Discontinued     500 mg250 mL/hr over 60 Minutes Intravenous Every 24 hours 08/07/14 1538 08/09/14 0942   08/06/14 2330  cefTRIAXone (ROCEPHIN) 1 g in dextrose 5 % 50 mL IVPB     1 g100 mL/hr over 30 Minutes Intravenous  Once 08/06/14 2326 08/07/14 0016   08/06/14 2330  azithromycin (ZITHROMAX) 500 mg in dextrose 5 % 250 mL IVPB     500 mg250 mL/hr over 60 Minutes Intravenous  Once 08/06/14 2326 08/07/14 0133      Subjective: Describes some soreness at biopsy sight.  Otherwise ok.  Reports bowels are loose.  Objective: Filed Vitals:   08/08/14 1801 08/08/14 2111 08/09/14 0500 08/09/14 0515  BP: 144/73 134/71  134/70  Pulse: 93 102  89  Temp: 98.3 F (36.8 C) 98.7 F (37.1 C)  98 F (36.7 C)  TempSrc: Oral     Resp: 18 18  18   Height:      Weight:   68.5 kg (151 lb  0.2 oz)   SpO2: 97% 90%  100%    Intake/Output Summary (Last 24 hours) at 08/09/14 1204 Last data filed at 08/09/14 0954  Gross per 24 hour  Intake    360 ml  Output      0 ml  Net    360 ml   Filed Weights   08/07/14 0500 08/08/14 0549 08/09/14 0500  Weight: 69.128 kg (152 lb 6.4 oz) 70.1 kg (154 lb 8.7 oz) 68.5 kg (151 lb 0.2 oz)     Exam:  General exam: Thin male standing beside the bed.  Appears older than stated age. HEENT:  Muddied sclera, MMM Respiratory system: Diminished breath sounds bilaterally without rhonchi, wheezing or crackles. No increased work of breathing. Cardiovascular system: S1 & S2 heard,  RRR. No JVD, murmurs, gallops, clicks or pedal edema. Gastrointestinal system: Abdomen is thin, nondistended, soft and nontender. Normal bowel sounds heard. Central nervous system: Alert and oriented. No focal neurological deficits. Extremities: Symmetric 5 x 5 power. Large right inguinal hard mass (approx 4 cm in diameter) which is nonpulsatile or fluctuant. Mildly erythematous and mildly tender to palpation.  Bandaid in place.  The mass does not appear to have decreased after aspiration.   Data Reviewed: Basic Metabolic Panel:  Recent Labs Lab 08/06/14 2026 08/07/14 0420 08/09/14 0620  NA 135* 136* 138  K 3.6* 3.6* 3.1*  CL 93* 95* 98  CO2 26 25 29   GLUCOSE 127* 100* 117*  BUN 6 5* <5*  CREATININE 0.57 0.46* 0.65  CALCIUM 9.4 9.0 8.9   Liver Function Tests:  Recent Labs Lab 08/07/14 0420  AST 23  ALT 9  ALKPHOS 80  BILITOT 1.0  PROT 7.0  ALBUMIN 2.5*   CBC:  Recent Labs Lab 08/06/14 2026 08/07/14 0420 08/09/14 0620  WBC 11.8* 10.0 7.7  NEUTROABS 8.5* 7.0  --   HGB 16.1 14.7 14.4  HCT 44.7 41.2 40.5  MCV 76.4* 75.7* 75.8*  PLT 334 312 326     Recent Results (from the past 240 hour(s))  Culture, blood (routine x 2) Call MD if unable to obtain prior to antibiotics being given     Status: None (Preliminary result)   Collection Time: 08/07/14  4:50 AM  Result Value Ref Range Status   Specimen Description BLOOD RIGHT HAND  Final   Special Requests BOTTLES DRAWN AEROBIC AND ANAEROBIC 10CC EA  Final   Culture  Setup Time   Final    08/07/2014 16:36 Performed at [facility]    Culture   Final           BLOOD CULTURE RECEIVED NO GROWTH TO DATE CULTURE WILL BE HELD FOR 5 DAYS BEFORE ISSUING A FINAL NEGATIVE REPORT Performed at [facility]    Report Status PENDING  Incomplete  Culture, blood (routine x 2) Call MD if unable to obtain prior to antibiotics being given     Status: None (Preliminary result)   Collection Time: 08/07/14  5:05 AM    Result Value Ref Range Status   Specimen Description BLOOD RIGHT ANTECUBITAL  Final   Special Requests BOTTLES DRAWN AEROBIC AND ANAEROBIC 10CC EA  Final   Culture  Setup Time   Final    08/07/2014 16:36 Performed at [facility]    Culture   Final           BLOOD CULTURE RECEIVED NO GROWTH TO DATE CULTURE WILL BE HELD FOR 5 DAYS BEFORE ISSUING A FINAL NEGATIVE REPORT  Performed at [facility]    Report Status PENDING  Incomplete          Studies: US Biopsy  08/08/2014   CLINICAL DATA:  Palpable right inguinal nodal mass  EXAM: ULTRASOUND GUIDED CORE BIOPSY OF RIGHT INGUINAL NODAL MASS  MEDICATIONS: 1 mg IV Versed; 50 mcg IV Fentanyl  Total Moderate Sedation Time: 15  PROCEDURE: The procedure, risks, benefits, and alternatives were explained to the patient. Questions regarding the procedure were encouraged and answered. The patient understands and consents to the procedure.  The right inguinal area was prepped with Betadine in a sterile fashion, and a sterile drape was applied covering the operative field. A sterile gown and sterile gloves were used for the procedure. Local anesthesia was provided with 1% Lidocaine.  Previous imaging reviewed. Preliminary ultrasound performed. The right inguinal nodal mass was localized. Under sterile conditions and local anesthesia, an 18 gauge core needle biopsy device was advanced to the right inguinal abnormality. Several core biopsies obtained. Samples placed in saline. No immediate complication. Patient tolerated the biopsy well. No hemorrhage or hematoma following the biopsy.  COMPLICATIONS: None.  FINDINGS: Imaging confirms needle placed in the right inguinal nodal mass for core biopsy  IMPRESSION: Successful right inguinal nodal mass ultrasound core biopsy   Electronically Signed   By: Daryll Brod M.D.   On: 08/08/2014 17:07        Scheduled Meds: . amoxicillin-clavulanate  1 tablet Oral Q12H  . folic acid  1 mg Oral Daily   . heparin  5,000 Units Subcutaneous 3 times per day  . LORazepam  0-4 mg Oral Q12H  . multivitamin with minerals  1 tablet Oral Daily  . nicotine  21 mg Transdermal Daily  . thiamine  100 mg Oral Daily   Continuous Infusions:    Principal Problem:   Postobstructive pneumonia Active Problems:   Alcohol abuse   HTN (hypertension)   Current smoker   Lung mass   Abscess of right groin   Enlargement of lymph node   Protein-calorie malnutrition, severe    Time spent: 35 minutes    Karen Kitchens [facility] Pager [phone]  If 7PM-7AM, please contact night-coverage [email or web address] Password [email or web address] 08/09/2014, 12:04 PM    LOS: 3 days

## 2014-08-10 LAB — BASIC METABOLIC PANEL
Anion gap: 9 (ref 5–15)
BUN: <5 mg/dL — ABNORMAL LOW (ref 6–23)
CO2: 24 mmol/L (ref 19–32)
Calcium: 8.5 mg/dL (ref 8.4–10.5)
Chloride: 100 mEq/L (ref 96–112)
Creatinine, Ser: 0.58 mg/dL (ref 0.50–1.35)
GFR calc Af Amer: >90 mL/min (ref >90)
GLUCOSE: 127 mg/dL — AB (ref 70–99)
POTASSIUM: 3.2 mmol/L — AB (ref 3.5–5.1)
SODIUM: 133 mmol/L — AB (ref 135–145)

## 2014-08-10 LAB — MAGNESIUM: MAGNESIUM: 1.9 mg/dL (ref 1.5–2.5)

## 2014-08-10 MED ORDER — POTASSIUM CHLORIDE CRYS ER 20 MEQ PO TBCR
40.0000 meq | EXTENDED_RELEASE_TABLET | ORAL | Status: AC
  Administered 2014-08-10 (×2): 40 meq via ORAL
  Filled 2014-08-10 (×2): qty 2

## 2014-08-10 NOTE — Progress Notes (Signed)
PROGRESS NOTE    Brendan Hooper AGT:[id number] DOB: Jul 13, 1946 DOA: 08/06/2014 PCP: Antonietta Jewel, MD  HPI/Brief narrative 68 year old male with history of hypertension, GERD, sickle cell anemia, ongoing tobacco abuse, evaluated in 2013 for a lung nodule and declined further evaluation, presented with cough, fever and chills. Chest x-ray reveals enlarging right middle lobe mass and postobstructive pneumonia. CT right hip shows large right inguinal region mass. Surgery and pulmonology, and IR consulted.   Assessment/Plan:  Enlarging right-sided lung mass/likely malignant: Mass has enlarged to 9.7 cm. Malignant until proven otherwise. Patient has refused work up in the past. Pulmonology consulted and has signed off. If Inguinal node bx is non-diagnostic we can call pulm back for a possible bronch bx.   Postobstructive pneumonia: Still with low grade fever overnight. Blood cultures are negative to date. Initially treated with IV Rocephin and azithromycin. On 12/22 changed to Augmentin to cover for pneumonia and minimal right thigh abscess. Patient appears symptomatically improved.   Large right inguinal mass: Surgical consultation appreciated. Likely represents necrotic lymph node. IR performed biopsy and aspiration on 12/21. Path and culture results are pending. Discussed with pathology-results expected 12/24.   Abscess surrounding right inguinal mass: Ortho evaluated and felt antibiotic would likely treat this infection. IR drew fluid which was sent for cell count differential, and culture.   Essential hypertension: Controlled  Tobacco abuse: Cessation counseled. Continue nicotine patch.  Possible alcohol abuse: Continue Ativan protocol. No withdrawal features currently.  Hypokalemia: Replace and follow BMP   Code Status: Full Family Communication: None at bedside and patient declines MD's offer to contact family at present time. Disposition  Plan: Home when medically stable.   Consultants:  [facility]  Pulmonology  Interventional Radiology  Ortho  Procedures:  Inguinal node BX 12/21 by IR.  Subjective: Decreased soreness at the right inguinal node lymph node biopsy site. Denies any other complaints.  Objective: Filed Vitals:   08/09/14 2133 08/10/14 0454 08/10/14 0530 08/10/14 1331  BP: 132/66  130/75 134/70  Pulse: 98  95 86  Temp: 98.7 F (37.1 C)  98.5 F (36.9 C) 98.7 F (37.1 C)  TempSrc: Oral  Oral Oral  Resp: 18  18 16   Height:      Weight:  68.9 kg (151 lb 14.4 oz)    SpO2: 95%  94% 93%    Intake/Output Summary (Last 24 hours) at 08/10/14 1618 Last data filed at 08/10/14 1400  Gross per 24 hour  Intake   1080 ml  Output      0 ml  Net   1080 ml   Filed Weights   08/08/14 0549 08/09/14 0500 08/10/14 0454  Weight: 70.1 kg (154 lb 8.7 oz) 68.5 kg (151 lb 0.2 oz) 68.9 kg (151 lb 14.4 oz)     Exam:  General exam: Thin male standing beside the bed. Appears older than stated age. HEENT: Muddied sclera, MMM Respiratory system: Diminished breath sounds bilaterally without rhonchi, wheezing or crackles. No increased work of breathing. Cardiovascular system: S1 & S2 heard, RRR. No JVD, murmurs, gallops, clicks or pedal edema. Gastrointestinal system: Abdomen is thin, nondistended, soft and nontender. Normal bowel sounds heard. Central nervous system: Alert and oriented. No focal neurological deficits. Extremities: Symmetric 5 x 5 power. Large right inguinal hard mass (approx 4 cm in diameter) which is nonpulsatile or fluctuant. Mildly erythematous and mildly tender to palpation.   Data Reviewed: Basic Metabolic Panel:  Recent Labs Lab 08/06/14 2026 08/07/14 0420 08/09/14 0620 08/10/14 0630  NA 135*  136* 138 133*  K 3.6* 3.6* 3.1* 3.2*  CL 93* 95* 98 100  CO2 26 25 29 24   GLUCOSE 127* 100* 117* 127*  BUN 6 5* <5* <5*  CREATININE 0.57 0.46* 0.65 0.58  CALCIUM 9.4 9.0 8.9 8.5  MG  --    --   --  1.9   Liver Function Tests:  Recent Labs Lab 08/07/14 0420  AST 23  ALT 9  ALKPHOS 80  BILITOT 1.0  PROT 7.0  ALBUMIN 2.5*   No results for input(s): LIPASE, AMYLASE in the last 168 hours. No results for input(s): AMMONIA in the last 168 hours. CBC:  Recent Labs Lab 08/06/14 2026 08/07/14 0420 08/09/14 0620  WBC 11.8* 10.0 7.7  NEUTROABS 8.5* 7.0  --   HGB 16.1 14.7 14.4  HCT 44.7 41.2 40.5  MCV 76.4* 75.7* 75.8*  PLT 334 312 326   Cardiac Enzymes: No results for input(s): CKTOTAL, CKMB, CKMBINDEX, TROPONINI in the last 168 hours. BNP (last 3 results) No results for input(s): PROBNP in the last 8760 hours. CBG: No results for input(s): GLUCAP in the last 168 hours.  Recent Results (from the past 240 hour(s))  Culture, blood (routine x 2) Call MD if unable to obtain prior to antibiotics being given     Status: None (Preliminary result)   Collection Time: 08/07/14  4:50 AM  Result Value Ref Range Status   Specimen Description BLOOD RIGHT HAND  Final   Special Requests BOTTLES DRAWN AEROBIC AND ANAEROBIC 10CC EA  Final   Culture  Setup Time   Final    08/07/2014 16:36 Performed at [facility]    Culture   Final           BLOOD CULTURE RECEIVED NO GROWTH TO DATE CULTURE WILL BE HELD FOR 5 DAYS BEFORE ISSUING A FINAL NEGATIVE REPORT Performed at [facility]    Report Status PENDING  Incomplete  Culture, blood (routine x 2) Call MD if unable to obtain prior to antibiotics being given     Status: None (Preliminary result)   Collection Time: 08/07/14  5:05 AM  Result Value Ref Range Status   Specimen Description BLOOD RIGHT ANTECUBITAL  Final   Special Requests BOTTLES DRAWN AEROBIC AND ANAEROBIC 10CC EA  Final   Culture  Setup Time   Final    08/07/2014 16:36 Performed at [facility]    Culture   Final           BLOOD CULTURE RECEIVED NO GROWTH TO DATE CULTURE WILL BE HELD FOR 5 DAYS BEFORE ISSUING A FINAL NEGATIVE  REPORT Performed at [facility]    Report Status PENDING  Incomplete  Tissue culture     Status: None (Preliminary result)   Collection Time: 08/08/14  4:50 PM  Result Value Ref Range Status   Specimen Description TISSUE LYMPH NODE  Final   Special Requests RIGHT INGUINAL NODE  Final   Gram Stain   Final    NO WBC SEEN NO ORGANISMS SEEN Performed at [facility]    Culture   Final    NO GROWTH 1 DAY Performed at [facility]    Report Status PENDING  Incomplete           Studies: US Biopsy  08/08/2014   CLINICAL DATA:  Palpable right inguinal nodal mass  EXAM: ULTRASOUND GUIDED CORE BIOPSY OF RIGHT INGUINAL NODAL MASS  MEDICATIONS: 1 mg IV Versed; 50 mcg IV  Fentanyl  Total Moderate Sedation Time: 15  PROCEDURE: The procedure, risks, benefits, and alternatives were explained to the patient. Questions regarding the procedure were encouraged and answered. The patient understands and consents to the procedure.  The right inguinal area was prepped with Betadine in a sterile fashion, and a sterile drape was applied covering the operative field. A sterile gown and sterile gloves were used for the procedure. Local anesthesia was provided with 1% Lidocaine.  Previous imaging reviewed. Preliminary ultrasound performed. The right inguinal nodal mass was localized. Under sterile conditions and local anesthesia, an 18 gauge core needle biopsy device was advanced to the right inguinal abnormality. Several core biopsies obtained. Samples placed in saline. No immediate complication. Patient tolerated the biopsy well. No hemorrhage or hematoma following the biopsy.  COMPLICATIONS: None.  FINDINGS: Imaging confirms needle placed in the right inguinal nodal mass for core biopsy  IMPRESSION: Successful right inguinal nodal mass ultrasound core biopsy   Electronically Signed   By: Daryll Brod M.D.   On: 08/08/2014 17:07        Scheduled Meds: . amoxicillin-clavulanate  1  tablet Oral Q12H  . folic acid  1 mg Oral Daily  . heparin  5,000 Units Subcutaneous 3 times per day  . LORazepam  0-4 mg Oral Q12H  . multivitamin with minerals  1 tablet Oral Daily  . nicotine  21 mg Transdermal Daily  . thiamine  100 mg Oral Daily   Continuous Infusions:   Principal Problem:   Postobstructive pneumonia Active Problems:   Alcohol abuse   HTN (hypertension)   Current smoker   Lung mass   Abscess of right groin   Enlargement of lymph node   Protein-calorie malnutrition, severe   Hypokalemia    Time spent: 25 minutes.    Vernell Leep, MD, FACP, FHM. [facility] Pager [phone]  If 7PM-7AM, please contact night-coverage [email or web address] Password [email or web address] 08/10/2014, 4:18 PM    LOS: 4 days

## 2014-08-11 ENCOUNTER — Encounter: Payer: Self-pay | Admitting: *Deleted

## 2014-08-11 DIAGNOSIS — C799 Secondary malignant neoplasm of unspecified site: Secondary | ICD-10-CM

## 2014-08-11 DIAGNOSIS — C3490 Malignant neoplasm of unspecified part of unspecified bronchus or lung: Secondary | ICD-10-CM

## 2014-08-11 LAB — BASIC METABOLIC PANEL
ANION GAP: 8 (ref 5–15)
BUN: <5 mg/dL — ABNORMAL LOW (ref 6–23)
CALCIUM: 9.1 mg/dL (ref 8.4–10.5)
CO2: 26 mmol/L (ref 19–32)
CREATININE: 0.59 mg/dL (ref 0.50–1.35)
Chloride: 101 mEq/L (ref 96–112)
GFR calc Af Amer: >90 mL/min (ref >90)
Glucose, Bld: 108 mg/dL — ABNORMAL HIGH (ref 70–99)
Potassium: 4 mmol/L (ref 3.5–5.1)
Sodium: 135 mmol/L (ref 135–145)

## 2014-08-11 MED ORDER — FOLIC ACID 1 MG PO TABS: 1.0000 mg | ORAL_TABLET | Freq: Every day | ORAL | Status: DC

## 2014-08-11 MED ORDER — THIAMINE HCL 100 MG PO TABS: 100.0000 mg | ORAL_TABLET | Freq: Every day | ORAL | Status: DC

## 2014-08-11 MED ORDER — AMOXICILLIN-POT CLAVULANATE 875-125 MG PO TABS: 1.0000 | ORAL_TABLET | Freq: Two times a day (BID) | ORAL | Status: DC

## 2014-08-11 MED ORDER — ACETAMINOPHEN 325 MG PO TABS: 650.0000 mg | ORAL_TABLET | Freq: Four times a day (QID) | ORAL | Status: DC | PRN

## 2014-08-11 MED ORDER — ADULT MULTIVITAMIN W/MINERALS CH: 1.0000 | ORAL_TABLET | Freq: Every day | ORAL | Status: DC

## 2014-08-11 MED ORDER — NICOTINE 21 MG/24HR TD PT24: 21.0000 mg | MEDICATED_PATCH | Freq: Every day | TRANSDERMAL | Status: DC

## 2014-08-11 NOTE — Discharge Summary (Addendum)
Physician Discharge Summary  Brendan Hooper BOF:[id number] DOB: 05-07-1946 DOA: 08/06/2014  PCP: Antonietta Jewel, MD  Admit date: 08/06/2014 Discharge date: 08/11/2014  Time spent: Greater than 30 minutes  Recommendations for Outpatient Follow-up:  1. [facility]/PCP on 08/22/14 at 3:15 PM. Follow-up final right inguinal node site culture sensitivity results. 2. Cancer Center/oncology: On 08/25/14 at 1:45 PM for labs and at 2 PM for consultation with M.D.  Discharge Diagnoses:  Principal Problem:   Postobstructive pneumonia Active Problems:   Alcohol abuse   HTN (hypertension)   Current smoker   Lung mass   Abscess of right groin   Enlargement of lymph node   Protein-calorie malnutrition, severe   Hypokalemia   Discharge Condition: Improved & Stable  Diet recommendation: Regular diet  Filed Weights   08/09/14 0500 08/10/14 0454 08/11/14 0546  Weight: 68.5 kg (151 lb 0.2 oz) 68.9 kg (151 lb 14.4 oz) 67.1 kg (147 lb 14.9 oz)    History of present illness:  68 year old male with history of hypertension, GERD, sickle cell anemia, ongoing tobacco abuse, evaluated in 2013 for a lung nodule and declined further evaluation, presented with cough, fever and chills. Chest x-ray reveals enlarging right middle lobe mass and postobstructive pneumonia. CT right hip shows large right inguinal region mass. Surgery and pulmonology, and IR consulted.  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 Patient has refused work up in the past. Pulmonology consulted and has signed off. Right Inguinal lymph node confirmed cancer diagnosis. Discussed with Dr. Ladell Pier, oncology who has arranged for outpatient consultation by oncology on 08/25/13. Called and discussed extensively with patient's sister and advised her to make sure that patient shows up for the appointment. She  verbalized understanding.  Postobstructive pneumonia: Blood cultures are negative to date. Initially treated with IV Rocephin and azithromycin. On 12/22 changed to Augmentin to cover for pneumonia and minimal right thigh abscess.Patient asymptomatic. He has completed approximately 5 days of inpatient antibiotics. We'll discharge on oral Augmentin to complete total 10 days treatment   Large right inguinal mass: Surgical consultation appreciated. Likely represents necrotic lymph node. IR performed biopsy and aspiration on 12/21.Pathology confirms cancer diagnosis. Culture is negative to date.   Abscess surrounding right inguinal mass: Ortho evaluated and felt antibiotic would likely treat this infection. IR drew fluid which was sent for cell count differential, and culture. Cultures are negative to date. Abscess seems to have clinically resolved. Treat for an additional 5 days with oral Augmentin.   Essential hypertension: Controlled  Tobacco abuse: Cessation counseled. Continue nicotine patch.  Possible alcohol abuse: Continue Ativan protocol. No withdrawal features.  Hypokalemia: Replaced  Severe malnutrition in the context of chronic illness: Management per dietitian. Encouraged regular diet.  Consultants:  [facility]  Pulmonology  Interventional Radiology  Ortho  Procedures:  Inguinal node BX 12/21 by IR.   Discharge Exam:  Complaints: Denies complaints. Denies pain in the right groin swelling site.  Filed Vitals:   08/10/14 0530 08/10/14 1331 08/10/14 2107 08/11/14 0546  BP: 130/75 134/70 150/80 136/67  Pulse: 95 86  92  Temp: 98.5 F (36.9 C) 98.7 F (37.1 C) 98.7 F (37.1 C) 98.5 F (36.9 C)  TempSrc: Oral Oral Oral Oral  Resp: 18 16 16 16   Height:      Weight:    67.1 kg (147 lb 14.9 oz)  SpO2: 94% 93% 95% 91%  General exam: Pleasant middle-aged male lying comfortably in bed. Respiratory system: Diminished breath sounds  bilaterally without rhonchi, wheezing or crackles. No increased work of breathing. Cardiovascular system: S1 & S2 heard, RRR. No JVD, murmurs, gallops, clicks or pedal edema. Gastrointestinal system: Abdomen is thin, nondistended, soft and nontender. Normal bowel sounds heard. Central nervous system: Alert and oriented. No focal neurological deficits. Extremities: Symmetric 5 x 5 power. Large right inguinal hard mass (approx 4 cm in diameter) which is nonpulsatile or fluctuant. Mildly erythematous and non-tender to palpation.No areas of fluctuation. Right upper thigh otherwise without acute findings.  Discharge Instructions      Discharge Instructions    Call MD for:  difficulty breathing, headache or visual disturbances    Complete by:  As directed      Call MD for:  redness, tenderness, or signs of infection (pain, swelling, redness, odor or green/yellow discharge around incision site)    Complete by:  As directed      Call MD for:  severe uncontrolled pain    Complete by:  As directed      Call MD for:  temperature >100.4    Complete by:  As directed      Diet general    Complete by:  As directed      Increase activity slowly    Complete by:  As directed             Medication List    STOP taking these medications        ALEVE 220 MG tablet  Generic drug:  naproxen sodium     ALKA SELTZER PLUS PO     NYQUIL PO      TAKE these medications        acetaminophen 325 MG tablet  Commonly known as:  TYLENOL  Take 2 tablets (650 mg total) by mouth every 6 (six) hours as needed for mild pain, moderate pain or fever.     amoxicillin-clavulanate 875-125 MG per tablet  Commonly known as:  AUGMENTIN  Take 1 tablet by mouth every 12 (twelve) hours.     folic acid 1 MG tablet  Commonly known as:  FOLVITE  Take 1 tablet (1 mg total) by mouth daily.     multivitamin with minerals Tabs tablet  Take 1 tablet by mouth daily.     nicotine 21 mg/24hr patch  Commonly known as:   NICODERM CQ - dosed in mg/24 hours  Place 1 patch (21 mg total) onto the skin daily.     thiamine 100 MG tablet  Take 1 tablet (100 mg total) by mouth daily.       Follow-up Information    Follow up with [facility]     On 08/22/2014.   Why:  3:15 for hosptial follow up   Contact information:   [address] [place] [address] [phone]       The results of significant diagnostics from this hospitalization (including imaging, microbiology, ancillary and laboratory) are listed below for reference.    Significant Diagnostic Studies: Dg Chest 2 View  08/06/2014   CLINICAL DATA:  Acute onset of productive cough for 2 weeks. Current history of smoking. Initial encounter.  EXAM: CHEST  2 VIEW  COMPARISON:  Chest radiograph and CT of the chest performed 10/11/2011  FINDINGS: There is masslike consolidation and expansion of the right middle lobe. Given the prior pulmonary nodule within the right upper lobe at the right  minor fissure in 2013, this is suspicious for a large 9.7 cm mass and adjacent postobstructive pneumonia.  The left lung appears relatively clear, aside from mildly increased interstitial markings, likely chronic in nature. No pleural effusion or pneumothorax is seen.  The heart remains normal in size. No acute osseous abnormalities are identified.  IMPRESSION: Masslike consolidation and expansion at the right middle lobe. Given the prior pulmonary nodule within the right upper lobe at the right minor fissure in 2013, this is suspicious for a large 9.7 cm mass and adjacent postobstructive pneumonia. CT of the chest would be helpful for further evaluation, when and as deemed clinically appropriate.   Electronically Signed   By: Garald Balding M.D.   On: 08/06/2014 21:27   Ct Hip Right W Contrast  08/06/2014   CLINICAL DATA:  Significant right inguinal swelling over the past few days. Initial encounter.  EXAM: CT OF THE RIGHT HIP  WITH CONTRAST  TECHNIQUE: Multidetector CT imaging was performed following the standard protocol during bolus administration of intravenous contrast.  CONTRAST:  171mL OMNIPAQUE IOHEXOL 300 MG/ML  SOLN  COMPARISON:  None.  FINDINGS: There is a large 4.6 x 3.8 x 4.0 cm peripherally enhancing collection of fluid at the right inguinal region. This may reflect abscess arising from a centrally necrotic node. This may reflect underlying infection extending up the right lower extremity, or focal right inguinal infection. Overlying soft tissue inflammation is seen, with associated skin thickening extending along the proximal anterior right thigh.  There is mildly asymmetric enhancement of additional smaller right inguinal nodes. There is no evidence of vascular compromise. The underlying vasculature is unremarkable in appearance.  The abscess abuts the adjacent quadriceps musculature. There is soft tissue inflammation along the fascia of the anterior right thigh.  No acute osseous abnormalities are seen.  Visualized small large bowel loops are grossly unremarkable. The bladder is mildly distended and unremarkable in appearance. A small amount of free fluid within the pelvis is of uncertain significance. The prostate remains normal in size.  IMPRESSION: 1. Large 4.6 x 3.8 x 4.0 cm peripherally enhancing collection of fluid at the right inguinal region. This may reflect an abscess arising from a centrally necrotic node, due to underlying infection extending up the right lower extremity or focal right inguinal infection. Overlying soft tissue inflammation, with associated skin thickening extending along the proximal anterior right thigh. 2. Mildly asymmetric enhancement of the smaller right inguinal nodes. No evidence of vascular compromise. 3. Abscess abuts the adjacent quadriceps musculature, with soft tissue inflammation along the fascia of the anterior right thigh. 4. Small amount of free fluid within the pelvis, of  uncertain significance.   Electronically Signed   By: Garald Balding M.D.   On: 08/06/2014 23:00   US Biopsy  08/08/2014   CLINICAL DATA:  Palpable right inguinal nodal mass  EXAM: ULTRASOUND GUIDED CORE BIOPSY OF RIGHT INGUINAL NODAL MASS  MEDICATIONS: 1 mg IV Versed; 50 mcg IV Fentanyl  Total Moderate Sedation Time: 15  PROCEDURE: The procedure, risks, benefits, and alternatives were explained to the patient. Questions regarding the procedure were encouraged and answered. The patient understands and consents to the procedure.  The right inguinal area was prepped with Betadine in a sterile fashion, and a sterile drape was applied covering the operative field. A sterile gown and sterile gloves were used for the procedure. Local anesthesia was provided with 1% Lidocaine.  Previous imaging reviewed. Preliminary ultrasound performed. The right inguinal nodal mass was localized.  Under sterile conditions and local anesthesia, an 18 gauge core needle biopsy device was advanced to the right inguinal abnormality. Several core biopsies obtained. Samples placed in saline. No immediate complication. Patient tolerated the biopsy well. No hemorrhage or hematoma following the biopsy.  COMPLICATIONS: None.  FINDINGS: Imaging confirms needle placed in the right inguinal nodal mass for core biopsy  IMPRESSION: Successful right inguinal nodal mass ultrasound core biopsy   Electronically Signed   By: Daryll Brod M.D.   On: 08/08/2014 17:07    Microbiology: Recent Results (from the past 240 hour(s))  Culture, blood (routine x 2) Call MD if unable to obtain prior to antibiotics being given     Status: None (Preliminary result)   Collection Time: 08/07/14  4:50 AM  Result Value Ref Range Status   Specimen Description BLOOD RIGHT HAND  Final   Special Requests BOTTLES DRAWN AEROBIC AND ANAEROBIC 10CC EA  Final   Culture  Setup Time   Final    08/07/2014 16:36 Performed at [facility]    Culture   Final            BLOOD CULTURE RECEIVED NO GROWTH TO DATE CULTURE WILL BE HELD FOR 5 DAYS BEFORE ISSUING A FINAL NEGATIVE REPORT Performed at [facility]    Report Status PENDING  Incomplete  Culture, blood (routine x 2) Call MD if unable to obtain prior to antibiotics being given     Status: None (Preliminary result)   Collection Time: 08/07/14  5:05 AM  Result Value Ref Range Status   Specimen Description BLOOD RIGHT ANTECUBITAL  Final   Special Requests BOTTLES DRAWN AEROBIC AND ANAEROBIC 10CC EA  Final   Culture  Setup Time   Final    08/07/2014 16:36 Performed at [facility]    Culture   Final           BLOOD CULTURE RECEIVED NO GROWTH TO DATE CULTURE WILL BE HELD FOR 5 DAYS BEFORE ISSUING A FINAL NEGATIVE REPORT Performed at [facility]    Report Status PENDING  Incomplete  Tissue culture     Status: None (Preliminary result)   Collection Time: 08/08/14  4:50 PM  Result Value Ref Range Status   Specimen Description TISSUE LYMPH NODE  Final   Special Requests RIGHT INGUINAL NODE  Final   Gram Stain   Final    NO WBC SEEN NO ORGANISMS SEEN Performed at [facility]    Culture   Final    NO GROWTH 2 DAYS Performed at [facility]    Report Status PENDING  Incomplete     Labs: Basic Metabolic Panel:  Recent Labs Lab 08/06/14 2026 08/07/14 0420 08/09/14 0620 08/10/14 0630 08/11/14 0555  NA 135* 136* 138 133* 135  K 3.6* 3.6* 3.1* 3.2* 4.0  CL 93* 95* 98 100 101  CO2 26 25 29 24 26   GLUCOSE 127* 100* 117* 127* 108*  BUN 6 5* <5* <5* <5*  CREATININE 0.57 0.46* 0.65 0.58 0.59  CALCIUM 9.4 9.0 8.9 8.5 9.1  MG  --   --   --  1.9  --    Liver Function Tests:  Recent Labs Lab 08/07/14 0420  AST 23  ALT 9  ALKPHOS 80  BILITOT 1.0  PROT 7.0  ALBUMIN 2.5*   No results for input(s): LIPASE, AMYLASE in the last 168 hours. No results for input(s): AMMONIA in the last 168 hours. CBC:  Recent Labs Lab  08/06/14 2026 08/07/14 0420  08/09/14 0620  WBC 11.8* 10.0 7.7  NEUTROABS 8.5* 7.0  --   HGB 16.1 14.7 14.4  HCT 44.7 41.2 40.5  MCV 76.4* 75.7* 75.8*  PLT 334 312 326   Cardiac Enzymes: No results for input(s): CKTOTAL, CKMB, CKMBINDEX, TROPONINI in the last 168 hours. BNP: BNP (last 3 results) No results for input(s): PROBNP in the last 8760 hours. CBG: No results for input(s): GLUCAP in the last 168 hours.   Additional labs: 1. Pathology results of right inguinal node biopsy: Diagnosis Lymph node, biopsy, Rt inguinal, needle core biopsy - METASTATIC NON-SMALL CELL CARCINOMA CONSISTENT WITH ADENOCARCINOMA. - SEE COMMENT. Microscopic Comment The malignant cells are positive for TTF-1, cytokeratin 7, and focally positive for Napsin-A. They are negative for cytokeratin 5/6, and p63. This profile is consistent with metastatic adenocarcinoma of lung primary. Dr. Claudette Laws has reviewed the case and concurs with this interpretation. Additional studies can be performed upon clinician request. (JBK:kh/gt 08-10-14)  Discussed in detail with patient's sister Ms. [name], updated care and answered questions.  Signed:  Vernell Leep, MD, FACP, FHM. [facility] Pager [phone]  If 7PM-7AM, please contact night-coverage [email or web address] Password [email or web address] 08/11/2014, 2:30 PM

## 2014-08-11 NOTE — Progress Notes (Signed)
I spoke with Dr. Jake Bathe today.  He has referred Mr. Brendan Hooper.  Patient is set for thoracic clinic on 08/25/14, he will instruct patient on time and place of appt.

## 2014-08-11 NOTE — Discharge Instructions (Signed)
Lung Cancer Lung cancer is an abnormal growth of cells in one or both of your lungs. These extra cells may form a mass of tissue called a growth or tumor. Tumors can be either cancerous (malignant) or not cancerous (benign).  Lung cancer is the most common cause of cancer death in men and women. There are several different types of lung cancers. Usually, lung cancer is described as either small cell lung cancer or nonsmall cell lung cancer. Other types of cancer occur in the lungs, including carcinoid and cancers spread from other organs. The types of cancer have different behavior and treatment. RISK FACTORS Smoking is the most common risk factor for developing lung cancer. Other risk factors include:  Radon gas exposure.  Asbestos and other industrial substance exposure.  Second hand tobacco smoke.  Air pollution.  Family or personal history of lung cancer.  Age older than 52 years. CAUSES  Lung cancer usually starts when the lungs are exposed to harmful chemicals. Smoking is the most common risk factor for lung cancer. When you quit smoking, your risk of lung cancer falls each year (but is never the same as a person who has never smoked).  SYMPTOMS  Lung cancer may not have any symptoms in its early stages. The symptoms can depend on the type of cancer, its location, and other factors. Symptoms can include:  Cough (either new, different, or more severe).  Shortness of breath.  Coughing up blood (hemoptysis).  Chest pain.  Hoarseness.  Swelling of the face.  Drooping eyelid.  Changes in blood tests, such as low sodium (hyponatremia), high calcium (hypercalcemia), or low blood count (anemia).  Weight loss. DIAGNOSIS  Your health care provider may suspect lung cancer based on your symptoms or based on tests obtained for other reasons. Tests or procedures used to find or confirm the presence of lung cancer may include:  Chest X-ray.  CT scan of the lungs and chest.  Blood  tests.  Taking a tissue sample (biopsy) from your lung to look for cancer cells. Your cancer will be staged to determine its severity and extent. Staging is a careful attempt to find out the size of the tumor, whether the cancer has spread, and if so, to what parts of the body. You may need to have more tests to determine the stage of your cancer. The test results will help determine what treatment plan is best for you.   Stage 0--This is the earliest stage of lung cancer. In this stage the tumor is present in only a few layers of cells and has not grown beyond the inner lining of the lungs. Stage 0 (carcinoma in situ) is considered noninvasive, meaning at this stage it is not yet capable of spreading to other regions.  Stage I-- The cancer is located only in the lungs and not spread to any lymph nodes.  Stage II--The cancer is in the lungs and the nearby lymph nodes.  Stage III--The cancer is in the lungs and the lymph nodes in the middle of the chest. This is also called locally advanced disease. This stage has two subtypes:  Stage IIIa - The cancer has spread only to lymph nodes on the same side of the chest where the cancer started.  Stage IIIb - The cancer has spread to lymph nodes on the opposite side of the chest or above the collar bone.  Stage IV-- This is the most advanced stage of lung cancer and is also called advanced disease. This  stage describes when the cancer has spread to both lungs, the fluid in the area around the lungs, or to another body part. Your health care provider may tell you the detailed stage of your cancer, which includes both a number and a letter.  TREATMENT  Depending on the type and stage, lung cancer may be treated with surgery, radiation therapy, chemotherapy, or targeted therapy. Some people have a combination of these therapies. Your treatment plan will be developed by your health care team.  [place] not smoke.  Only take  over-the-counter or prescription medicines for pain, discomfort, or fever as directed by your health care provider.  Maintain a healthy diet.  Consider joining a support group. This may help you learn to cope with the stress of having lung cancer.  Seek advice to help you manage treatment side effects.  Keep all follow-up appointments as directed by your health care provider.  Inform your cancer specialist if you are admitted to the hospital. [place] IF:   You are losing weight without trying.  You have a persistent cough.  You feel short of breath.  You tire easily. SEEK IMMEDIATE MEDICAL CARE IF:   You cough up clotted blood or bright red blood.  Your pain is not manageable or controlled by medicine.  You develop new difficulty breathing or chest pain.  You develop swelling in one or both ankles or legs, or swelling in your face or neck.  You develop headache or confusion. Document Released: 11/11/2000 Document Revised: 05/26/2013 Document Reviewed: 12/09/2013 [facility] Patient Information 2015 [place], [place]. This information is not intended to replace advice given to you by your health care provider. Make sure you discuss any questions you have with your health care provider.

## 2014-08-12 LAB — TISSUE CULTURE
Culture: NO GROWTH
Gram Stain: NONE SEEN

## 2014-08-13 LAB — CULTURE, BLOOD (ROUTINE X 2)
Culture: NO GROWTH
Culture: NO GROWTH

## 2014-08-22 ENCOUNTER — Inpatient Hospital Stay: Payer: Medicare Other | Admitting: Internal Medicine

## 2014-08-25 ENCOUNTER — Other Ambulatory Visit: Payer: Self-pay | Admitting: *Deleted

## 2014-08-25 ENCOUNTER — Encounter: Payer: Self-pay | Admitting: *Deleted

## 2014-08-25 ENCOUNTER — Encounter: Payer: Self-pay | Admitting: Internal Medicine

## 2014-08-25 ENCOUNTER — Telehealth: Payer: Self-pay | Admitting: Internal Medicine

## 2014-08-25 ENCOUNTER — Ambulatory Visit: Payer: Medicare Other | Admitting: Radiation Oncology

## 2014-08-25 ENCOUNTER — Ambulatory Visit: Payer: Medicare Other | Attending: Internal Medicine | Admitting: Physical Therapy

## 2014-08-25 ENCOUNTER — Ambulatory Visit (HOSPITAL_BASED_OUTPATIENT_CLINIC_OR_DEPARTMENT_OTHER): Payer: Medicare Other | Admitting: Internal Medicine

## 2014-08-25 ENCOUNTER — Other Ambulatory Visit (HOSPITAL_BASED_OUTPATIENT_CLINIC_OR_DEPARTMENT_OTHER): Payer: Medicare Other

## 2014-08-25 ENCOUNTER — Other Ambulatory Visit (HOSPITAL_COMMUNITY)
Admission: RE | Admit: 2014-08-25 | Discharge: 2014-08-25 | Disposition: A | Discharge status: Home / Self Care | Payer: Medicare Other | Source: Ambulatory Visit | Attending: Internal Medicine | Admitting: Internal Medicine

## 2014-08-25 ENCOUNTER — Ambulatory Visit (INDEPENDENT_AMBULATORY_CARE_PROVIDER_SITE_OTHER): Payer: Medicare Other | Admitting: Thoracic Surgery (Cardiothoracic Vascular Surgery)

## 2014-08-25 VITALS — BP 90/56 | HR 86 | Temp 97.4°F | Resp 18 | Ht 72.0 in | Wt 146.1 lb

## 2014-08-25 DIAGNOSIS — R51 Headache: Secondary | ICD-10-CM | POA: Insufficient documentation

## 2014-08-25 DIAGNOSIS — C3491 Malignant neoplasm of unspecified part of right bronchus or lung: Secondary | ICD-10-CM

## 2014-08-25 DIAGNOSIS — C3490 Malignant neoplasm of unspecified part of unspecified bronchus or lung: Secondary | ICD-10-CM

## 2014-08-25 DIAGNOSIS — I10 Essential (primary) hypertension: Secondary | ICD-10-CM

## 2014-08-25 DIAGNOSIS — C774 Secondary and unspecified malignant neoplasm of inguinal and lower limb lymph nodes: Secondary | ICD-10-CM | POA: Insufficient documentation

## 2014-08-25 DIAGNOSIS — R918 Other nonspecific abnormal finding of lung field: Secondary | ICD-10-CM

## 2014-08-25 DIAGNOSIS — C799 Secondary malignant neoplasm of unspecified site: Secondary | ICD-10-CM | POA: Diagnosis not present

## 2014-08-25 DIAGNOSIS — D571 Sickle-cell disease without crisis: Secondary | ICD-10-CM | POA: Insufficient documentation

## 2014-08-25 DIAGNOSIS — D573 Sickle-cell trait: Secondary | ICD-10-CM

## 2014-08-25 DIAGNOSIS — Z803 Family history of malignant neoplasm of breast: Secondary | ICD-10-CM

## 2014-08-25 DIAGNOSIS — Z720 Tobacco use: Secondary | ICD-10-CM

## 2014-08-25 HISTORY — DX: Malignant neoplasm of unspecified part of right bronchus or lung: C34.91

## 2014-08-25 LAB — COMPREHENSIVE METABOLIC PANEL (CC13)
ALBUMIN: 3.1 g/dL — AB (ref 3.5–5.0)
ALT: 8 U/L (ref 0–55)
AST: 25 U/L (ref 5–34)
Alkaline Phosphatase: 82 U/L (ref 40–150)
Anion Gap: 12 mEq/L — ABNORMAL HIGH (ref 3–11)
BILIRUBIN TOTAL: 0.46 mg/dL (ref 0.20–1.20)
BUN: 7.6 mg/dL (ref 7.0–26.0)
CO2: 28 meq/L (ref 22–29)
Calcium: 10.5 mg/dL — ABNORMAL HIGH (ref 8.4–10.4)
Chloride: 101 mEq/L (ref 98–109)
Creatinine: 0.8 mg/dL (ref 0.7–1.3)
EGFR: >90 mL/min/{1.73_m2} (ref >90)
Glucose: 107 mg/dl (ref 70–140)
Potassium: 4.1 mEq/L (ref 3.5–5.1)
SODIUM: 140 meq/L (ref 136–145)
TOTAL PROTEIN: 8.3 g/dL (ref 6.4–8.3)

## 2014-08-25 LAB — CBC WITH DIFFERENTIAL/PLATELET
BASO%: 0.6 % (ref 0.0–2.0)
Basophils Absolute: 0 10*3/uL (ref 0.0–0.1)
EOS%: 2.5 % (ref 0.0–7.0)
Eosinophils Absolute: 0.2 10*3/uL (ref 0.0–0.5)
HCT: 45.9 % (ref 38.4–49.9)
HGB: 15.1 g/dL (ref 13.0–17.1)
LYMPH%: 28.5 % (ref 14.0–49.0)
MCH: 26.3 pg — ABNORMAL LOW (ref 27.2–33.4)
MCHC: 32.9 g/dL (ref 32.0–36.0)
MCV: 80.1 fL (ref 79.3–98.0)
MONO#: 0.6 10*3/uL (ref 0.1–0.9)
MONO%: 8.2 % (ref 0.0–14.0)
NEUT%: 60.2 % (ref 39.0–75.0)
NEUTROS ABS: 4.3 10*3/uL (ref 1.5–6.5)
PLATELETS: 588 10*3/uL — AB (ref 140–400)
RBC: 5.74 10*6/uL (ref 4.20–5.82)
RDW: 17.1 % — ABNORMAL HIGH (ref 11.0–14.6)
WBC: 7.2 10*3/uL (ref 4.0–10.3)
lymph#: 2.1 10*3/uL (ref 0.9–3.3)

## 2014-08-25 MED ORDER — CYANOCOBALAMIN 1000 MCG/ML IJ SOLN
INTRAMUSCULAR | Status: AC
  Filled 2014-08-25: qty 1

## 2014-08-25 MED ORDER — DEXAMETHASONE 4 MG PO TABS: ORAL_TABLET | ORAL | Status: DC

## 2014-08-25 MED ORDER — PROCHLORPERAZINE MALEATE 10 MG PO TABS: 10.0000 mg | ORAL_TABLET | Freq: Four times a day (QID) | ORAL | Status: DC | PRN

## 2014-08-25 MED ORDER — CYANOCOBALAMIN 1000 MCG/ML IJ SOLN
1000.0000 ug | Freq: Once | INTRAMUSCULAR | Status: AC
  Administered 2014-08-25: 1000 ug via INTRAMUSCULAR

## 2014-08-25 NOTE — Progress Notes (Signed)
[facility] Clinical Social Work  Clinical Social Work met with patient and medical oncologist at [facility] appointment to offer support and assess for psychosocial needs.  Medical oncologist reviewed patient's diagnosis and recommended treatment plan with patient.  Mr. Perfecto reported his main support is his sister, [name] Speaks. The patient shared he usually rides the bus and is sometimes transported by his sister.  Mr. Stanis reported his main concern is that the treatment works.  CSW encouraged patient to meet with financial counseling regarding [facility] grant and to bring his sister to his chemotherapy education class.    Clinical Social Work briefly discussed Clinical Social Work role and [facility] support programs/services.  Clinical Social Work encouraged patient to call with any additional questions or concerns.   Lauren Mullis, MSW, LCSW, OSW-C Clinical Social Worker [facility] [phone]  

## 2014-08-25 NOTE — Progress Notes (Signed)
[place] [phone]   [phone] Multidisciplinary thoracic oncology clinic  CONSULT NOTE  REFERRING PHYSICIAN: Dr. Vernell Leep  REASON FOR CONSULTATION:  69 years old African-American male with metastatic lung cancer  HPI Rana Adorno is a 69 y.o. male with a past medical history significant for hypertension, GERD as well as sickle cell trait. The patient mentions that in February 2013 he was found on chest x-ray and CT scan of the chest to have a pulmonary nodule measuring 1.5 x 1.6 cm in the right upper lobe. He was advised to follow with his primary care physician for further evaluation but the patient ignored it for the last 3 years. He presented to the emergency department at [facility] on 08/06/2014 complaining of cough fever and chills. He also had pain and swelling on the right thigh area. Repeat chest x-ray on 08/06/2014 showed masslike consolidation and expansion of the right middle lobe. Given the prior pulmonary nodule within the right upper lobe at the right minor fissure in 2013, this is suspicious for a large 9.7 cm mass and adjacent postobstructive pneumonia. CT of the right hip was performed on 08/06/2014 and it showed a large 4.6 x 3.8 x 4.0 cm peripherally enhancing collection of fluid at the right inguinal region. This may reflect abscess arising from a centrally necrotic node. This may reflect underlying infection extending up the right lower extremity, or focal right inguinal infection. Overlying soft tissue inflammation is seen, with associated skin thickening extending along the proximal anterior right thigh. Mildly asymmetric enhancement of the smaller right inguinal nodes. No evidence of vascular compromise. Abscess abuts the adjacent quadriceps musculature, with soft tissue inflammation along the fascia of the anterior right thigh. On 08/08/2014 the patient underwent ultrasound-guided core biopsy of the right inguinal  nodal mass by interventional radiology.  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 The malignant cells are positive for TTF-1, cytokeratin 7, and focally positive for Napsin-A. They are negative for cytokeratin 5/6, and p63. This profile is consistent with metastatic adenocarcinoma of lung primary.  The patient was referred to me today for further evaluation and recommendation regarding treatment of his condition. When seen today he denied having any significant chest pain, shortness of breath but continues to have cough with no hemoptysis. He lost around 25 pounds over the last 3 months. The patient has occasional headache but no visual changes. He denied having any significant nausea, vomiting or change in his bowel movement. Family history significant for a mother who died in her 12s with breast cancer. The patient is single and has 3 children, 2 daughters and one son. He used to work in [employer], medical. He has a history of smoking half pack per day for around 55 years and unfortunately he continues to smoke but trying to quit. He also drinks around 40 ounce of beer every day. No history of drug abuse. His caregiver is his sister Vaughan Basta speaks.  HPI  Past Medical History  Diagnosis Date  . Hypertension   . GERD (gastroesophageal reflux disease)   . Headache(784.0)   . Sickle cell anemia   . Bronchogenic cancer of right lung 08/25/2014    Past Surgical History  Procedure Laterality Date  . No past surgeries      No family history on file.  Social History History  Substance Use Topics  . Smoking status: Current Every Day Smoker -- 0.50 packs/day for 40 years  Types: Cigarettes  . Smokeless tobacco: Never Used  . Alcohol Use: Yes     Comment: a pint of liquor a day    No Known Allergies  Current Outpatient Prescriptions  Medication Sig Dispense Refill  . acetaminophen (TYLENOL) 325 MG tablet Take 2  tablets (650 mg total) by mouth every 6 (six) hours as needed for mild pain, moderate pain or fever.    . folic acid (FOLVITE) 1 MG tablet Take 1 tablet (1 mg total) by mouth daily. 30 tablet 0  . Multiple Vitamin (MULTIVITAMIN WITH MINERALS) TABS tablet Take 1 tablet by mouth daily. 30 tablet 0  . nicotine (NICODERM CQ - DOSED IN MG/24 HOURS) 21 mg/24hr patch Place 1 patch (21 mg total) onto the skin daily. 28 patch 0  . thiamine 100 MG tablet Take 1 tablet (100 mg total) by mouth daily. 30 tablet 0   Current Facility-Administered Medications  Medication Dose Route Frequency Provider Last Rate Last Dose  . cyanocobalamin ((VITAMIN B-12)) injection 1,000 mcg  1,000 mcg Intramuscular Once Curt Bears, MD        Review of Systems  Constitutional: positive for anorexia, fatigue and weight loss Eyes: negative Ears, nose, mouth, throat, and face: negative Respiratory: positive for cough Cardiovascular: negative Gastrointestinal: negative Genitourinary:negative Integument/breast: negative Hematologic/lymphatic: negative Musculoskeletal:positive for bone pain Neurological: negative Behavioral/Psych: negative Endocrine: negative Allergic/Immunologic: negative  Physical Exam  JAS:NKNLZ, healthy, no distress, well nourished and well developed SKIN: skin color, texture, turgor are normal, no rashes or significant lesions HEAD: Normocephalic, No masses, lesions, tenderness or abnormalities EYES: normal, PERRLA, Conjunctiva are pink and non-injected EARS: External ears normal, Canals clear OROPHARYNX:no exudate, no erythema and lips, buccal mucosa, and tongue normal  NECK: supple, no adenopathy, no JVD LYMPH:  no palpable lymphadenopathy, no hepatosplenomegaly LUNGS: clear to auscultation , and palpation HEART: regular rate & rhythm and no murmurs ABDOMEN:abdomen soft, non-tender, normal bowel sounds and no masses or organomegaly BACK: Back symmetric, no curvature., No CVA  tenderness EXTREMITIES:no joint deformities, effusion, or inflammation, no edema, no skin discoloration  NEURO: alert & oriented x 3 with fluent speech, no focal motor/sensory deficits  PERFORMANCE STATUS: ECOG 1  LABORATORY DATA: Lab Results  Component Value Date   WBC 7.2 08/25/2014   HGB 15.1 08/25/2014   HCT 45.9 08/25/2014   MCV 80.1 08/25/2014   PLT 588* 08/25/2014      Chemistry      Component Value Date/Time   NA 140 08/25/2014 1344   NA 135 08/11/2014 0555   K 4.1 08/25/2014 1344   K 4.0 08/11/2014 0555   CL 101 08/11/2014 0555   CO2 28 08/25/2014 1344   CO2 26 08/11/2014 0555   BUN 7.6 08/25/2014 1344   BUN <5* 08/11/2014 0555   CREATININE 0.8 08/25/2014 1344   CREATININE 0.59 08/11/2014 0555      Component Value Date/Time   CALCIUM 10.5* 08/25/2014 1344   CALCIUM 9.1 08/11/2014 0555   ALKPHOS 82 08/25/2014 1344   ALKPHOS 80 08/07/2014 0420   AST 25 08/25/2014 1344   AST 23 08/07/2014 0420   ALT 8 08/25/2014 1344   ALT 9 08/07/2014 0420   BILITOT 0.46 08/25/2014 1344   BILITOT 1.0 08/07/2014 0420       RADIOGRAPHIC STUDIES: Dg Chest 2 View  08/06/2014   CLINICAL DATA:  Acute onset of productive cough for 2 weeks. Current history of smoking. Initial encounter.  EXAM: CHEST  2 VIEW  COMPARISON:  Chest radiograph and  CT of the chest performed 10/11/2011  FINDINGS: There is masslike consolidation and expansion of the right middle lobe. Given the prior pulmonary nodule within the right upper lobe at the right minor fissure in 2013, this is suspicious for a large 9.7 cm mass and adjacent postobstructive pneumonia.  The left lung appears relatively clear, aside from mildly increased interstitial markings, likely chronic in nature. No pleural effusion or pneumothorax is seen.  The heart remains normal in size. No acute osseous abnormalities are identified.  IMPRESSION: Masslike consolidation and expansion at the right middle lobe. Given the prior pulmonary nodule  within the right upper lobe at the right minor fissure in 2013, this is suspicious for a large 9.7 cm mass and adjacent postobstructive pneumonia. CT of the chest would be helpful for further evaluation, when and as deemed clinically appropriate.   Electronically Signed   By: Garald Balding M.D.   On: 08/06/2014 21:27   Ct Hip Right W Contrast  08/06/2014   CLINICAL DATA:  Significant right inguinal swelling over the past few days. Initial encounter.  EXAM: CT OF THE RIGHT HIP WITH CONTRAST  TECHNIQUE: Multidetector CT imaging was performed following the standard protocol during bolus administration of intravenous contrast.  CONTRAST:  130m OMNIPAQUE IOHEXOL 300 MG/ML  SOLN  COMPARISON:  None.  FINDINGS: There is a large 4.6 x 3.8 x 4.0 cm peripherally enhancing collection of fluid at the right inguinal region. This may reflect abscess arising from a centrally necrotic node. This may reflect underlying infection extending up the right lower extremity, or focal right inguinal infection. Overlying soft tissue inflammation is seen, with associated skin thickening extending along the proximal anterior right thigh.  There is mildly asymmetric enhancement of additional smaller right inguinal nodes. There is no evidence of vascular compromise. The underlying vasculature is unremarkable in appearance.  The abscess abuts the adjacent quadriceps musculature. There is soft tissue inflammation along the fascia of the anterior right thigh.  No acute osseous abnormalities are seen.  Visualized small large bowel loops are grossly unremarkable. The bladder is mildly distended and unremarkable in appearance. A small amount of free fluid within the pelvis is of uncertain significance. The prostate remains normal in size.  IMPRESSION: 1. Large 4.6 x 3.8 x 4.0 cm peripherally enhancing collection of fluid at the right inguinal region. This may reflect an abscess arising from a centrally necrotic node, due to underlying infection  extending up the right lower extremity or focal right inguinal infection. Overlying soft tissue inflammation, with associated skin thickening extending along the proximal anterior right thigh. 2. Mildly asymmetric enhancement of the smaller right inguinal nodes. No evidence of vascular compromise. 3. Abscess abuts the adjacent quadriceps musculature, with soft tissue inflammation along the fascia of the anterior right thigh. 4. Small amount of free fluid within the pelvis, of uncertain significance.   Electronically Signed   By: JGarald BaldingM.D.   On: 08/06/2014 23:00   [place]  08/08/2014   CLINICAL DATA:  Palpable right inguinal nodal mass  EXAM: ULTRASOUND GUIDED CORE BIOPSY OF RIGHT INGUINAL NODAL MASS  MEDICATIONS: 1 mg IV Versed; 50 mcg IV Fentanyl  Total Moderate Sedation Time: 15  PROCEDURE: The procedure, risks, benefits, and alternatives were explained to the patient. Questions regarding the procedure were encouraged and answered. The patient understands and consents to the procedure.  The right inguinal area was prepped with Betadine in a sterile fashion, and a sterile drape was applied covering the operative field.  A sterile gown and sterile gloves were used for the procedure. Local anesthesia was provided with 1% Lidocaine.  Previous imaging reviewed. Preliminary ultrasound performed. The right inguinal nodal mass was localized. Under sterile conditions and local anesthesia, an 18 gauge core needle biopsy device was advanced to the right inguinal abnormality. Several core biopsies obtained. Samples placed in saline. No immediate complication. Patient tolerated the biopsy well. No hemorrhage or hematoma following the biopsy.  COMPLICATIONS: None.  FINDINGS: Imaging confirms needle placed in the right inguinal nodal mass for core biopsy  IMPRESSION: Successful right inguinal nodal mass ultrasound core biopsy   Electronically Signed   By: Daryll Brod M.D.   On: 08/08/2014 17:07    ASSESSMENT:  This is a very pleasant 69 years old African-American male recently diagnosed with stage IV (T3, N2, M1 B) non-small cell lung cancer, adenocarcinoma presented with large right lung mass in addition to mediastinal lymphadenopathy and metastatic lesion in the right thigh diagnosed in December 2015.  PLAN: I had a lengthy discussion with the patient today about his current disease is stage, prognosis and treatment options. The patient will need full staging workup for evaluation of his disease. I will arrange for the patient to have a PET scan as well as MRI of the brain to rule out any other metastatic lesions. I explained to the patient that he has incurable condition in order the treatment will be off palliative nature. I also ask the pathology department to send his tissue to be tested for EGFR mutation as well as ALK gene translocation. I discussed with the patient his treatment options including palliative care and hospice referral versus consideration of palliative systemic chemotherapy with carboplatin for AUC of 5 and Alimta 500 MG/M2 The patient is interested in proceeding with the systemic chemotherapy. I discussed with him the adverse effect of this treatment including but not limited to alopecia, myelosuppression, nausea and vomiting, peripheral neuropathy, liver or renal dysfunction. I will call his pharmacy with prescription for Compazine 10 mg by mouth every 6 hours as needed for nausea, folic acid 1 mg by mouth daily in addition to Decadron 4 mg by mouth twice a day the day before, day of and day after the chemotherapy. I will arrange for the patient to have a chemotherapy education class before the first cycle of his chemotherapy The patient voices understanding of current disease status and treatment options and is in agreement with the current care plan. He is expected to start the first cycle of his systemic chemotherapy on 09/05/2014. He would come back for follow-up visit in 3 weeks  for reevaluation and management of any adverse effect of his treatment. The patient was seen by Dr. Roxan Hockey today for consideration of Port-A-Cath placement. He was seen at the multidisciplinary thoracic oncology clinic today by medical oncology, thoracic surgery, thoracic navigator, social worker, physical therapist as well as oncology pharmacist. The patient was advised to call immediately if he has any concerning symptoms in the interval. All questions were answered. The patient knows to call the clinic with any problems, questions or concerns. We can certainly see the patient much sooner if necessary.  Thank you so much for allowing me to participate in the care of [place]. I will continue to follow up the patient with you and assist in his care.  I spent 55 minutes counseling the patient face to face. The total time spent in the appointment was 80 minutes.  Disclaimer: This note was dictated with voice  recognition software. Similar sounding words can inadvertently be transcribed and may not be corrected upon review.   Ryann Leavitt K. 08/25/2014, 2:45 PM

## 2014-08-25 NOTE — Patient Instructions (Signed)
Smoking Cessation, Tips for Success  If you are ready to quit smoking, congratulations! You have chosen to help yourself be healthier. Cigarettes bring nicotine, tar, carbon monoxide, and other irritants into your body. Your lungs, heart, and blood vessels will be able to work better without these poisons. There are many different ways to quit smoking. Nicotine gum, nicotine patches, a nicotine inhaler, or nicotine nasal spray can help with physical craving. Hypnosis, support groups, and medicines help break the habit of smoking.  WHAT THINGS CAN I DO TO MAKE QUITTING EASIER?   Here are some tips to help you quit for good:  · Pick a date when you will quit smoking completely. Tell all of your friends and family about your plan to quit on that date.  · Do not try to slowly cut down on the number of cigarettes you are smoking. Pick a quit date and quit smoking completely starting on that day.  · Throw away all cigarettes.    · Clean and remove all ashtrays from your home, work, and car.  · On a card, write down your reasons for quitting. Carry the card with you and read it when you get the urge to smoke.  · Cleanse your body of nicotine. Drink enough water and fluids to keep your urine clear or pale yellow. Do this after quitting to flush the nicotine from your body.  · Learn to predict your moods. Do not let a bad situation be your excuse to have a cigarette. Some situations in your life might tempt you into wanting a cigarette.  · Never have "just one" cigarette. It leads to wanting another and another. Remind yourself of your decision to quit.  · Change habits associated with smoking. If you smoked while driving or when feeling stressed, try other activities to replace smoking. Stand up when drinking your coffee. Brush your teeth after eating. Sit in a different chair when you read the paper. Avoid alcohol while trying to quit, and try to drink fewer caffeinated beverages. Alcohol and caffeine may urge you to  smoke.  · Avoid foods and drinks that can trigger a desire to smoke, such as sugary or spicy foods and alcohol.  · Ask people who smoke not to smoke around you.  · Have something planned to do right after eating or having a cup of coffee. For example, plan to take a walk or exercise.  · Try a relaxation exercise to calm you down and decrease your stress. Remember, you may be tense and nervous for the first 2 weeks after you quit, but this will pass.  · Find new activities to keep your hands busy. Play with a pen, coin, or rubber band. Doodle or draw things on paper.  · Brush your teeth right after eating. This will help cut down on the craving for the taste of tobacco after meals. You can also try mouthwash.    · Use oral substitutes in place of cigarettes. Try using lemon drops, carrots, cinnamon sticks, or chewing gum. Keep them handy so they are available when you have the urge to smoke.  · When you have the urge to smoke, try deep breathing.  · Designate your home as a nonsmoking area.  · If you are a heavy smoker, ask your health care provider about a prescription for nicotine chewing gum. It can ease your withdrawal from nicotine.  · Reward yourself. Set aside the cigarette money you save and buy yourself something nice.  · Look for   support from others. Join a support group or smoking cessation program. Ask someone at home or at work to help you with your plan to quit smoking.  · Always ask yourself, "Do I need this cigarette or is this just a reflex?" Tell yourself, "Today, I choose not to smoke," or "I do not want to smoke." You are reminding yourself of your decision to quit.  · Do not replace cigarette smoking with electronic cigarettes (commonly called e-cigarettes). The safety of e-cigarettes is unknown, and some may contain harmful chemicals.  · If you relapse, do not give up! Plan ahead and think about what you will do the next time you get the urge to smoke.  HOW WILL I FEEL WHEN I QUIT SMOKING?  You  may have symptoms of withdrawal because your body is used to nicotine (the addictive substance in cigarettes). You may crave cigarettes, be irritable, feel very hungry, cough often, get headaches, or have difficulty concentrating. The withdrawal symptoms are only temporary. They are strongest when you first quit but will go away within 10-14 days. When withdrawal symptoms occur, stay in control. Think about your reasons for quitting. Remind yourself that these are signs that your body is healing and getting used to being without cigarettes. Remember that withdrawal symptoms are easier to treat than the major diseases that smoking can cause.   Even after the withdrawal is over, expect periodic urges to smoke. However, these cravings are generally short lived and will go away whether you smoke or not. Do not smoke!  WHAT RESOURCES ARE AVAILABLE TO HELP ME QUIT SMOKING?  Your health care provider can direct you to community resources or hospitals for support, which may include:  · Group support.  · Education.  · Hypnosis.  · Therapy.  Document Released: 05/03/2004 Document Revised: 12/20/2013 Document Reviewed: 01/21/2013  [employer]® Patient Information ©2015 [employer]. This information is not intended to replace advice given to you by your health care provider. Make sure you discuss any questions you have with your health care provider.

## 2014-08-25 NOTE — Therapy (Signed)
[place], [place], [address] Phone: [phone]   Fax:  [phone]  Physical Therapy Evaluation  Patient Details  Name: Brendan Hooper MRN: [id number] Date of Birth: March 27, 1946 Referring Provider:  Curt Bears, MD  Encounter Date: 08/25/2014      PT End of Session - 08/25/14 1514    Visit Number 1   Number of Visits 1   Date for PT Re-Evaluation 10/23/14   PT Start Time 9381   PT Stop Time 1505   PT Time Calculation (min) 20 min   Behavior During Therapy [facility] for tasks assessed/performed;Anxious      Past Medical History  Diagnosis Date  . Hypertension   . GERD (gastroesophageal reflux disease)   . Headache(784.0)   . Sickle cell anemia   . Bronchogenic cancer of right lung 08/25/2014    Past Surgical History  Procedure Laterality Date  . No past surgeries      There were no vitals taken for this visit.  Visit Diagnosis:  Lung cancer, primary, with metastasis from lung to other site, unspecified laterality - Plan: PT plan of care cert/re-cert      Subjective Assessment - 08/25/14 1502    Symptoms Pt. presented to ED with cough for 2 weeks.   Pertinent History In 2013 patient was evaluated for lung nodule but declined further evaluation.  Chest x-ray 08/06/14 followed by ultrasound biopsy 08/08/14 revealed 4 cm. 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  Pt. has been advised to have chemotherapy.  h/o smoking, ETOH abuse, sickle cell anemia, HTN, GERD.   Currently in Pain? No/denies          [facility] PT Assessment - 08/25/14 0001    Assessment   Medical Diagnosis non-small cell cancer consistent with adenocarcinoma, lung primary with  metastases to right groin   Precautions   Precautions Other (comment)   Precaution Comments metastatic cancer precautions   Restrictions   Weight Bearing Restrictions No   Balance Screen   Has the patient  fallen in the past 6 months No   Has the patient had a decrease in activity level because of a fear of falling?  No   Is the patient reluctant to leave their home because of a fear of falling?  No   Home Environment   Living Enviornment Other (Comment)   Additional Comments Lives at [facility]; no stairs   Prior Function   Level of Independence Independent with basic ADLs;Independent with gait   Observation/Other Assessments   Observations Tall, thin African-American male   Sensation   Light Touch Not tested  denies numbness   Posture/Postural Control   Posture/Postural Control Postural limitations   Postural Limitations Forward head   AROM   Overall AROM  Within functional limits for tasks performed   Overall AROM Comments Checked trunk AROM in standing   Strength   Overall Strength Other (comment)  not assessed; pt. denies weakness   Ambulation/Gait   Ambulation/Gait Yes   Ambulation/Gait Assistance 7: Independent   Ambulation Distance (Feet) --  reports he walks a mile to/from store several times a week   Balance   Balance Assessed Yes   Dynamic Standing Balance   Dynamic Standing - Comments reached 10 inches forward in standing           LYMPHEDEMA/ONCOLOGY QUESTIONNAIRE - 08/25/14 1514    Type   Cancer Type primary lung adenocarcinoma with metastases to groin lymph nodes  PT Education - 23-Sep-2014 1514    Education provided Yes   Education Details posture, breathing, walking, energy conservation   Person(s) Educated Patient   Methods Explanation;Handout   Comprehension Verbalized understanding               Lung Clinic Goals - September 23, 2014 1517    Patient will be able to verbalize understanding of the benefit of exercise to decrease fatigue.   Status Achieved   Patient will be able to verbalize the importance of posture.   Status Achieved   Patient will be able to demonstrate diaphragmatic breathing for improved  lung function.   Status Achieved   Patient will be able to verbalize understanding of the role of physical therapy to prevent functional decline and who to contact if physical therapy is needed.   Status Achieved             Plan - September 23, 2014 1515    Clinical Impression Statement Pt. with no further therapy needs at this time.   Rehab Potential Good   PT Frequency One time visit   PT Treatment/Interventions Patient/family education   PT Next Visit Plan Follow-up as needed.   Consulted and Agree with Plan of Care Patient          G-Codes - 2014/09/23 1517    Functional Assessment Tool Used clinical judgement   Functional Limitation Other PT primary   Other PT Primary Current Status (H7342) At least 1 percent but less than 20 percent impaired, limited or restricted   Other PT Primary Goal Status (A7681) At least 1 percent but less than 20 percent impaired, limited or restricted   Other PT Primary Discharge Status (L5726) At least 1 percent but less than 20 percent impaired, limited or restricted       Problem List Patient Active Problem List   Diagnosis Date Noted  . Bronchogenic cancer of right lung Sep 23, 2014  . Hypokalemia   . Protein-calorie malnutrition, severe 08/08/2014  . Enlargement of lymph node   . CAP (community acquired pneumonia) 08/07/2014  . Lung mass 08/07/2014  . Abscess of right groin 08/07/2014  . Postobstructive pneumonia 08/06/2014  . Syncope 10/11/2011  . Alcohol abuse 10/11/2011  . HTN (hypertension) 10/11/2011  . Current smoker 10/11/2011    Kiandre Spagnolo 2014/09/23, 3:20 PM  [place], [place], [address] Phone: [phone]   Fax:  [phone]  Serafina Royals, [place]

## 2014-08-25 NOTE — Telephone Encounter (Signed)
Gave avs & cal for Jan & Feb. Sent mess to sch tx.

## 2014-08-25 NOTE — Progress Notes (Signed)
PCP is Antonietta Jewel, MD Referring Provider is Antonietta Jewel, MD  No chief complaint on file.   HPI:69 yo man with a history of tobacco abuse who recently was hospitalized with a post- obstructive pneumonia of the right lung. He has a large right lung mass. He also had right inguinal adenopathy. Needle biopsy of the right inguinal node was positive for metastatic non-small cell lung cancer. He needs IV access for chemo.    Past Medical History  Diagnosis Date  . Hypertension   . GERD (gastroesophageal reflux disease)   . Headache(784.0)   . Sickle cell anemia   . Bronchogenic cancer of right lung 08/25/2014    Past Surgical History  Procedure Laterality Date  . No past surgeries      No family history on file.  Social History History  Substance Use Topics  . Smoking status: Current Every Day Smoker -- 0.50 packs/day for 40 years    Types: Cigarettes  . Smokeless tobacco: Never Used  . Alcohol Use: Yes     Comment: a pint of liquor a day    Current Outpatient Prescriptions  Medication Sig Dispense Refill  . acetaminophen (TYLENOL) 325 MG tablet Take 2 tablets (650 mg total) by mouth every 6 (six) hours as needed for mild pain, moderate pain or fever.    . folic acid (FOLVITE) 1 MG tablet Take 1 tablet (1 mg total) by mouth daily. 30 tablet 0  . Multiple Vitamin (MULTIVITAMIN WITH MINERALS) TABS tablet Take 1 tablet by mouth daily. 30 tablet 0  . nicotine (NICODERM CQ - DOSED IN MG/24 HOURS) 21 mg/24hr patch Place 1 patch (21 mg total) onto the skin daily. 28 patch 0  . thiamine 100 MG tablet Take 1 tablet (100 mg total) by mouth daily. 30 tablet 0   No current facility-administered medications for this visit.    No Known Allergies  Review of Systems  Constitutional: Positive for unexpected weight change.  Respiratory: Positive for cough, shortness of breath and wheezing.   Musculoskeletal:       Right hip pain  Hematological: Positive for adenopathy.    There were  no vitals taken for this visit. Physical Exam  Constitutional: He is oriented to person, place, and time. No distress.  thin  HENT:  Head: Normocephalic and atraumatic.  Eyes: EOM are normal. Pupils are equal, round, and reactive to light.  Cardiovascular: Normal rate, regular rhythm and normal heart sounds.   No murmur heard. Pulmonary/Chest:  Diminished BS right base  Abdominal: Soft. There is no tenderness.  Musculoskeletal: He exhibits no edema.  Lymphadenopathy:    He has no cervical adenopathy.  Neurological: He is alert and oriented to person, place, and time. No cranial nerve deficit.  Skin: Skin is warm and dry.  Vitals reviewed.    Diagnostic Tests: FINAL DIAGNOSIS Diagnosis Lymph node, biopsy, Rt inguinal, needle core biopsy - METASTATIC NON-SMALL CELL CARCINOMA CONSISTENT WITH ADENOCARCINOMA. - SEE COMMENT. Microscopic Comment The malignant cells are positive for TTF-1  Impression: 25 yop man with stage IV lung cancer needs IV access for chemotherapy. I recommended to Mr. Rodeheaver that we place a port-a-cath for access. I described the indications, risks, benefits, and alternatives with Mr. Platten. He understands the risks include but are not limited to bleeding, pneumothorax, infection, thrombosis, or other unforeseeable complications. He agrees to proceed. For OR Thursday 09/01/2014  Plan:

## 2014-08-26 ENCOUNTER — Telehealth: Payer: Self-pay | Admitting: *Deleted

## 2014-08-26 NOTE — Telephone Encounter (Signed)
Per staff message and POF I have scheduled appts. Advised scheduler of appts and to move lab appts  JMW  

## 2014-08-29 ENCOUNTER — Encounter: Payer: Self-pay | Admitting: *Deleted

## 2014-08-29 ENCOUNTER — Other Ambulatory Visit: Payer: Self-pay | Admitting: *Deleted

## 2014-08-29 DIAGNOSIS — C3491 Malignant neoplasm of unspecified part of right bronchus or lung: Secondary | ICD-10-CM

## 2014-08-29 NOTE — [facility] Oncology Navigator Note (Unsigned)
Care taker for Brendan Hooper called and left me a voice message to call.  I called.  She had questions about appt.  I clarified.

## 2014-08-31 ENCOUNTER — Other Ambulatory Visit: Payer: Medicare Other

## 2014-08-31 ENCOUNTER — Ambulatory Visit (HOSPITAL_BASED_OUTPATIENT_CLINIC_OR_DEPARTMENT_OTHER): Payer: Medicare Other | Admitting: Internal Medicine

## 2014-08-31 ENCOUNTER — Encounter: Payer: Self-pay | Admitting: Internal Medicine

## 2014-08-31 ENCOUNTER — Encounter (HOSPITAL_COMMUNITY): Payer: Self-pay | Admitting: *Deleted

## 2014-08-31 VITALS — BP 106/66 | HR 100 | Temp 97.9°F | Resp 15 | Wt 148.6 lb

## 2014-08-31 DIAGNOSIS — C3490 Malignant neoplasm of unspecified part of unspecified bronchus or lung: Secondary | ICD-10-CM | POA: Diagnosis present

## 2014-08-31 DIAGNOSIS — C3491 Malignant neoplasm of unspecified part of right bronchus or lung: Secondary | ICD-10-CM | POA: Insufficient documentation

## 2014-08-31 DIAGNOSIS — Z79899 Other long term (current) drug therapy: Secondary | ICD-10-CM

## 2014-08-31 DIAGNOSIS — K219 Gastro-esophageal reflux disease without esophagitis: Secondary | ICD-10-CM | POA: Insufficient documentation

## 2014-08-31 DIAGNOSIS — I10 Essential (primary) hypertension: Secondary | ICD-10-CM

## 2014-08-31 DIAGNOSIS — F17210 Nicotine dependence, cigarettes, uncomplicated: Secondary | ICD-10-CM

## 2014-08-31 DIAGNOSIS — F17200 Nicotine dependence, unspecified, uncomplicated: Secondary | ICD-10-CM

## 2014-08-31 DIAGNOSIS — D571 Sickle-cell disease without crisis: Secondary | ICD-10-CM

## 2014-08-31 DIAGNOSIS — Z139 Encounter for screening, unspecified: Secondary | ICD-10-CM

## 2014-08-31 DIAGNOSIS — J189 Pneumonia, unspecified organism: Secondary | ICD-10-CM

## 2014-08-31 DIAGNOSIS — Z720 Tobacco use: Secondary | ICD-10-CM

## 2014-08-31 DIAGNOSIS — I959 Hypotension, unspecified: Secondary | ICD-10-CM | POA: Insufficient documentation

## 2014-08-31 DIAGNOSIS — C774 Secondary and unspecified malignant neoplasm of inguinal and lower limb lymph nodes: Secondary | ICD-10-CM | POA: Diagnosis not present

## 2014-08-31 MED ORDER — DEXTROSE 5 % IV SOLN
1.5000 g | INTRAVENOUS | Status: AC
  Administered 2014-09-01: 1.5 g via INTRAVENOUS
  Filled 2014-08-31: qty 1.5

## 2014-08-31 NOTE — Patient Instructions (Signed)
Smoking Cessation Quitting smoking is important to your health and has many advantages. However, it is not always easy to quit since nicotine is a very addictive drug. Oftentimes, people try 3 times or more before being able to quit. This document explains the best ways for you to prepare to quit smoking. Quitting takes hard work and a lot of effort, but you can do it. ADVANTAGES OF QUITTING SMOKING  You will live longer, feel better, and live better.  Your body will feel the impact of quitting smoking almost immediately.  Within 20 minutes, blood pressure decreases. Your pulse returns to its normal level.  After 8 hours, carbon monoxide levels in the blood return to normal. Your oxygen level increases.  After 24 hours, the chance of having a heart attack starts to decrease. Your breath, hair, and body stop smelling like smoke.  After 48 hours, damaged nerve endings begin to recover. Your sense of taste and smell improve.  After 72 hours, the body is virtually free of nicotine. Your bronchial tubes relax and breathing becomes easier.  After 2 to 12 weeks, lungs can hold more air. Exercise becomes easier and circulation improves.  The risk of having a heart attack, stroke, cancer, or lung disease is greatly reduced.  After 1 year, the risk of coronary heart disease is cut in half.  After 5 years, the risk of stroke falls to the same as a nonsmoker.  After 10 years, the risk of lung cancer is cut in half and the risk of other cancers decreases significantly.  After 15 years, the risk of coronary heart disease drops, usually to the level of a nonsmoker.  If you are pregnant, quitting smoking will improve your chances of having a healthy baby.  The people you live with, especially any children, will be healthier.  You will have extra money to spend on things other than cigarettes. QUESTIONS TO THINK ABOUT BEFORE ATTEMPTING TO QUIT You may want to talk about your answers with your  health care provider.  Why do you want to quit?  If you tried to quit in the past, what helped and what did not?  What will be the most difficult situations for you after you quit? How will you plan to handle them?  Who can help you through the tough times? Your family? Friends? A health care provider?  What pleasures do you get from smoking? What ways can you still get pleasure if you quit? Here are some questions to ask your health care provider:  How can you help me to be successful at quitting?  What medicine do you think would be best for me and how should I take it?  What should I do if I need more help?  What is smoking withdrawal like? How can I get information on withdrawal? GET READY  Set a quit date.  Change your environment by getting rid of all cigarettes, ashtrays, matches, and lighters in your home, car, or work. Do not let people smoke in your home.  Review your past attempts to quit. Think about what worked and what did not. GET SUPPORT AND ENCOURAGEMENT You have a better chance of being successful if you have help. You can get support in many ways.  Tell your family, friends, and coworkers that you are going to quit and need their support. Ask them not to smoke around you.  Get individual, group, or telephone counseling and support. Programs are available at local hospitals and health centers. Call   your local health department for information about programs in your area.  Spiritual beliefs and practices may help some smokers quit.  Download a "quit meter" on your computer to keep track of quit statistics, such as how long you have gone without smoking, cigarettes not smoked, and money saved.  Get a self-help book about quitting smoking and staying off tobacco. LEARN NEW SKILLS AND BEHAVIORS  Distract yourself from urges to smoke. Talk to someone, go for a walk, or occupy your time with a task.  Change your normal routine. Take a different route to work.  Drink tea instead of coffee. Eat breakfast in a different place.  Reduce your stress. Take a hot bath, exercise, or read a book.  Plan something enjoyable to do every day. Reward yourself for not smoking.  Explore interactive web-based programs that specialize in helping you quit. GET MEDICINE AND USE IT CORRECTLY Medicines can help you stop smoking and decrease the urge to smoke. Combining medicine with the above behavioral methods and support can greatly increase your chances of successfully quitting smoking.  Nicotine replacement therapy helps deliver nicotine to your body without the negative effects and risks of smoking. Nicotine replacement therapy includes nicotine gum, lozenges, inhalers, nasal sprays, and skin patches. Some may be available over-the-counter and others require a prescription.  Antidepressant medicine helps people abstain from smoking, but how this works is unknown. This medicine is available by prescription.  Nicotinic receptor partial agonist medicine simulates the effect of nicotine in your brain. This medicine is available by prescription. Ask your health care provider for advice about which medicines to use and how to use them based on your health history. Your health care provider will tell you what side effects to look out for if you choose to be on a medicine or therapy. Carefully read the information on the package. Do not use any other product containing nicotine while using a nicotine replacement product.  RELAPSE OR DIFFICULT SITUATIONS Most relapses occur within the first 3 months after quitting. Do not be discouraged if you start smoking again. Remember, most people try several times before finally quitting. You may have symptoms of withdrawal because your body is used to nicotine. You may crave cigarettes, be irritable, feel very hungry, cough often, get headaches, or have difficulty concentrating. The withdrawal symptoms are only temporary. They are strongest  when you first quit, but they will go away within 10-14 days. To reduce the chances of relapse, try to:  Avoid drinking alcohol. Drinking lowers your chances of successfully quitting.  Reduce the amount of caffeine you consume. Once you quit smoking, the amount of caffeine in your body increases and can give you symptoms, such as a rapid heartbeat, sweating, and anxiety.  Avoid smokers because they can make you want to smoke.  Do not let weight gain distract you. Many smokers will gain weight when they quit, usually less than 10 pounds. Eat a healthy diet and stay active. You can always lose the weight gained after you quit.  Find ways to improve your mood other than smoking. FOR MORE INFORMATION  www.smokefree.gov  Document Released: 07/30/2001 Document Revised: 12/20/2013 Document Reviewed: 11/14/2011 Exit[employer] Patient Information 2015 [employer]. This information is not intended to replace advice given to you by your health care provider. Make sure you discuss any questions you have with your health care provider.  

## 2014-08-31 NOTE — Progress Notes (Signed)
Patient here to establish Patient present in office today with hypotension Blood pressure done with dinamap and manual

## 2014-08-31 NOTE — Progress Notes (Signed)
Pt denies SOB, chest pain, and being under the care of a cardiologist. Pt denies having a chest x ray, EKG, stress test, echo, and cardiac cath

## 2014-08-31 NOTE — Progress Notes (Signed)
Patient Demographics  Brendan Hooper, is a 69 y.o. male  KTG:[id number]  SKA:[id number]  DOB - 1946-04-16  CC:  Chief Complaint  Patient presents with  . Establish Care       HPI: Brendan Hooper is a 69 y.o. male here today to establish medical care.patient has history of tobacco abuse, recently hospitalized her was diagnosed with postobstructive pneumonia and was treated with antibiotic, he also had a lung mass, he he had lymph node biopsy done on his right groin  Which came back positive for non-small cell carcinoma, patient does have bronchogenic carcinoma which is metastatic, patient recently followed up with cardiothoracic surgeon and is in the preparation to get her Port-A-Cath done for chemotherapy, his blood pressure is low and patient is tachycardic, he denies any chest pain shortness of breath headache dizziness, patient is to smoke cigarettes, advised patient to quit smoking. Patient has No headache, No chest pain, No abdominal pain - No Nausea, No new weakness tingling or numbness, No Cough - SOB.  No Known Allergies Past Medical History  Diagnosis Date  . Hypertension   . GERD (gastroesophageal reflux disease)   . Headache(784.0)   . Sickle cell anemia   . Bronchogenic cancer of right lung 08/25/2014   Current Outpatient Prescriptions on File Prior to Visit  Medication Sig Dispense Refill  . acetaminophen (TYLENOL) 325 MG tablet Take 2 tablets (650 mg total) by mouth every 6 (six) hours as needed for mild pain, moderate pain or fever.    Marland Kitchen dexamethasone (DECADRON) 4 MG tablet 4 mg by mouth twice a day the day before, day of and day after the chemotherapy every 3 weeks 40 tablet 1  . folic acid (FOLVITE) 1 MG tablet Take 1 tablet (1 mg total) by mouth daily. 30 tablet 0  . Multiple Vitamin (MULTIVITAMIN WITH MINERALS) TABS tablet Take 1 tablet by mouth daily. 30 tablet 0  . nicotine (NICODERM CQ - DOSED IN MG/24 HOURS) 21 mg/24hr patch Place 1 patch (21 mg total) onto  the skin daily. 28 patch 0  . prochlorperazine (COMPAZINE) 10 MG tablet Take 1 tablet (10 mg total) by mouth every 6 (six) hours as needed for nausea or vomiting. 60 tablet 0  . thiamine 100 MG tablet Take 1 tablet (100 mg total) by mouth daily. 30 tablet 0   Current Facility-Administered Medications on File Prior to Visit  Medication Dose Route Frequency Provider Last Rate Last Dose  . cefUROXime (ZINACEF) 1.5 g in dextrose 5 % 50 mL IVPB  1.5 g Intravenous 60 min Pre-Op Melrose Nakayama, MD       Family History  Problem Relation Age of Onset  . Cancer Mother   . Stroke Father    History   Social History  . Marital Status: Single    Spouse Name: N/A    Number of Children: N/A  . Years of Education: N/A   Occupational History  . Not on file.   Social History Main Topics  . Smoking status: Current Every Day Smoker -- 0.50 packs/day for 40 years    Types: Cigarettes  . Smokeless tobacco: Never Used  . Alcohol Use: 0.0 oz/week    0 Not specified per week     Comment: a pint of liquor a day  . Drug Use: No  . Sexual Activity: Yes   Other Topics Concern  . Not on file   Social History Narrative    Review of Systems: Constitutional: Negative for  fever, chills, diaphoresis, activity change, appetite change and fatigue. HENT: Negative for ear pain, nosebleeds, congestion, facial swelling, rhinorrhea, neck pain, neck stiffness and ear discharge.  Eyes: Negative for pain, discharge, redness, itching and visual disturbance. Respiratory: Negative for cough, choking, chest tightness, shortness of breath, wheezing and stridor.  Cardiovascular: Negative for chest pain, palpitations and leg swelling. Gastrointestinal: Negative for abdominal distention. Genitourinary: Negative for dysuria, urgency, frequency, hematuria, flank pain, decreased urine volume, difficulty urinating and dyspareunia.  Musculoskeletal: Negative for back pain, joint swelling, arthralgia and gait  problem. Neurological: Negative for dizziness, tremors, seizures, syncope, facial asymmetry, speech difficulty, weakness, light-headedness, numbness and headaches.  Hematological: Negative for adenopathy. Does not bruise/bleed easily. Psychiatric/Behavioral: Negative for hallucinations, behavioral problems, confusion, dysphoric mood, decreased concentration and agitation.    Objective:   Filed Vitals:   08/31/14 1721  BP: 106/66  Pulse: 100  Temp:   Resp:     Physical Exam: Constitutional: Patient appears well-developed and well-nourished. No distress. HENT: Normocephalic, atraumatic, External right and left ear normal. Oropharynx is clear and moist.  Eyes: Conjunctivae and EOM are normal. PERRLA, no scleral icterus. Neck: Normal ROM. Neck supple. No JVD. No tracheal deviation. No thyromegaly. CVS: RRR, S1/S2 +, no murmurs, no gallops, no carotid bruit.  Pulmonary: Effort and breath sounds normal, no stridor, rhonchi, wheezes, rales.  Abdominal: Soft. BS +, no distension, tenderness, rebound or guarding.  Musculoskeletal: Normal range of motion. No edema and no tenderness.  Neuro: Alert. Normal reflexes, muscle tone coordination. No cranial nerve deficit. Skin: Skin is warm and dry. No rash noted. Not diaphoretic. No erythema. No pallor. Psychiatric: Normal mood and affect. Behavior, judgment, thought content normal.  Lab Results  Component Value Date   WBC 7.2 08/25/2014   HGB 15.1 08/25/2014   HCT 45.9 08/25/2014   MCV 80.1 08/25/2014   PLT 588* 08/25/2014   Lab Results  Component Value Date   CREATININE 0.8 08/25/2014   BUN 7.6 08/25/2014   NA 140 08/25/2014   K 4.1 08/25/2014   CL 101 08/11/2014   CO2 28 08/25/2014    No results found for: HGBA1C Lipid Panel  No results found for: CHOL, TRIG, HDL, CHOLHDL, VLDL, LDLCALC     Assessment and plan:   1. Bronchogenic cancer of right lung Currently following up with oncologist and is in the process to get a  Port-A-Cath done for chemotherapy.  2. Postobstructive pneumonia Patient has completed the course of antibiotic  3. Smoker Advised patient to quit smoking.  4. Hypotension, unspecified hypotension type Patient was given IV fluids, his blood pressure improved to currently 106/66 and pulse of 100   Health Maintenance Patient recently had a blood work done which was reviewed  -Vaccinations:  Patient is up-to-date with flu shot  Follow up in 3 months.  [name], MD

## 2014-09-01 ENCOUNTER — Encounter (HOSPITAL_COMMUNITY)
Admission: RE | Disposition: A | Discharge status: Home / Self Care | Payer: Self-pay | Source: Ambulatory Visit | Attending: Thoracic Surgery (Cardiothoracic Vascular Surgery)

## 2014-09-01 ENCOUNTER — Ambulatory Visit (HOSPITAL_COMMUNITY): Payer: Medicare Other

## 2014-09-01 ENCOUNTER — Ambulatory Visit (HOSPITAL_COMMUNITY)
Admission: RE | Admit: 2014-09-01 | Discharge: 2014-09-01 | Disposition: A | Discharge status: Home / Self Care | Payer: Medicare Other | Source: Ambulatory Visit | Attending: Thoracic Surgery (Cardiothoracic Vascular Surgery) | Admitting: Thoracic Surgery (Cardiothoracic Vascular Surgery)

## 2014-09-01 ENCOUNTER — Ambulatory Visit (HOSPITAL_COMMUNITY): Payer: Medicare Other | Admitting: Certified Registered Nurse Anesthetist

## 2014-09-01 ENCOUNTER — Other Ambulatory Visit: Payer: Self-pay

## 2014-09-01 ENCOUNTER — Encounter (HOSPITAL_COMMUNITY): Payer: Self-pay | Admitting: *Deleted

## 2014-09-01 DIAGNOSIS — K219 Gastro-esophageal reflux disease without esophagitis: Secondary | ICD-10-CM | POA: Insufficient documentation

## 2014-09-01 DIAGNOSIS — Z419 Encounter for procedure for purposes other than remedying health state, unspecified: Secondary | ICD-10-CM

## 2014-09-01 DIAGNOSIS — F17210 Nicotine dependence, cigarettes, uncomplicated: Secondary | ICD-10-CM | POA: Insufficient documentation

## 2014-09-01 DIAGNOSIS — C774 Secondary and unspecified malignant neoplasm of inguinal and lower limb lymph nodes: Secondary | ICD-10-CM | POA: Diagnosis not present

## 2014-09-01 DIAGNOSIS — C3490 Malignant neoplasm of unspecified part of unspecified bronchus or lung: Secondary | ICD-10-CM

## 2014-09-01 DIAGNOSIS — C3491 Malignant neoplasm of unspecified part of right bronchus or lung: Secondary | ICD-10-CM

## 2014-09-01 DIAGNOSIS — D571 Sickle-cell disease without crisis: Secondary | ICD-10-CM | POA: Insufficient documentation

## 2014-09-01 DIAGNOSIS — I10 Essential (primary) hypertension: Secondary | ICD-10-CM | POA: Diagnosis not present

## 2014-09-01 HISTORY — DX: Other nonspecific abnormal finding of lung field: R91.8

## 2014-09-01 HISTORY — PX: PORTACATH PLACEMENT: SHX2246

## 2014-09-01 LAB — COMPREHENSIVE METABOLIC PANEL
ALT: 10 U/L (ref 0–53)
AST: 23 U/L (ref 0–37)
Albumin: 2.8 g/dL — ABNORMAL LOW (ref 3.5–5.2)
Alkaline Phosphatase: 57 U/L (ref 39–117)
Anion gap: 15 (ref 5–15)
BILIRUBIN TOTAL: 0.6 mg/dL (ref 0.3–1.2)
BUN: 6 mg/dL (ref 6–23)
CALCIUM: 9.9 mg/dL (ref 8.4–10.5)
CO2: 25 mmol/L (ref 19–32)
CREATININE: 0.8 mg/dL (ref 0.50–1.35)
Chloride: 101 mEq/L (ref 96–112)
GFR, EST NON AFRICAN AMERICAN: 89 mL/min — AB (ref >90)
Glucose, Bld: 113 mg/dL — ABNORMAL HIGH (ref 70–99)
Potassium: 5 mmol/L (ref 3.5–5.1)
Sodium: 141 mmol/L (ref 135–145)
TOTAL PROTEIN: 7.2 g/dL (ref 6.0–8.3)

## 2014-09-01 LAB — APTT: aPTT: 44 seconds — ABNORMAL HIGH (ref 24–37)

## 2014-09-01 LAB — CBC
HEMATOCRIT: 39.4 % (ref 39.0–52.0)
Hemoglobin: 13.7 g/dL (ref 13.0–17.0)
MCH: 26.8 pg (ref 26.0–34.0)
MCHC: 34.8 g/dL (ref 30.0–36.0)
MCV: 77 fL — AB (ref 78.0–100.0)
Platelets: 420 10*3/uL — ABNORMAL HIGH (ref 150–400)
RBC: 5.12 MIL/uL (ref 4.22–5.81)
RDW: 15.9 % — AB (ref 11.5–15.5)
WBC: 7 10*3/uL (ref 4.0–10.5)

## 2014-09-01 LAB — PROTIME-INR
INR: 1.22 (ref 0.00–1.49)
Prothrombin Time: 15.5 seconds — ABNORMAL HIGH (ref 11.6–15.2)

## 2014-09-01 SURGERY — INSERTION, TUNNELED CENTRAL VENOUS DEVICE, WITH PORT
Anesthesia: Monitor Anesthesia Care | Site: Chest | Laterality: Left

## 2014-09-01 MED ORDER — ACETAMINOPHEN 650 MG RE SUPP
650.0000 mg | RECTAL | Status: DC | PRN
  Filled 2014-09-01: qty 1

## 2014-09-01 MED ORDER — MIDAZOLAM HCL 5 MG/5ML IJ SOLN
INTRAMUSCULAR | Status: DC | PRN
  Administered 2014-09-01: 2 mg via INTRAVENOUS

## 2014-09-01 MED ORDER — FENTANYL CITRATE 0.05 MG/ML IJ SOLN
INTRAMUSCULAR | Status: DC | PRN
  Administered 2014-09-01 (×4): 25 ug via INTRAVENOUS

## 2014-09-01 MED ORDER — PROPOFOL INFUSION 10 MG/ML OPTIME
INTRAVENOUS | Status: DC | PRN
  Administered 2014-09-01: 50 ug/kg/min via INTRAVENOUS

## 2014-09-01 MED ORDER — ACETAMINOPHEN 325 MG PO TABS
650.0000 mg | ORAL_TABLET | ORAL | Status: DC | PRN
  Filled 2014-09-01: qty 2

## 2014-09-01 MED ORDER — PROPOFOL 10 MG/ML IV BOLUS
INTRAVENOUS | Status: AC
  Filled 2014-09-01: qty 20

## 2014-09-01 MED ORDER — FENTANYL CITRATE 0.05 MG/ML IJ SOLN
INTRAMUSCULAR | Status: AC
  Filled 2014-09-01: qty 5

## 2014-09-01 MED ORDER — SODIUM CHLORIDE 0.9 % IR SOLN
Status: DC | PRN
  Administered 2014-09-01: 500 mL

## 2014-09-01 MED ORDER — FENTANYL CITRATE 0.05 MG/ML IJ SOLN: 25.0000 ug | INTRAMUSCULAR | Status: DC | PRN

## 2014-09-01 MED ORDER — OXYCODONE HCL 5 MG PO TABS: 5.0000 mg | ORAL_TABLET | ORAL | Status: DC | PRN

## 2014-09-01 MED ORDER — HEPARIN SODIUM (PORCINE) 1000 UNIT/ML IJ SOLN
INTRAMUSCULAR | Status: AC
  Filled 2014-09-01: qty 1

## 2014-09-01 MED ORDER — MIDAZOLAM HCL 2 MG/2ML IJ SOLN
INTRAMUSCULAR | Status: AC
  Filled 2014-09-01: qty 2

## 2014-09-01 MED ORDER — PROPOFOL 10 MG/ML IV BOLUS
INTRAVENOUS | Status: DC | PRN
  Administered 2014-09-01: 10 mg via INTRAVENOUS
  Administered 2014-09-01 (×2): 20 mg via INTRAVENOUS
  Administered 2014-09-01 (×2): 10 mg via INTRAVENOUS

## 2014-09-01 MED ORDER — LIDOCAINE HCL (PF) 1 % IJ SOLN
INTRAMUSCULAR | Status: DC | PRN
  Administered 2014-09-01: 13 mL

## 2014-09-01 MED ORDER — HEPARIN SODIUM (PORCINE) 1000 UNIT/ML IJ SOLN
INTRAMUSCULAR | Status: DC | PRN
  Administered 2014-09-01: 2.5 mL

## 2014-09-01 MED ORDER — LACTATED RINGERS IV SOLN
INTRAVENOUS | Status: DC | PRN
  Administered 2014-09-01: 07:00:00 via INTRAVENOUS

## 2014-09-01 MED ORDER — ONDANSETRON HCL 4 MG/2ML IJ SOLN: 4.0000 mg | Freq: Four times a day (QID) | INTRAMUSCULAR | Status: DC | PRN

## 2014-09-01 MED ORDER — LIDOCAINE HCL (PF) 1 % IJ SOLN
INTRAMUSCULAR | Status: AC
  Filled 2014-09-01: qty 30

## 2014-09-01 SURGICAL SUPPLY — 42 items
BAG DECANTER FOR FLEXI CONT (MISCELLANEOUS) ×3 IMPLANT
CANISTER SUCTION 2500CC (MISCELLANEOUS) ×3 IMPLANT
COVER SURGICAL LIGHT HANDLE (MISCELLANEOUS) ×3 IMPLANT
DERMABOND ADVANCED (GAUZE/BANDAGES/DRESSINGS) ×2
DERMABOND ADVANCED .7 DNX12 (GAUZE/BANDAGES/DRESSINGS) ×1 IMPLANT
DRAPE C-ARM 42X72 X-RAY (DRAPES) ×3 IMPLANT
DRAPE CHEST BREAST 15X10 FENES (DRAPES) ×3 IMPLANT
ELECT REM PT RETURN 9FT ADLT (ELECTROSURGICAL) ×3
ELECTRODE REM PT RTRN 9FT ADLT (ELECTROSURGICAL) ×1 IMPLANT
GLOVE BIO SURGEON STRL SZ 6.5 (GLOVE) ×4 IMPLANT
GLOVE BIO SURGEONS STRL SZ 6.5 (GLOVE) ×2
GLOVE BIOGEL PI IND STRL 6.5 (GLOVE) ×1 IMPLANT
GLOVE BIOGEL PI INDICATOR 6.5 (GLOVE) ×2
GLOVE SURG SIGNA 7.5 PF LTX (GLOVE) ×3 IMPLANT
GOWN STRL REUS W/ TWL LRG LVL3 (GOWN DISPOSABLE) ×1 IMPLANT
GOWN STRL REUS W/ TWL XL LVL3 (GOWN DISPOSABLE) ×1 IMPLANT
GOWN STRL REUS W/TWL LRG LVL3 (GOWN DISPOSABLE) ×2
GOWN STRL REUS W/TWL XL LVL3 (GOWN DISPOSABLE) ×2
GUIDEWIRE UNCOATED ST S 7038 (WIRE) IMPLANT
INTRODUCER 13FR (MISCELLANEOUS) IMPLANT
INTRODUCER COOK 11FR (CATHETERS) IMPLANT
KIT BASIN OR (CUSTOM PROCEDURE TRAY) ×3 IMPLANT
KIT PORT POWER 9.6FR MRI PREA (Catheter) ×3 IMPLANT
KIT PORT POWER ISP 8FR (Catheter) IMPLANT
KIT POWER CATH 8FR (Catheter) IMPLANT
KIT ROOM TURNOVER OR (KITS) ×3 IMPLANT
LIQUID BAND (GAUZE/BANDAGES/DRESSINGS) ×3 IMPLANT
NEEDLE HYPO 25GX1X1/2 BEV (NEEDLE) ×3 IMPLANT
NS IRRIG 1000ML POUR BTL (IV SOLUTION) ×3 IMPLANT
PACK GENERAL/GYN (CUSTOM PROCEDURE TRAY) ×3 IMPLANT
PAD ARMBOARD 7.5X6 YLW CONV (MISCELLANEOUS) ×6 IMPLANT
SET SHEATH INTRODUCER 10FR (MISCELLANEOUS) IMPLANT
SUT MNCRL AB 4-0 PS2 18 (SUTURE) IMPLANT
SUT VIC AB 3-0 SH 27 (SUTURE) ×4
SUT VIC AB 3-0 SH 27X BRD (SUTURE) ×2 IMPLANT
SUT VICRYL 4-0 PS2 18IN ABS (SUTURE) ×3 IMPLANT
SYR 20CC LL (SYRINGE) ×3 IMPLANT
SYR 5ML LL (SYRINGE) ×3 IMPLANT
SYR CONTROL 10ML LL (SYRINGE) ×3 IMPLANT
TOWEL OR 17X24 6PK STRL BLUE (TOWEL DISPOSABLE) ×3 IMPLANT
TOWEL OR 17X26 10 PK STRL BLUE (TOWEL DISPOSABLE) ×3 IMPLANT
WATER STERILE IRR 1000ML POUR (IV SOLUTION) ×3 IMPLANT

## 2014-09-01 NOTE — H&P (View-Only) (Signed)
PCP is Antonietta Jewel, MD Referring Provider is Antonietta Jewel, MD  No chief complaint on file.   HPI:69 yo man with a history of tobacco abuse who recently was hospitalized with a post- obstructive pneumonia of the right lung. He has a large right lung mass. He also had right inguinal adenopathy. Needle biopsy of the right inguinal node was positive for metastatic non-small cell lung cancer. He needs IV access for chemo.    Past Medical History  Diagnosis Date  . Hypertension   . GERD (gastroesophageal reflux disease)   . Headache(784.0)   . Sickle cell anemia   . Bronchogenic cancer of right lung 08/25/2014    Past Surgical History  Procedure Laterality Date  . No past surgeries      No family history on file.  Social History History  Substance Use Topics  . Smoking status: Current Every Day Smoker -- 0.50 packs/day for 40 years    Types: Cigarettes  . Smokeless tobacco: Never Used  . Alcohol Use: Yes     Comment: a pint of liquor a day    Current Outpatient Prescriptions  Medication Sig Dispense Refill  . acetaminophen (TYLENOL) 325 MG tablet Take 2 tablets (650 mg total) by mouth every 6 (six) hours as needed for mild pain, moderate pain or fever.    . folic acid (FOLVITE) 1 MG tablet Take 1 tablet (1 mg total) by mouth daily. 30 tablet 0  . Multiple Vitamin (MULTIVITAMIN WITH MINERALS) TABS tablet Take 1 tablet by mouth daily. 30 tablet 0  . nicotine (NICODERM CQ - DOSED IN MG/24 HOURS) 21 mg/24hr patch Place 1 patch (21 mg total) onto the skin daily. 28 patch 0  . thiamine 100 MG tablet Take 1 tablet (100 mg total) by mouth daily. 30 tablet 0   No current facility-administered medications for this visit.    No Known Allergies  Review of Systems  Constitutional: Positive for unexpected weight change.  Respiratory: Positive for cough, shortness of breath and wheezing.   Musculoskeletal:       Right hip pain  Hematological: Positive for adenopathy.    There were  no vitals taken for this visit. Physical Exam  Constitutional: He is oriented to person, place, and time. No distress.  thin  HENT:  Head: Normocephalic and atraumatic.  Eyes: EOM are normal. Pupils are equal, round, and reactive to light.  Cardiovascular: Normal rate, regular rhythm and normal heart sounds.   No murmur heard. Pulmonary/Chest:  Diminished BS right base  Abdominal: Soft. There is no tenderness.  Musculoskeletal: He exhibits no edema.  Lymphadenopathy:    He has no cervical adenopathy.  Neurological: He is alert and oriented to person, place, and time. No cranial nerve deficit.  Skin: Skin is warm and dry.  Vitals reviewed.    Diagnostic Tests: FINAL DIAGNOSIS Diagnosis Lymph node, biopsy, Rt inguinal, needle core biopsy - METASTATIC NON-SMALL CELL CARCINOMA CONSISTENT WITH ADENOCARCINOMA. - SEE COMMENT. Microscopic Comment The malignant cells are positive for TTF-1  Impression: 89 yop man with stage IV lung cancer needs IV access for chemotherapy. I recommended to Mr. Olmo that we place a port-a-cath for access. I described the indications, risks, benefits, and alternatives with Mr. Edenfield. He understands the risks include but are not limited to bleeding, pneumothorax, infection, thrombosis, or other unforeseeable complications. He agrees to proceed. For OR Thursday 09/01/2014  Plan:

## 2014-09-01 NOTE — Interval H&P Note (Signed)
History and Physical Interval Note:  09/01/2014 7:55 AM  Brendan Hooper  has presented today for surgery, with the diagnosis of LUNG CANCER  The various methods of treatment have been discussed with the patient and family. After consideration of risks, benefits and other options for treatment, the patient has consented to  Procedure(s): INSERTION PORT-A-CATH (Bilateral) as a surgical intervention .  The patient's history has been reviewed, patient examined, no change in status, stable for surgery.  I have reviewed the patient's chart and labs.  Questions were answered to the patient's satisfaction.     Nicholus Chandran C

## 2014-09-01 NOTE — Anesthesia Preprocedure Evaluation (Signed)
Anesthesia Evaluation  Patient identified by MRN, date of birth, ID band Patient awake    Reviewed: Allergy & Precautions, NPO status , Patient's Chart, lab work & pertinent test results  Airway Mallampati: II   Neck ROM: full    Dental   Pulmonary Current Smoker,  Lung CA         Cardiovascular hypertension,     Neuro/Psych  Headaches,    GI/Hepatic GERD-  ,(+)     substance abuse  alcohol use,   Endo/Other    Renal/GU      Musculoskeletal   Abdominal   Peds  Hematology  (+) Blood dyscrasia, Sickle cell anemia ,   Anesthesia Other Findings   Reproductive/Obstetrics                             Anesthesia Physical Anesthesia Plan  ASA: III  Anesthesia Plan: MAC   Post-op Pain Management:    Induction: Intravenous  Airway Management Planned: Simple Face Mask  Additional Equipment:   Intra-op Plan:   Post-operative Plan:   Informed Consent: I have reviewed the patients History and Physical, chart, labs and discussed the procedure including the risks, benefits and alternatives for the proposed anesthesia with the patient or authorized representative who has indicated his/her understanding and acceptance.     Plan Discussed with: CRNA, Anesthesiologist and Surgeon  Anesthesia Plan Comments:         Anesthesia Quick Evaluation

## 2014-09-01 NOTE — Anesthesia Postprocedure Evaluation (Signed)
Anesthesia Post Note  Patient: Brendan Hooper  Procedure(s) Performed: Procedure(s) (LRB): INSERTION PORT-A-CATH (Left)  Anesthesia type: MAC  Patient location: PACU  Post pain: Pain level controlled and Adequate analgesia  Post assessment: Post-op Vital signs reviewed, Patient's Cardiovascular Status Stable and Respiratory Function Stable  Last Vitals:  Filed Vitals:   09/01/14 0932  BP: 120/66  Pulse: 85  Temp:   Resp: 15    Post vital signs: Reviewed and stable  Level of consciousness: awake, alert  and oriented  Complications: No apparent anesthesia complications

## 2014-09-01 NOTE — Brief Op Note (Signed)
09/01/2014  9:00 AM  PATIENT:  Brendan Hooper  69 y.o. male  PRE-OPERATIVE DIAGNOSIS:  LUNG CANCER  POST-OPERATIVE DIAGNOSIS:  LUNG CANCER  PROCEDURE:  Procedure(s): INSERTION PORT-A-CATH (Left)  SURGEON:  Surgeon(s) and Role:    * Melrose Nakayama, MD - Primary  ANESTHESIA:   local and IV sedation  EBL:  Total I/O In: 550 [I.V.:550] Out: 5 [Blood:5]  BLOOD ADMINISTERED:none  DRAINS: none   LOCAL MEDICATIONS USED:  LIDOCAINE  and Amount: 10 ml  SPECIMEN:  No Specimen  DISPOSITION OF SPECIMEN:  N/A  COUNTS:  YES  PLAN OF CARE: Discharge to home after PACU  PATIENT DISPOSITION:  PACU - hemodynamically stable.   Delay start of Pharmacological VTE agent (>24hrs) due to surgical blood loss or risk of bleeding: not applicable

## 2014-09-01 NOTE — Op Note (Signed)
NAMECOLSTON, PYLE                ACCOUNT NO.:  [id number]  MEDICAL RECORD NO.:  [id number]  LOCATION:  [facility]                         FACILITY:  [facility]  PHYSICIAN:  Revonda Standard. Roxan Hockey, M.D.DATE OF BIRTH:  01-Oct-1945  DATE OF PROCEDURE:  09/01/2014 DATE OF DISCHARGE:  09/01/2014                              OPERATIVE REPORT   PREOPERATIVE DIAGNOSIS:  Stage IV lung cancer, needs IV access for chemotherapy.  POSTOPERATIVE DIAGNOSIS:  Stage IV lung cancer, needs IV access for chemotherapy.  PROCEDURE:  Port-A-Cath placement left subclavian vein.  SURGEON:  Revonda Standard. Roxan Hockey, M.D.  ANESTHESIA:  Local with intravenous sedation.  FINDINGS:  Vein accessed on first pass, catheter positioned at the junction of superior vena cava and right atrium.  CLINICAL NOTE:  Mr. Schellhorn is a 69 year old gentleman who was recently diagnosed with stage IV lung cancer.  He needs IV access for chemotherapy.  He was advised to have a Port-A-Cath placed.  The indications, risks, benefits, and alternatives were discussed in detail with the patient. He understood and accepted the risks and agreed to proceed.  OPERATIVE NOTE:  Mr. Kanady was brought to the operating room on September 01, 2014.  He was given intravenous sedation and monitored by the anesthesia service.  The chest was prepped and draped in usual sterile fashion.  The area for needle insertion to access the subclavian vein was anesthetized with 1% lidocaine.  After achieving adequate local anesthetic effect, the patient was placed in Trendelenburg position. The left subclavian vein was accessed using the Seldinger technique. The vein was accessed on the first pass.  The wire passed easily. Fluoroscopy confirmed positioning of the wire within the right atrium. The area for the skin incision and the pocket for the port then was Anesthetized. A total of 10 mL of 1% lidocaine was used in all.  An incision was made incorporating the wire.   Hemostasis was achieved with electrocautery.  A pocket was created anterior to the pectoralis fascia. A peel-away sheath introducer then was advanced over the wire.  The inner cannula was removed.  The port was cut to 20 cm.  It then was advanced through the introducer, which was then removed.  Fluoroscopy confirmed position of the catheter at the junction of the right atrium and superior vena cava.  There was good withdrawal from the port and it flushed easily with heparinized saline.  The port then was tacked to the pectoralis fascia with 3-0 Vicryl sutures.  It was flushed with 2.5 mL of concentrated heparin.  A final inspection was made for hemostasis.  The wound was irrigated.  The subcutaneous tissue was closed with 3-0 Vicryl subcutaneous suture and a 4-0 Vicryl subcuticular suture.  Dermabond was applied.  Sponge, needle, instrument counts were correct.  The patient was taken from the operating room to the postanesthetic care unit in good condition.     Revonda Standard Roxan Hockey, M.D.     SCH/MEDQ  D:  09/01/2014  T:  09/01/2014  Job:  [id number]

## 2014-09-01 NOTE — Transfer of Care (Signed)
Immediate Anesthesia Transfer of Care Note  Patient: Brendan Hooper  Procedure(s) Performed: Procedure(s): INSERTION PORT-A-CATH (Left)  Patient Location: PACU  Anesthesia Type:MAC  Level of Consciousness: awake, alert  and oriented  Airway & Oxygen Therapy: Patient Spontanous Breathing  Post-op Assessment: Report given to PACU RN and Post -op Vital signs reviewed and stable  Post vital signs: Reviewed and stable  Complications: No apparent anesthesia complications

## 2014-09-01 NOTE — Discharge Instructions (Addendum)
Do not drive or engage in heavy physical activity for 24 hours.  You should not drive while taking pain medication.   You may use Tylenol or Advil in addition to or instead of the prescription pain medication  You may shower tomorrow  Call [phone] if you develop fever > 101, excessive pain, shortness of breath, redness around the incision or drainage from the incision.  What to eat:  For your first meals, you should eat lightly; only small meals initially.  If you do not have nausea, you may eat larger meals.  Avoid spicy, greasy and heavy food.    General Anesthesia, Adult, Care After  Refer to this sheet in the next few weeks. These instructions provide you with information on caring for yourself after your procedure. Your health care provider may also give you more specific instructions. Your treatment has been planned according to current medical practices, but problems sometimes occur. Call your health care provider if you have any problems or questions after your procedure.  WHAT TO EXPECT AFTER THE PROCEDURE  After the procedure, it is typical to experience:  Sleepiness.  Nausea and vomiting. HOME CARE INSTRUCTIONS  For the first 24 hours after general anesthesia:  Have a responsible person with you.  Do not drive a car. If you are alone, do not take public transportation.  Do not drink alcohol.  Do not take medicine that has not been prescribed by your health care provider.  Do not sign important papers or make important decisions.  You may resume a normal diet and activities as directed by your health care provider.  Change bandages (dressings) as directed.  If you have questions or problems that seem related to general anesthesia, call the hospital and ask for the anesthetist or anesthesiologist on call. SEEK MEDICAL CARE IF:  You have nausea and vomiting that continue the day after anesthesia.  You develop a rash. SEEK IMMEDIATE MEDICAL CARE IF:  You have difficulty  breathing.  You have chest pain.  You have any allergic problems. Document Released: 11/11/2000 Document Revised: 04/07/2013 Document Reviewed: 02/18/2013  [facility] Patient Information 2014 [place], [place].   Laceration Care, Adult    A laceration is a cut that goes through all layers of the skin. The cut goes into the tissue beneath the skin.  HOME CARE  For stitches (sutures) or staples:  Keep the cut clean and dry.  If you have a bandage (dressing), change it at least once a day. Change the bandage if it gets wet or dirty, or as told by your doctor.  Wash the cut with soap and water 2 times a day. Rinse the cut with water. Pat it dry with a clean towel.  Put a thin layer of medicated cream on the cut as told by your doctor.  You may shower after the first 24 hours. Do not soak the cut in water until the stitches are removed.  Only take medicines as told by your doctor.  Have your stitches or staples removed as told by your doctor. For skin adhesive strips:  Keep the cut clean and dry.  Do not get the strips wet. You may take a bath, but be careful to keep the cut dry.  If the cut gets wet, pat it dry with a clean towel.  The strips will fall off on their own. Do not remove the strips that are still stuck to the cut. For wound glue:  You may shower or take baths. Do not  soak or scrub the cut. Do not swim. Avoid heavy sweating until the glue falls off on its own. After a shower or bath, pat the cut dry with a clean towel.  Do not put medicine on your cut until the glue falls off.  If you have a bandage, do not put tape over the glue.  Avoid lots of sunlight or tanning lamps until the glue falls off. Put sunscreen on the cut for the first year to reduce your scar.  The glue will fall off on its own. Do not pick at the glue. You may need a tetanus shot if:  You cannot remember when you had your last tetanus shot.  You have never had a tetanus shot. If you need a tetanus shot and you  choose not to have one, you may get tetanus. Sickness from tetanus can be serious.  GET HELP RIGHT AWAY IF:  Your pain does not get better with medicine.  Your arm, hand, leg, or foot loses feeling (numbness) or changes color.  Your cut is bleeding.  Your joint feels weak, or you cannot use your joint.  You have painful lumps on your body.  Your cut is red, puffy (swollen), or painful.  You have a red line on the skin near the cut.  You have yellowish-white fluid (pus) coming from the cut.  You have a fever.  You have a bad smell coming from the cut or bandage.  Your cut breaks open before or after stitches are removed.  You notice something coming out of the cut, such as wood or glass.  You cannot move a finger or toe. MAKE SURE YOU:  Understand these instructions.  Will watch your condition.  Will get help right away if you are not doing well or get worse. Document Released: 01/22/2008 Document Revised: 10/28/2011 Document Reviewed: 01/29/2011  [facility] Patient Information 2014 [place].

## 2014-09-02 ENCOUNTER — Ambulatory Visit: Payer: Medicare Other

## 2014-09-02 ENCOUNTER — Ambulatory Visit (HOSPITAL_COMMUNITY)
Admission: RE | Admit: 2014-09-02 | Discharge: 2014-09-02 | Disposition: A | Discharge status: Home / Self Care | Payer: Medicare Other | Source: Ambulatory Visit | Attending: Internal Medicine | Admitting: Internal Medicine

## 2014-09-02 ENCOUNTER — Other Ambulatory Visit: Payer: Self-pay | Admitting: *Deleted

## 2014-09-02 DIAGNOSIS — C3491 Malignant neoplasm of unspecified part of right bronchus or lung: Secondary | ICD-10-CM | POA: Insufficient documentation

## 2014-09-02 DIAGNOSIS — Z79899 Other long term (current) drug therapy: Secondary | ICD-10-CM | POA: Insufficient documentation

## 2014-09-02 LAB — GLUCOSE, CAPILLARY: GLUCOSE-CAPILLARY: 104 mg/dL — AB (ref 70–99)

## 2014-09-02 MED ORDER — LIDOCAINE-PRILOCAINE 2.5-2.5 % EX CREA: 1.0000 "application " | TOPICAL_CREAM | CUTANEOUS | Status: AC | PRN

## 2014-09-02 MED ORDER — LIDOCAINE-PRILOCAINE 2.5-2.5 % EX CREA: 1.0000 "application " | TOPICAL_CREAM | CUTANEOUS | Status: DC | PRN

## 2014-09-02 MED ORDER — FLUDEOXYGLUCOSE F - 18 (FDG) INJECTION
7.2000 | Freq: Once | INTRAVENOUS | Status: AC | PRN
Start: 2014-09-02 — End: 2014-09-02
  Administered 2014-09-02: 7.2 via INTRAVENOUS

## 2014-09-05 ENCOUNTER — Other Ambulatory Visit (HOSPITAL_BASED_OUTPATIENT_CLINIC_OR_DEPARTMENT_OTHER): Payer: Medicare Other

## 2014-09-05 ENCOUNTER — Other Ambulatory Visit: Payer: Medicare Other

## 2014-09-05 ENCOUNTER — Other Ambulatory Visit: Payer: Self-pay | Admitting: *Deleted

## 2014-09-05 ENCOUNTER — Ambulatory Visit (HOSPITAL_BASED_OUTPATIENT_CLINIC_OR_DEPARTMENT_OTHER): Payer: Medicare Other

## 2014-09-05 ENCOUNTER — Encounter (HOSPITAL_COMMUNITY): Payer: Self-pay | Admitting: Thoracic Surgery (Cardiothoracic Vascular Surgery)

## 2014-09-05 DIAGNOSIS — C3491 Malignant neoplasm of unspecified part of right bronchus or lung: Secondary | ICD-10-CM

## 2014-09-05 DIAGNOSIS — Z5111 Encounter for antineoplastic chemotherapy: Secondary | ICD-10-CM | POA: Diagnosis not present

## 2014-09-05 DIAGNOSIS — C774 Secondary and unspecified malignant neoplasm of inguinal and lower limb lymph nodes: Secondary | ICD-10-CM

## 2014-09-05 LAB — COMPREHENSIVE METABOLIC PANEL (CC13)
ALK PHOS: 60 U/L (ref 40–150)
ALT: <6 U/L (ref 0–55)
ANION GAP: 13 meq/L — AB (ref 3–11)
AST: 15 U/L (ref 5–34)
Albumin: 2.7 g/dL — ABNORMAL LOW (ref 3.5–5.0)
BUN: 13.6 mg/dL (ref 7.0–26.0)
CHLORIDE: 101 meq/L (ref 98–109)
CO2: 26 mEq/L (ref 22–29)
Calcium: 9.9 mg/dL (ref 8.4–10.4)
Creatinine: 0.8 mg/dL (ref 0.7–1.3)
EGFR: >90 mL/min/{1.73_m2} (ref >90)
GLUCOSE: 138 mg/dL (ref 70–140)
POTASSIUM: 3.8 meq/L (ref 3.5–5.1)
SODIUM: 140 meq/L (ref 136–145)
Total Bilirubin: 0.3 mg/dL (ref 0.20–1.20)
Total Protein: 7.5 g/dL (ref 6.4–8.3)

## 2014-09-05 LAB — CBC WITH DIFFERENTIAL/PLATELET
BASO%: 0.4 % (ref 0.0–2.0)
Basophils Absolute: 0 10*3/uL (ref 0.0–0.1)
EOS%: 0 % (ref 0.0–7.0)
Eosinophils Absolute: 0 10*3/uL (ref 0.0–0.5)
HCT: 38.6 % (ref 38.4–49.9)
HEMOGLOBIN: 12.8 g/dL — AB (ref 13.0–17.1)
LYMPH%: 9.3 % — ABNORMAL LOW (ref 14.0–49.0)
MCH: 25.8 pg — ABNORMAL LOW (ref 27.2–33.4)
MCHC: 33.1 g/dL (ref 32.0–36.0)
MCV: 78.1 fL — ABNORMAL LOW (ref 79.3–98.0)
MONO#: 0.5 10*3/uL (ref 0.1–0.9)
MONO%: 4 % (ref 0.0–14.0)
NEUT#: 11.1 10*3/uL — ABNORMAL HIGH (ref 1.5–6.5)
NEUT%: 86.3 % — ABNORMAL HIGH (ref 39.0–75.0)
Platelets: 429 10*3/uL — ABNORMAL HIGH (ref 140–400)
RBC: 4.95 10*6/uL (ref 4.20–5.82)
RDW: 16.6 % — ABNORMAL HIGH (ref 11.0–14.6)
WBC: 12.8 10*3/uL — ABNORMAL HIGH (ref 4.0–10.3)
lymph#: 1.2 10*3/uL (ref 0.9–3.3)

## 2014-09-05 MED ORDER — ONDANSETRON 16 MG/50ML IVPB (CHCC)
16.0000 mg | Freq: Once | INTRAVENOUS | Status: AC
  Administered 2014-09-05: 16 mg via INTRAVENOUS

## 2014-09-05 MED ORDER — SODIUM CHLORIDE 0.9 % IV SOLN
Freq: Once | INTRAVENOUS | Status: AC
  Administered 2014-09-05: 09:00:00 via INTRAVENOUS

## 2014-09-05 MED ORDER — ONDANSETRON 16 MG/50ML IVPB (CHCC)
INTRAVENOUS | Status: AC
  Filled 2014-09-05: qty 16

## 2014-09-05 MED ORDER — HEPARIN SOD (PORK) LOCK FLUSH 100 UNIT/ML IV SOLN
500.0000 [IU] | Freq: Once | INTRAVENOUS | Status: AC | PRN
  Administered 2014-09-05: 500 [IU]
  Filled 2014-09-05: qty 5

## 2014-09-05 MED ORDER — SODIUM CHLORIDE 0.9 % IV SOLN
452.0000 mg | Freq: Once | INTRAVENOUS | Status: AC
  Administered 2014-09-05: 450 mg via INTRAVENOUS
  Filled 2014-09-05: qty 45

## 2014-09-05 MED ORDER — DEXAMETHASONE SODIUM PHOSPHATE 20 MG/5ML IJ SOLN
20.0000 mg | Freq: Once | INTRAMUSCULAR | Status: AC
  Administered 2014-09-05: 20 mg via INTRAVENOUS

## 2014-09-05 MED ORDER — DEXAMETHASONE SODIUM PHOSPHATE 20 MG/5ML IJ SOLN
INTRAMUSCULAR | Status: AC
  Filled 2014-09-05: qty 5

## 2014-09-05 MED ORDER — SODIUM CHLORIDE 0.9 % IJ SOLN
10.0000 mL | INTRAMUSCULAR | Status: DC | PRN
  Administered 2014-09-05: 10 mL
  Filled 2014-09-05: qty 10

## 2014-09-05 MED ORDER — SODIUM CHLORIDE 0.9 % IV SOLN
500.0000 mg/m2 | Freq: Once | INTRAVENOUS | Status: AC
  Administered 2014-09-05: 925 mg via INTRAVENOUS
  Filled 2014-09-05: qty 37

## 2014-09-05 NOTE — Patient Instructions (Signed)
[facility] Discharge Instructions for Patients Receiving Chemotherapy  Today you received the following chemotherapy agents alimta/carboplatin  To help prevent nausea and vomiting after your treatment, we encourage you to take your nausea medication as directed   If you develop nausea and vomiting that is not controlled by your nausea medication, call the clinic.   BELOW ARE SYMPTOMS THAT SHOULD BE REPORTED IMMEDIATELY:  *FEVER GREATER THAN 100.5 F  *CHILLS WITH OR WITHOUT FEVER  NAUSEA AND VOMITING THAT IS NOT CONTROLLED WITH YOUR NAUSEA MEDICATION  *UNUSUAL SHORTNESS OF BREATH  *UNUSUAL BRUISING OR BLEEDING  TENDERNESS IN MOUTH AND THROAT WITH OR WITHOUT PRESENCE OF ULCERS  *URINARY PROBLEMS  *BOWEL PROBLEMS  UNUSUAL RASH Items with * indicate a potential emergency and should be followed up as soon as possible.  Feel free to call the clinic you have any questions or concerns. The clinic phone number is [phone].  Pemetrexed injection What is this medicine? PEMETREXED (PEM e TREX ed) is a chemotherapy drug. This medicine affects cells that are rapidly growing, such as cancer cells and cells in your mouth and stomach. It is usually used to treat lung cancers like non-small cell lung cancer and mesothelioma. It may also be used to treat other cancers. This medicine may be used for other purposes; ask your health care provider or pharmacist if you have questions. COMMON BRAND NAME(S): Alimta What should I tell my health care provider before I take this medicine? They need to know if you have any of these conditions: -if you frequently drink alcohol containing beverages -infection (especially a virus infection such as chickenpox, cold sores, or herpes) -kidney disease -liver disease -low blood counts, like low platelets, red bloods, or white blood cells -an unusual or allergic reaction to pemetrexed, mannitol, other medicines, foods, dyes, or  preservatives -pregnant or trying to get pregnant -breast-feeding How should I use this medicine? This drug is given as an infusion into a vein. It is administered in a hospital or clinic by a specially trained health care professional. Talk to your pediatrician regarding the use of this medicine in children. Special care may be needed. Overdosage: If you think you have taken too much of this medicine contact a poison control center or emergency room at once. NOTE: This medicine is only for you. Do not share this medicine with others. What if I miss a dose? It is important not to miss your dose. Call your doctor or health care professional if you are unable to keep an appointment. What may interact with this medicine? -aspirin and aspirin-like medicines -medicines to increase blood counts like filgrastim, pegfilgrastim, sargramostim -methotrexate -NSAIDS, medicines for pain and inflammation, like ibuprofen or naproxen -probenecid -pyrimethamine -vaccines Talk to your doctor or health care professional before taking any of these medicines: -acetaminophen -aspirin -ibuprofen -ketoprofen -naproxen This list may not describe all possible interactions. Give your health care provider a list of all the medicines, herbs, non-prescription drugs, or dietary supplements you use. Also tell them if you smoke, drink alcohol, or use illegal drugs. Some items may interact with your medicine. What should I watch for while using this medicine? Visit your doctor for checks on your progress. This drug may make you feel generally unwell. This is not uncommon, as chemotherapy can affect healthy cells as well as cancer cells. Report any side effects. Continue your course of treatment even though you feel ill unless your doctor tells you to stop. In some cases, you may be  given additional medicines to help with side effects. Follow all directions for their use. Call your doctor or health care professional for  advice if you get a fever, chills or sore throat, or other symptoms of a cold or flu. Do not treat yourself. This drug decreases your body's ability to fight infections. Try to avoid being around people who are sick. This medicine may increase your risk to bruise or bleed. Call your doctor or health care professional if you notice any unusual bleeding. Be careful brushing and flossing your teeth or using a toothpick because you may get an infection or bleed more easily. If you have any dental work done, tell your dentist you are receiving this medicine. Avoid taking products that contain aspirin, acetaminophen, ibuprofen, naproxen, or ketoprofen unless instructed by your doctor. These medicines may hide a fever. Call your doctor or health care professional if you get diarrhea or mouth sores. Do not treat yourself. To protect your kidneys, drink water or other fluids as directed while you are taking this medicine. Men and women must use effective birth control while taking this medicine. You may also need to continue using effective birth control for a time after stopping this medicine. Do not become pregnant while taking this medicine. Tell your doctor right away if you think that you or your partner might be pregnant. There is a potential for serious side effects to an unborn child. Talk to your health care professional or pharmacist for more information. Do not breast-feed an infant while taking this medicine. This medicine may lower sperm counts. What side effects may I notice from receiving this medicine? Side effects that you should report to your doctor or health care professional as soon as possible: -allergic reactions like skin rash, itching or hives, swelling of the face, lips, or tongue -low blood counts - this medicine may decrease the number of white blood cells, red blood cells and platelets. You may be at increased risk for infections and bleeding. -signs of infection - fever or chills,  cough, sore throat, pain or difficulty passing urine -signs of decreased platelets or bleeding - bruising, pinpoint red spots on the skin, black, tarry stools, blood in the urine -signs of decreased red blood cells - unusually weak or tired, fainting spells, lightheadedness -breathing problems, like a dry cough -changes in emotions or moods -chest pain -confusion -diarrhea -high blood pressure -mouth or throat sores or ulcers -pain, swelling, warmth in the leg -pain on swallowing -swelling of the ankles, feet, hands -trouble passing urine or change in the amount of urine -vomiting -yellowing of the eyes or skin Side effects that usually do not require medical attention (report to your doctor or health care professional if they continue or are bothersome): -hair loss -loss of appetite -nausea -stomach upset This list may not describe all possible side effects. Call your doctor for medical advice about side effects. You may report side effects to FDA at 1-800-FDA-1088. Where should I keep my medicine? This drug is given in a hospital or clinic and will not be stored at home. NOTE: This sheet is a summary. It may not cover all possible information. If you have questions about this medicine, talk to your doctor, pharmacist, or health care provider.  2015, [employer]. (2008-03-08 13:24:03)  Carboplatin injection What is this medicine? CARBOPLATIN (KAR boe pla tin) is a chemotherapy drug. It targets fast dividing cells, like cancer cells, and causes these cells to die. This medicine is used to treat ovarian  cancer and many other cancers. This medicine may be used for other purposes; ask your health care provider or pharmacist if you have questions. COMMON BRAND NAME(S): Paraplatin What should I tell my health care provider before I take this medicine? They need to know if you have any of these conditions: -blood disorders -hearing problems -kidney disease -recent or  ongoing radiation therapy -an unusual or allergic reaction to carboplatin, cisplatin, other chemotherapy, other medicines, foods, dyes, or preservatives -pregnant or trying to get pregnant -breast-feeding How should I use this medicine? This drug is usually given as an infusion into a vein. It is administered in a hospital or clinic by a specially trained health care professional. Talk to your pediatrician regarding the use of this medicine in children. Special care may be needed. Overdosage: If you think you have taken too much of this medicine contact a poison control center or emergency room at once. NOTE: This medicine is only for you. Do not share this medicine with others. What if I miss a dose? It is important not to miss a dose. Call your doctor or health care professional if you are unable to keep an appointment. What may interact with this medicine? -medicines for seizures -medicines to increase blood counts like filgrastim, pegfilgrastim, sargramostim -some antibiotics like amikacin, gentamicin, neomycin, streptomycin, tobramycin -vaccines Talk to your doctor or health care professional before taking any of these medicines: -acetaminophen -aspirin -ibuprofen -ketoprofen -naproxen This list may not describe all possible interactions. Give your health care provider a list of all the medicines, herbs, non-prescription drugs, or dietary supplements you use. Also tell them if you smoke, drink alcohol, or use illegal drugs. Some items may interact with your medicine. What should I watch for while using this medicine? Your condition will be monitored carefully while you are receiving this medicine. You will need important blood work done while you are taking this medicine. This drug may make you feel generally unwell. This is not uncommon, as chemotherapy can affect healthy cells as well as cancer cells. Report any side effects. Continue your course of treatment even though you feel ill  unless your doctor tells you to stop. In some cases, you may be given additional medicines to help with side effects. Follow all directions for their use. Call your doctor or health care professional for advice if you get a fever, chills or sore throat, or other symptoms of a cold or flu. Do not treat yourself. This drug decreases your body's ability to fight infections. Try to avoid being around people who are sick. This medicine may increase your risk to bruise or bleed. Call your doctor or health care professional if you notice any unusual bleeding. Be careful brushing and flossing your teeth or using a toothpick because you may get an infection or bleed more easily. If you have any dental work done, tell your dentist you are receiving this medicine. Avoid taking products that contain aspirin, acetaminophen, ibuprofen, naproxen, or ketoprofen unless instructed by your doctor. These medicines may hide a fever. Do not become pregnant while taking this medicine. Women should inform their doctor if they wish to become pregnant or think they might be pregnant. There is a potential for serious side effects to an unborn child. Talk to your health care professional or pharmacist for more information. Do not breast-feed an infant while taking this medicine. What side effects may I notice from receiving this medicine? Side effects that you should report to your doctor or health care  professional as soon as possible: -allergic reactions like skin rash, itching or hives, swelling of the face, lips, or tongue -signs of infection - fever or chills, cough, sore throat, pain or difficulty passing urine -signs of decreased platelets or bleeding - bruising, pinpoint red spots on the skin, black, tarry stools, nosebleeds -signs of decreased red blood cells - unusually weak or tired, fainting spells, lightheadedness -breathing problems -changes in hearing -changes in vision -chest pain -high blood pressure -low  blood counts - This drug may decrease the number of white blood cells, red blood cells and platelets. You may be at increased risk for infections and bleeding. -nausea and vomiting -pain, swelling, redness or irritation at the injection site -pain, tingling, numbness in the hands or feet -problems with balance, talking, walking -trouble passing urine or change in the amount of urine Side effects that usually do not require medical attention (report to your doctor or health care professional if they continue or are bothersome): -hair loss -loss of appetite -metallic taste in the mouth or changes in taste This list may not describe all possible side effects. Call your doctor for medical advice about side effects. You may report side effects to FDA at 1-800-FDA-1088. Where should I keep my medicine? This drug is given in a hospital or clinic and will not be stored at home. NOTE: This sheet is a summary. It may not cover all possible information. If you have questions about this medicine, talk to your doctor, pharmacist, or health care provider.  2015, [employer]. (2007-11-10 14:38:05)

## 2014-09-07 ENCOUNTER — Encounter (HOSPITAL_COMMUNITY): Payer: Self-pay

## 2014-09-07 ENCOUNTER — Telehealth: Payer: Self-pay | Admitting: *Deleted

## 2014-09-07 NOTE — Telephone Encounter (Signed)
Attempted to call pt for post chemo follow up unsuccessfully.  Unable to leave message due to phone just ringing for a long time, and no voice mail available.

## 2014-09-12 ENCOUNTER — Encounter: Payer: Self-pay | Admitting: *Deleted

## 2014-09-12 ENCOUNTER — Encounter: Payer: Self-pay | Admitting: Oncology

## 2014-09-12 ENCOUNTER — Telehealth: Payer: Self-pay | Admitting: Internal Medicine

## 2014-09-12 ENCOUNTER — Ambulatory Visit (HOSPITAL_BASED_OUTPATIENT_CLINIC_OR_DEPARTMENT_OTHER): Payer: Medicare Other | Admitting: Oncology

## 2014-09-12 ENCOUNTER — Other Ambulatory Visit (HOSPITAL_BASED_OUTPATIENT_CLINIC_OR_DEPARTMENT_OTHER): Payer: Medicare Other

## 2014-09-12 VITALS — BP 97/56 | HR 112 | Temp 98.8°F | Resp 18 | Ht 72.0 in | Wt 154.0 lb

## 2014-09-12 DIAGNOSIS — C3491 Malignant neoplasm of unspecified part of right bronchus or lung: Secondary | ICD-10-CM

## 2014-09-12 DIAGNOSIS — C7989 Secondary malignant neoplasm of other specified sites: Secondary | ICD-10-CM

## 2014-09-12 LAB — CBC WITH DIFFERENTIAL/PLATELET
BASO%: 0.8 % (ref 0.0–2.0)
Basophils Absolute: 0 10*3/uL (ref 0.0–0.1)
EOS ABS: 0.3 10*3/uL (ref 0.0–0.5)
EOS%: 7.5 % — ABNORMAL HIGH (ref 0.0–7.0)
HCT: 34.2 % — ABNORMAL LOW (ref 38.4–49.9)
HGB: 11.1 g/dL — ABNORMAL LOW (ref 13.0–17.1)
LYMPH#: 1.4 10*3/uL (ref 0.9–3.3)
LYMPH%: 30.3 % (ref 14.0–49.0)
MCH: 25.1 pg — AB (ref 27.2–33.4)
MCHC: 32.3 g/dL (ref 32.0–36.0)
MCV: 77.6 fL — ABNORMAL LOW (ref 79.3–98.0)
MONO#: 0.1 10*3/uL (ref 0.1–0.9)
MONO%: 3 % (ref 0.0–14.0)
NEUT%: 58.4 % (ref 39.0–75.0)
NEUTROS ABS: 2.7 10*3/uL (ref 1.5–6.5)
Platelets: 222 10*3/uL (ref 140–400)
RBC: 4.41 10*6/uL (ref 4.20–5.82)
RDW: 16.4 % — ABNORMAL HIGH (ref 11.0–14.6)
WBC: 4.6 10*3/uL (ref 4.0–10.3)

## 2014-09-12 LAB — COMPREHENSIVE METABOLIC PANEL (CC13)
ALT: 18 U/L (ref 0–55)
ANION GAP: 11 meq/L (ref 3–11)
AST: 20 U/L (ref 5–34)
Albumin: 2.8 g/dL — ABNORMAL LOW (ref 3.5–5.0)
Alkaline Phosphatase: 64 U/L (ref 40–150)
BUN: 9.7 mg/dL (ref 7.0–26.0)
CO2: 28 mEq/L (ref 22–29)
Calcium: 9.3 mg/dL (ref 8.4–10.4)
Chloride: 95 mEq/L — ABNORMAL LOW (ref 98–109)
Creatinine: 0.7 mg/dL (ref 0.7–1.3)
EGFR: >90 mL/min/{1.73_m2} (ref >90)
Glucose: 96 mg/dl (ref 70–140)
Potassium: 4 mEq/L (ref 3.5–5.1)
Sodium: 134 mEq/L — ABNORMAL LOW (ref 136–145)
Total Bilirubin: 0.91 mg/dL (ref 0.20–1.20)
Total Protein: 6.9 g/dL (ref 6.4–8.3)

## 2014-09-12 NOTE — [facility] Oncology Navigator Note (Unsigned)
I spoke with patient today at [facility].  I asked how he was doing and he stated ok.  He did not have much to say today but I did listen to him as he spoke about how he was feeling today.  I did ask him how he gets to the cancer center for treatment.  He stated his brother brings him.  I asked him if he would like to apply for gas cards.  He stated yes.  I helped patient fill out Lung cancer initiative and [employer] Applications for gas cards.  He was thankful for the help.  I asked if he would like me to help him with SC[employer] application incase brother could not bring him to cancer center.  He stated no, his brother will bring him.    I also asked how his smoking cessation was going.  He stated he is still smoking and doesn't have plan on quitting at this time.  I asked that if he would like help with smoking cessation, to contact me at the cancer center.

## 2014-09-12 NOTE — Progress Notes (Signed)
[facility] [phone]) [phone]   [phone]   OFFICE PROGRESS NOTE  REFERRING PHYSICIAN: Dr. Vernell Leep  DIAGNOSIS: stage IV (T3, N2, M1 B) non-small cell lung cancer, adenocarcinoma presented with large right lung mass in addition to mediastinal lymphadenopathy and metastatic lesion in the right thigh diagnosed in December 2015.  CURRENT THERAPY: Systemic chemotherapy with carboplatin AUC of 5 and Alimta 500 mg/m started on 09/05/2014.  HPI Brendan Hooper is a 69 y.o. male is seen for routine follow-up to assess toxicities following his first cycle chemotherapy. States that he tolerated his chemotherapy well. He reports that his appetite has improved. He has gained about 8 pounds since his last visit with [place]. When seen today he denied having any significant chest pain, shortness of breath but continues to have cough with no hemoptysis. The patient has occasional headache but no visual changes. He denied having any significant nausea, vomiting or change in his bowel movement. Reports right rib pain and uses oxycodone. Pain is worse at night. Takes 1-2 oxycodone per day. States that he has been taking his folic acid as directed.  HPI  Past Medical History  Diagnosis Date  . Hypertension   . GERD (gastroesophageal reflux disease)   . Headache(784.0)   . Sickle cell anemia   . 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 Lung mass     Past Surgical History  Procedure Laterality Date  . No past surgeries    . Portacath placement Left 09/01/2014    Procedure: INSERTION PORT-A-CATH;  Surgeon: Melrose Nakayama, MD;  Location: [facility] OR;  Service: Thoracic;  Laterality: Left;    Family History  Problem Relation Age of Onset  . Cancer Mother   . Stroke Father     Social History History  Substance Use Topics  . Smoking status: Current Every Day Smoker -- 0.25 packs/day for 40 years    Types: Cigarettes  . Smokeless tobacco: Never Used  . Alcohol  Use: 0.0 oz/week    0 Not specified per week     Comment: social    No Known Allergies  Current Outpatient Prescriptions  Medication Sig Dispense Refill  . acetaminophen (TYLENOL) 325 MG tablet Take 2 tablets (650 mg total) by mouth every 6 (six) hours as needed for mild pain, moderate pain or fever.    Marland Kitchen dexamethasone (DECADRON) 4 MG tablet 4 mg by mouth twice a day the day before, day of and day after the chemotherapy every 3 weeks (Patient not taking: Reported on 09/01/2014) 40 tablet 1  . folic acid (FOLVITE) 1 MG tablet Take 1 tablet (1 mg total) by mouth daily. 30 tablet 0  . lidocaine-prilocaine (EMLA) cream Apply 1 application topically as needed. 30 g 1  . Multiple Vitamin (MULTIVITAMIN WITH MINERALS) TABS tablet Take 1 tablet by mouth daily. 30 tablet 0  . nicotine (NICODERM CQ - DOSED IN MG/24 HOURS) 21 mg/24hr patch Place 1 patch (21 mg total) onto the skin daily. 28 patch 0  . oxyCODONE (OXY IR/ROXICODONE) 5 MG immediate release tablet Take 1-2 tablets (5-10 mg total) by mouth every 4 (four) hours as needed for moderate pain. 30 tablet 0  . prochlorperazine (COMPAZINE) 10 MG tablet Take 1 tablet (10 mg total) by mouth every 6 (six) hours as needed for nausea or vomiting. (Patient not taking: Reported on 09/01/2014) 60 tablet 0  . thiamine 100 MG tablet Take 1 tablet (100 mg total) by mouth daily. (Patient not taking:  Reported on 09/01/2014) 30 tablet 0   No current facility-administered medications for this visit.    Review of Systems  Constitutional: positive for fatigue Eyes: negative Ears, nose, mouth, throat, and face: negative Respiratory: positive for cough Cardiovascular: negative Gastrointestinal: negative Genitourinary:negative Integument/breast: negative Hematologic/lymphatic: negative Musculoskeletal:positive for bone pain Neurological: negative Behavioral/Psych: negative Endocrine: negative Allergic/Immunologic: negative  Physical Exam  BOF:BPZWC,  healthy, no distress, well nourished and well developed SKIN: skin color, texture, turgor are normal, no rashes or significant lesions HEAD: Normocephalic, No masses, lesions, tenderness or abnormalities EYES: normal, PERRLA, Conjunctiva are pink and non-injected EARS: External ears normal, Canals clear OROPHARYNX:no exudate, no erythema and lips, buccal mucosa, and tongue normal  NECK: supple, no adenopathy, no JVD LYMPH:  no palpable lymphadenopathy, no hepatosplenomegaly LUNGS: clear to auscultation , and palpation HEART: regular rate & rhythm and no murmurs ABDOMEN:abdomen soft, non-tender, normal bowel sounds and no masses or organomegaly BACK: Back symmetric, no curvature., No CVA tenderness EXTREMITIES:no joint deformities, effusion, or inflammation, no edema, no skin discoloration  NEURO: alert & oriented x 3 with fluent speech, no focal motor/sensory deficits  PERFORMANCE STATUS: ECOG 1  LABORATORY DATA: Lab Results  Component Value Date   WBC 4.6 09/12/2014   HGB 11.1* 09/12/2014   HCT 34.2* 09/12/2014   MCV 77.6* 09/12/2014   PLT 222 09/12/2014      Chemistry      Component Value Date/Time   NA 140 09/05/2014 0823   NA 141 09/01/2014 0639   K 3.8 09/05/2014 0823   K 5.0 09/01/2014 0639   CL 101 09/01/2014 0639   CO2 26 09/05/2014 0823   CO2 25 09/01/2014 0639   BUN 13.6 09/05/2014 0823   BUN 6 09/01/2014 0639   CREATININE 0.8 09/05/2014 0823   CREATININE 0.80 09/01/2014 0639      Component Value Date/Time   CALCIUM 9.9 09/05/2014 0823   CALCIUM 9.9 09/01/2014 0639   ALKPHOS 60 09/05/2014 0823   ALKPHOS 57 09/01/2014 0639   AST 15 09/05/2014 0823   AST 23 09/01/2014 0639   ALT <6 09/05/2014 0823   ALT 10 09/01/2014 0639   BILITOT 0.30 09/05/2014 0823   BILITOT 0.6 09/01/2014 0639       RADIOGRAPHIC STUDIES: Dg Chest 2 View  09/01/2014   CLINICAL DATA:  Non-small cell lung cancer. Preoperative radiograph for today.  EXAM: CHEST  2 VIEW  COMPARISON:   Two-view chest x-ray 08/06/2014.  FINDINGS: The heart size is normal. Consolidation of the right middle lobe is again noted. Mass like expansion beyond the metal lobe likely corresponds to a neoplasm. There is little change since the previous radiographs. The left lung is clear. The visualized soft tissues and bony thorax are unremarkable.  IMPRESSION: 1. Similar appearance of right perihilar mass involving the inferior aspect of the right upper lobe and consolidation of the right middle lobe. 2. The left lung is clear.   Electronically Signed   By: Lawrence Santiago M.D.   On: 09/01/2014 07:08   Nm Pet Image Initial (pi) Skull Base To Thigh  09/02/2014   CLINICAL DATA:  Initial treatment strategy for Lung cancer.  EXAM: NUCLEAR MEDICINE PET SKULL BASE TO THIGH  TECHNIQUE: 7.2 mCi F-18 FDG was injected intravenously. Full-ring PET imaging was performed from the skull base to thigh after the radiotracer. CT data was obtained and used for attenuation correction and anatomic localization.  FASTING BLOOD GLUCOSE:  Value: 104 mg/dl  COMPARISON:  None.  FINDINGS: NECK  No hypermetabolic lymph nodes in the neck.  CHEST  Right upper lobe mass measures 10.2 x 9.3 x 9.1 cm and has an SUV max equal to 9.8. There is invasion into right hilar region and the mediastinum. There is associated postobstructive atelectasis involving the right middle lobe. A small right pleural effusion is identified. Evidence of lymphangitic spread of tumor within the right upper lobe is noted. Bilateral mediastinal and hilar adenopathy identified. Index sub- carinal lymph node measure 4.2 cm and has an SUV max equal to 10.3. SUV max within the left hilar region is equal to 3.7.  ABDOMEN/PELVIS  Right hepatic lobe mass is hypermetabolic measuring 4 cm. This has an SUV max equal to 11.7. Bilateral hypermetabolic adrenal gland metastasis are identified. SUV max associated with the 2.7 cm right adrenal gland nodule is equal to 9.2. Normal appearance of  the pancreas and spleen. Cysts are noted within the right kidney. Nonobstructing stone within the left kidney noted. Within the right inguinal region there is a hypermetabolic lymph node measuring 3.3 cm. This has an SUV max equal to 7.3.  SKELETON  Hypermetabolic lesion within the right acetabulum measures approximately 1.2 cm and has an SUV max equal to 4.5.  IMPRESSION: 1. Right upper lobe lung mass is intensely hypermetabolic compatible with primary bronchogenic carcinoma. There is evidence of bilateral mediastinal invasion with bilateral hilar and mediastinal adenopathy. Postobstructive atelectasis of the right middle lobe is present. There is nodal metastasis to the right inguinal region. Liver, adrenal and bone metastasis are present. Assuming non-small cell lung cancer this would be consistent with F2T2K4Q or Stage IV disease.   Electronically Signed   By: Kerby Moors M.D.   On: 09/02/2014 09:30   Dg Fluoro Guide Cv Line-no Report  09/01/2014   CLINICAL DATA:    FLOURO GUIDE CV LINE  Fluoroscopy was utilized by the requesting physician.  No radiographic  interpretation.     ASSESSMENT/PLAN: This is a very pleasant 69 years old African-American male recently diagnosed with stage IV (T3, N2, M1 B) non-small cell lung cancer, adenocarcinoma presented with large right lung mass in addition to mediastinal lymphadenopathy and metastatic lesion in the right thigh diagnosed in December 2015. Foundation one results demonstrate positive for K-ras G12C, STK11 loss exons 2-4 NFKBIA amplification, NKX2-1 amplification, & TP53 K291*, K863O and negative for ALK, EGFR, BRAF, MET, RET, ERBB2.  The patient was seen and examined with Dr. [name]. He has reviewed the PET scan result which is consistent with stage IV lung cancer. He is noted to have liver, adrenal, and bone metastasis. MRI of the brain is pending and will be done on 09/13/2014.  Dr. [name] has explained to the patient that he has incurable condition  in order the treatment will be off palliative nature.  The patient is status post one cycle of carboplatin AUC of 5 and Alimta 500 MG/M2. He has tolerated this well so far. We have again reminded him to take his folic acid on a daily basis. I have confirmed with him that he does have Compazine at home.  The patient will return in 2 weeks prior to cycle 2 of his chemotherapy.  The patient was advised to call immediately if he has any concerning symptoms in the interval. All questions were answered. The patient knows to call the clinic with any problems, questions or concerns. We can certainly see the patient much sooner if necessary.   Mikey Bussing 09/12/2014, 11:13 AM  ADDENDUM: Hematology/Oncology Attending: I had  a face to face encounter with the patient today. I recommended his care plan. This is a very pleasant 69 years old African-American male recently diagnosed with stage IV non-small cell lung cancer with negative EGFR mutation and negative for gene translocation. He is currently undergoing systemic chemotherapy was carboplatin and Alimta status post 1 cycle. He tolerated the first cycle of his treatment fairly well with no significant adverse effects. I recommended for him to continue his treatment as scheduled. The patient would come back for follow-up visit in 2 weeks with the start of cycle #2. He was advised to call immediately if he has any concerning symptoms in the interval.  Disclaimer: This note was dictated with voice recognition software. Similar sounding words can inadvertently be transcribed and may be missed upon review. Eilleen Kempf., MD 09/12/2014

## 2014-09-12 NOTE — Telephone Encounter (Signed)
gv and printed appt sched and avs for pt for Feb....sed added tx.

## 2014-09-13 ENCOUNTER — Ambulatory Visit (HOSPITAL_COMMUNITY)
Admission: RE | Admit: 2014-09-13 | Discharge: 2014-09-13 | Disposition: A | Discharge status: Home / Self Care | Payer: Medicare Other | Source: Ambulatory Visit | Attending: Internal Medicine | Admitting: Internal Medicine

## 2014-09-13 ENCOUNTER — Other Ambulatory Visit: Payer: Self-pay | Admitting: Internal Medicine

## 2014-09-13 ENCOUNTER — Other Ambulatory Visit: Payer: Self-pay | Admitting: Nurse Practitioner

## 2014-09-13 DIAGNOSIS — C3491 Malignant neoplasm of unspecified part of right bronchus or lung: Secondary | ICD-10-CM | POA: Diagnosis present

## 2014-09-13 MED ORDER — DEXAMETHASONE 4 MG PO TABS: 4.0000 mg | ORAL_TABLET | Freq: Four times a day (QID) | ORAL | Status: DC

## 2014-09-13 MED ORDER — OXYCODONE HCL 5 MG PO TABS: 5.0000 mg | ORAL_TABLET | ORAL | Status: DC | PRN

## 2014-09-13 MED ORDER — GADOBENATE DIMEGLUMINE 529 MG/ML IV SOLN
14.0000 mL | Freq: Once | INTRAVENOUS | Status: AC | PRN
  Administered 2014-09-13: 14 mL via INTRAVENOUS

## 2014-09-13 NOTE — Progress Notes (Signed)
Quick Note:  I tried to Call patient with the result but unfortunately I received no answer. We will need to call him back to inform him about the results of the MRI of the brain. I sent a prescription for Decadron 4 mg by mouth every 6 hours to his pharmacy. I also made an urgent referral to radiation oncology. ______

## 2014-09-14 ENCOUNTER — Telehealth: Payer: Self-pay

## 2014-09-14 ENCOUNTER — Telehealth: Payer: Self-pay | Admitting: Medical Oncology

## 2014-09-14 DIAGNOSIS — C3491 Malignant neoplasm of unspecified part of right bronchus or lung: Secondary | ICD-10-CM

## 2014-09-14 DIAGNOSIS — C7931 Secondary malignant neoplasm of brain: Secondary | ICD-10-CM

## 2014-09-14 MED ORDER — DEXAMETHASONE 4 MG PO TABS: 4.0000 mg | ORAL_TABLET | Freq: Four times a day (QID) | ORAL | Status: DC

## 2014-09-14 NOTE — Telephone Encounter (Signed)
Called and informed Vaughan Basta, pt daughter. Appt was made with Dr. Tammi Klippel on 09/19/14 and lab same day at 1000. Vaughan Basta verbalized understanding.

## 2014-09-14 NOTE — Telephone Encounter (Signed)
Radiation scheduler contacted and i gave her information on calling pts sister re appts.

## 2014-09-14 NOTE — Telephone Encounter (Signed)
Unable to reach pt on his phone or [facility] number. I called Vaughan Basta back and she will try to contact pt to call me  . Decadron rx rerouted to [facility] near pt.

## 2014-09-14 NOTE — Telephone Encounter (Signed)
I was able to contact pt at [facility] and told about his MRI result. I also told him to pick up decadron today and start it and about referral to radiation oncology.

## 2014-09-14 NOTE — Telephone Encounter (Signed)
-----   Message from Curt Bears, MD sent at 09/13/2014  7:23 PM EST ----- I tried to Call patient with the result but unfortunately I received no answer. We will need to call him back to inform him about the results of the MRI of the brain. I sent a prescription for Decadron 4 mg by mouth every 6 hours to his pharmacy. I also made an urgent referral to radiation oncology.

## 2014-09-16 ENCOUNTER — Encounter: Payer: Self-pay | Admitting: Radiation Oncology

## 2014-09-16 NOTE — Progress Notes (Signed)
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 There is no associated hydrocephalus.  Patient presented with symptoms of: occasional headaches  Past or anticipated interventions, if any, per neurosurgery: no  Past or anticipated interventions, if any, per medical oncology:  He is currently undergoing systemic chemotherapy was carboplatin and Alimta status post 1 cycle. He tolerated the first cycle of his treatment fairly well with no significant adverse effects.  I recommended for him to continue his treatment as scheduled. The patient would come back for follow-up visit in 2 weeks with the start of cycle #2.   Dose of Decadron, if applicable: Decadron 4 mg every six hours  Recent neurologic symptoms, if any:   Seizures: no  Headaches: yes  Nausea: no  Dizziness/ataxia: no  Difficulty with hand coordination: no  Focal numbness/weakness: no  Visual deficits/changes: no  Confusion/Memory deficits: no  Painful bone metastases at present, if any: yes  SAFETY ISSUES:  Prior radiation? no  Pacemaker/ICD? no  Possible current pregnancy? no  Is the patient on methotrexate? no  Additional Complaints / other details: 69 year old male. Reports right rib pain for which he uses oxycodone; pain worse at night. Also, reports fatigue. NKDA. NO hx of XRT. Denies having a pacemaker. Lesions found on MRI done 09/13/2014

## 2014-09-18 NOTE — Progress Notes (Signed)
Radiation Oncology         [phone] ________________________________  Initial outpatient Consultation  Name: Brendan Hooper  MRN: [id number]  Date: 09/19/2014  DOB: [birth year]  IF:OYDXAJ, Brendan Prey, MD  Brendan Jewel, MD   REFERRING PHYSICIAN: Antonietta Jewel, MD  DIAGNOSIS: The encounter diagnosis was Bronchogenic cancer of right lung.    ICD-9-CM ICD-10-CM   1. Bronchogenic cancer of right lung 162.9 C34.91     HISTORY OF PRESENT ILLNESS::Brendan Hooper is a 69 y.o. male who is was recently hospitalized for post-obstructive pneumonia of the right lung. He has a large right lung mass, subcarinal adenopathy, a liver metastasis, right adrenal metastasis, and he also had right inguinal adenopathy.     Needle biopsy of the right inguinal node was positive for metastatic non-small cell lung cancer. Brain MRI shows at least 9 brain metastases.   He has kindly been referred today to discuss possible radiotherapy.  PREVIOUS RADIATION THERAPY: No  PAST MEDICAL HISTORY:  has a past medical history of Hypertension; GERD (gastroesophageal reflux disease); Headache(784.0); Sickle cell anemia; Bronchogenic cancer of right lung (08/25/2014); Lung mass; and Brain cancer.    PAST SURGICAL HISTORY: Past Surgical History  Procedure Laterality Date  . No past surgeries    . Portacath placement Left 09/01/2014    Procedure: INSERTION PORT-A-CATH;  Surgeon: Melrose Nakayama, MD;  Location: [facility];  Service: Thoracic;  Laterality: Left;    FAMILY HISTORY: family history includes Cancer in his mother; Stroke in his father.  SOCIAL HISTORY:  reports that he has been smoking Cigarettes.  He has a 10 pack-year smoking history. He has never used smokeless tobacco. He reports that he drinks alcohol. He reports that he does not use illicit drugs.  ALLERGIES: Review of patient's allergies indicates no known allergies.  MEDICATIONS:  Current Outpatient Prescriptions  Medication Sig Dispense Refill  .  dexamethasone (DECADRON) 4 MG tablet 4 mg by mouth twice a day the day before, day of and day after the chemotherapy every 3 weeks 40 tablet 1  . dexamethasone (DECADRON) 4 MG tablet Take 1 tablet (4 mg total) by mouth 4 (four) times daily. 40 tablet 0  . folic acid (FOLVITE) 1 MG tablet Take 1 tablet (1 mg total) by mouth daily. 30 tablet 0  . lidocaine-prilocaine (EMLA) cream Apply 1 application topically as needed. 30 g 1  . Multiple Vitamin (MULTIVITAMIN WITH MINERALS) TABS tablet Take 1 tablet by mouth daily. 30 tablet 0  . nicotine (NICODERM CQ - DOSED IN MG/24 HOURS) 21 mg/24hr patch Place 1 patch (21 mg total) onto the skin daily. 28 patch 0  . oxyCODONE (OXY IR/ROXICODONE) 5 MG immediate release tablet Take 1-2 tablets (5-10 mg total) by mouth every 4 (four) hours as needed for moderate pain. 45 tablet 0  . thiamine 100 MG tablet Take 1 tablet (100 mg total) by mouth daily. 30 tablet 0  . prochlorperazine (COMPAZINE) 10 MG tablet Take 1 tablet (10 mg total) by mouth every 6 (six) hours as needed for nausea or vomiting. (Patient not taking: Reported on 09/19/2014) 60 tablet 0   No current facility-administered medications for this encounter.    REVIEW OF SYSTEMS:  A 15 point review of systems is documented in the electronic medical record. This was obtained by the nursing staff. However, I reviewed this with the patient to discuss relevant findings and make appropriate changes.  The patient does not seem to have many neurologic symptoms. He does have a cough producing  sputum with occasional blood.   PHYSICAL EXAM:  height is 6' (1.829 m) and weight is 154 lb 1.6 oz (69.899 kg). His blood pressure is 128/81 and his pulse is 86. His respiration is 16 and oxygen saturation is 95%.   per medical oncology:   RSW:NIOEV, healthy, no distress, well nourished and well developed SKIN: skin color, texture, turgor are normal, no rashes or significant lesions HEAD: Normocephalic, No masses, lesions,  tenderness or abnormalities EYES: normal, PERRLA, Conjunctiva are pink and non-injected EARS: External ears normal, Canals clear OROPHARYNX:no exudate, no erythema and lips, buccal mucosa, and tongue normal  NECK: supple, no adenopathy, no JVD LYMPH: no palpable lymphadenopathy, no hepatosplenomegaly LUNGS: clear to auscultation , and palpation HEART: regular rate & rhythm and no murmurs ABDOMEN:abdomen soft, non-tender, normal bowel sounds and no masses or organomegaly BACK: Back symmetric, no curvature., No CVA tenderness EXTREMITIES:no joint deformities, effusion, or inflammation, no edema, no skin discoloration  NEURO: alert & oriented x 3 with fluent speech, no focal motor/sensory deficits  KPS = 70  100 - Normal; no complaints; no evidence of disease. 90   - Able to carry on normal activity; minor signs or symptoms of disease. 80   - Normal activity with effort; some signs or symptoms of disease. 23   - Cares for self; unable to carry on normal activity or to do active work. 60   - Requires occasional assistance, but is able to care for most of his personal needs. 50   - Requires considerable assistance and frequent medical care. 45   - Disabled; requires special care and assistance. 42   - Severely disabled; hospital admission is indicated although death not imminent. 51   - Very sick; hospital admission necessary; active supportive treatment necessary. 10   - Moribund; fatal processes progressing rapidly. 0     - Dead  Karnofsky DA, Abelmann [place], Craver LS and Burchenal JH [phone]) The use of the nitrogen mustards in the palliative treatment of carcinoma: with particular reference to bronchogenic carcinoma Cancer 1 634-56  LABORATORY DATA:  Lab Results  Component Value Date   WBC 8.6 09/19/2014   HGB 11.1* 09/19/2014   HCT 32.2* 09/19/2014   MCV 75.1* 09/19/2014   PLT 320 09/19/2014   Lab Results  Component Value Date   NA 134* 09/12/2014   K 4.0 09/12/2014   CL 101 09/01/2014     CO2 28 09/12/2014   Lab Results  Component Value Date   ALT 18 09/12/2014   AST 20 09/12/2014   ALKPHOS 64 09/12/2014   BILITOT 0.91 09/12/2014     RADIOGRAPHY: Dg Chest 2 View  09/01/2014   CLINICAL DATA:  Non-small cell lung cancer. Preoperative radiograph for today.  EXAM: CHEST  2 VIEW  COMPARISON:  Two-view chest x-ray 08/06/2014.  FINDINGS: The heart size is normal. Consolidation of the right middle lobe is again noted. Mass like expansion beyond the metal lobe likely corresponds to a neoplasm. There is little change since the previous radiographs. The left lung is clear. The visualized soft tissues and bony thorax are unremarkable.  IMPRESSION: 1. Similar appearance of right perihilar mass involving the inferior aspect of the right upper lobe and consolidation of the right middle lobe. 2. The left lung is clear.   Electronically Signed   By: Lawrence Santiago M.D.   On: 09/01/2014 07:08   Mr Jeri Cos KX Contrast  09/13/2014   CLINICAL DATA:  Lung cancer.  Primary bronchogenic carcinoma.  EXAM: MRI HEAD WITHOUT AND WITH CONTRAST  TECHNIQUE: Multiplanar, multiecho pulse sequences of the brain and surrounding structures were obtained without and with intravenous contrast.  CONTRAST:  41mL MULTIHANCE GADOBENATE DIMEGLUMINE 529 MG/ML IV SOLN  COMPARISON:  None.  FINDINGS: Three separate peripherally enhancing lesions are present in the right cerebellar hemisphere. The largest lesion measures 12 x 14 mm. Smaller lesion measures 7 and 10 mm respectively. There are 3 scratch the there 2 small lesions within the pons measuring 3 mm each. A 2 mm lesion is present in the left cerebral hemisphere.  A left occipital lobe peripherally enhancing lesion measures 9 x 8 x 8 mm. An anterior inferior left frontal lobe lesion measures 9 x 9 x 10 mm. The largest lesion is centered within the third ventricle measuring 13 x 17 x 13 mm.  There is no hydrocephalus. Additional periventricular and subcortical T2  hyperintensities bilaterally are greater than expected for age. There is no enhancement with these other lesions.  Flow is present in the major intracranial arteries. Globes and orbits are intact.  Mild mucosal thickening is scattered throughout the ethmoid air cells. There is some fluid in the mastoid air cells bilaterally. No enhancement or obstructing nasopharyngeal lesion is present.  IMPRESSION: 1. Multiple brain metastases involving the right greater than left cerebellar hemisphere, of the pons, the left occipital lobe, and the left frontal lobe. 2. The largest enhancing lesion is centered in the third ventricle, also likely representing a focal metastasis measuring 13 x 17 x 13 mm. There is no associated hydrocephalus. 3. Scattered periventricular and subcortical T2 hyperintensities are present in addition to the lesions noted. These likely reflect the sequela of chronic microvascular ischemia.   Electronically Signed   By: Lawrence Santiago M.D.   On: 09/13/2014 12:03   Nm Pet Image Initial (pi) Skull Base To Thigh  09/02/2014   CLINICAL DATA:  Initial treatment strategy for Lung cancer.  EXAM: NUCLEAR MEDICINE PET SKULL BASE TO THIGH  TECHNIQUE: 7.2 mCi F-18 FDG was injected intravenously. Full-ring PET imaging was performed from the skull base to thigh after the radiotracer. CT data was obtained and used for attenuation correction and anatomic localization.  FASTING BLOOD GLUCOSE:  Value: 104 mg/dl  COMPARISON:  None.  FINDINGS: NECK  No hypermetabolic lymph nodes in the neck.  CHEST  Right upper lobe mass measures 10.2 x 9.3 x 9.1 cm and has an SUV max equal to 9.8. There is invasion into right hilar region and the mediastinum. There is associated postobstructive atelectasis involving the right middle lobe. A small right pleural effusion is identified. Evidence of lymphangitic spread of tumor within the right upper lobe is noted. Bilateral mediastinal and hilar adenopathy identified. Index sub- carinal  lymph node measure 4.2 cm and has an SUV max equal to 10.3. SUV max within the left hilar region is equal to 3.7.  ABDOMEN/PELVIS  Right hepatic lobe mass is hypermetabolic measuring 4 cm. This has an SUV max equal to 11.7. Bilateral hypermetabolic adrenal gland metastasis are identified. SUV max associated with the 2.7 cm right adrenal gland nodule is equal to 9.2. Normal appearance of the pancreas and spleen. Cysts are noted within the right kidney. Nonobstructing stone within the left kidney noted. Within the right inguinal region there is a hypermetabolic lymph node measuring 3.3 cm. This has an SUV max equal to 7.3.  SKELETON  Hypermetabolic lesion within the right acetabulum measures approximately 1.2 cm and has an SUV max equal to  4.5.  IMPRESSION: 1. Right upper lobe lung mass is intensely hypermetabolic compatible with primary bronchogenic carcinoma. There is evidence of bilateral mediastinal invasion with bilateral hilar and mediastinal adenopathy. Postobstructive atelectasis of the right middle lobe is present. There is nodal metastasis to the right inguinal region. Liver, adrenal and bone metastasis are present. Assuming non-small cell lung cancer this would be consistent with B2I2M3T or Stage IV disease.   Electronically Signed   By: Kerby Moors M.D.   On: 09/02/2014 09:30   Dg Fluoro Guide Cv Line-no Report  09/01/2014   CLINICAL DATA:    FLOURO GUIDE CV LINE  Fluoroscopy was utilized by the requesting physician.  No radiographic  interpretation.    IMPRESSION: 69 yo man with numerous brain metastases from metastatic lung cancer. He would benefit from whole brain irradiation. In addition, due to his large lung mass, postobstructive pneumonia, cough and trace hemoptysis, I would also recommend consideration for thoracic radiotherapy using a palliative regimen.  PLAN:Today, I talked to the patient and family about the findings and work-up thus far.  We discussed the natural history of brain  metastases and general treatment, highlighting the role of radiotherapy in the management.  We discussed the available radiation techniques, and focused on the details of logistics and delivery.  We reviewed the anticipated acute and late sequelae associated with radiation in this setting.  The patient was encouraged to ask questions that I answered to the best of my ability.  I filled out a patient counseling form during our discussion including treatment diagrams.  We retained a copy for our records.  The patient would like to proceed with radiation and will be scheduled for CT simulation.  At this point, I would tentatively plan to start radiotherapy to the whole brain and chest tomorrow delivering a total of 14 fractions.  I spent 60 minutes minutes face to face with the patient and more than 50% of that time was spent in counseling and/or coordination of care.    ------------------------------------------------  Sheral Apley. Tammi Klippel, M.D.

## 2014-09-19 ENCOUNTER — Ambulatory Visit
Admission: RE | Admit: 2014-09-19 | Discharge: 2014-09-19 | Disposition: A | Discharge status: Home / Self Care | Payer: Medicare Other | Source: Ambulatory Visit | Attending: Radiation Oncology | Admitting: Radiation Oncology

## 2014-09-19 ENCOUNTER — Other Ambulatory Visit (HOSPITAL_BASED_OUTPATIENT_CLINIC_OR_DEPARTMENT_OTHER): Payer: Medicare Other

## 2014-09-19 ENCOUNTER — Encounter: Payer: Self-pay | Admitting: Radiation Oncology

## 2014-09-19 ENCOUNTER — Telehealth: Payer: Self-pay | Admitting: Internal Medicine

## 2014-09-19 ENCOUNTER — Encounter: Payer: Self-pay | Admitting: *Deleted

## 2014-09-19 ENCOUNTER — Telehealth: Payer: Self-pay | Admitting: Medical Oncology

## 2014-09-19 VITALS — BP 128/81 | HR 86 | Resp 16 | Ht 72.0 in | Wt 154.1 lb

## 2014-09-19 DIAGNOSIS — Z720 Tobacco use: Secondary | ICD-10-CM | POA: Diagnosis not present

## 2014-09-19 DIAGNOSIS — C7931 Secondary malignant neoplasm of brain: Secondary | ICD-10-CM | POA: Insufficient documentation

## 2014-09-19 DIAGNOSIS — C3491 Malignant neoplasm of unspecified part of right bronchus or lung: Secondary | ICD-10-CM

## 2014-09-19 LAB — COMPREHENSIVE METABOLIC PANEL (CC13)
ALBUMIN: 2.6 g/dL — AB (ref 3.5–5.0)
ALK PHOS: 69 U/L (ref 40–150)
ALT: 18 U/L (ref 0–55)
AST: 26 U/L (ref 5–34)
Anion Gap: 12 mEq/L — ABNORMAL HIGH (ref 3–11)
BUN: 13.7 mg/dL (ref 7.0–26.0)
CALCIUM: 9.2 mg/dL (ref 8.4–10.4)
CO2: 27 mEq/L (ref 22–29)
Chloride: 101 mEq/L (ref 98–109)
Creatinine: 0.6 mg/dL — ABNORMAL LOW (ref 0.7–1.3)
EGFR: >90 mL/min/{1.73_m2} (ref >90)
Glucose: 126 mg/dl (ref 70–140)
Potassium: 3.8 mEq/L (ref 3.5–5.1)
Sodium: 140 mEq/L (ref 136–145)
TOTAL PROTEIN: 6.5 g/dL (ref 6.4–8.3)
Total Bilirubin: 0.32 mg/dL (ref 0.20–1.20)

## 2014-09-19 LAB — CBC WITH DIFFERENTIAL/PLATELET
BASO%: 0.1 % (ref 0.0–2.0)
Basophils Absolute: 0 10*3/uL (ref 0.0–0.1)
EOS%: 0 % (ref 0.0–7.0)
Eosinophils Absolute: 0 10*3/uL (ref 0.0–0.5)
HEMATOCRIT: 32.2 % — AB (ref 38.4–49.9)
HEMOGLOBIN: 11.1 g/dL — AB (ref 13.0–17.1)
LYMPH%: 14.3 % (ref 14.0–49.0)
MCH: 25.9 pg — ABNORMAL LOW (ref 27.2–33.4)
MCHC: 34.5 g/dL (ref 32.0–36.0)
MCV: 75.1 fL — ABNORMAL LOW (ref 79.3–98.0)
MONO#: 0.6 10*3/uL (ref 0.1–0.9)
MONO%: 7.1 % (ref 0.0–14.0)
NEUT%: 78.5 % — ABNORMAL HIGH (ref 39.0–75.0)
NEUTROS ABS: 6.7 10*3/uL — AB (ref 1.5–6.5)
PLATELETS: 320 10*3/uL (ref 140–400)
RBC: 4.29 10*6/uL (ref 4.20–5.82)
RDW: 16.1 % — ABNORMAL HIGH (ref 11.0–14.6)
WBC: 8.6 10*3/uL (ref 4.0–10.3)
lymph#: 1.2 10*3/uL (ref 0.9–3.3)

## 2014-09-19 NOTE — Addendum Note (Signed)
Encounter addended by: Lora Paula, MD on: 09/19/2014  7:43 PM<BR>     Documentation filed: Follow-up Section, LOS Section

## 2014-09-19 NOTE — Addendum Note (Signed)
Encounter addended by: Lora Paula, MD on: 09/19/2014 11:54 AM<BR>     Documentation filed: Follow-up Section, LOS Section

## 2014-09-19 NOTE — Telephone Encounter (Signed)
PER MOHAMED HE IS NOT GOING TO GIVE ANY CHEMOTHERAPY WHILE PT GETTING RADIATION. I INFORMED [name] AND SENT ONC TX REQUEUST TO [facility],CHEMO APPT.

## 2014-09-19 NOTE — [facility] Oncology Navigator Note (Unsigned)
Spoke to patient before his office visit with Dr. Tammi Klippel.  Patient is with his sister.  I have not met the sister so I introduced myself and tell her my role.  I listened to the patient and family as they spoke about how patient is doing.  They are having so financial concerns.  The sister stated she has been in touch with Lauren about concern.  All questions were answered.

## 2014-09-19 NOTE — Progress Notes (Signed)
See progress note under physician encounter. 

## 2014-09-19 NOTE — Progress Notes (Signed)
Reports taking decadron 4 mg every six hours. Reports intermittent headaches. Denies nausea, vomiting or dizziness. Denies diplopia or ringing in the ears. Denies episodes of confusion or seizures. Denies hx of radiation therapy or having a pacemaker. Vitals stable. Reports a productive cough with thick white sputum. Denies shortness of breath or difficulty swallowing. Reports occasional right rib pain related to cough.

## 2014-09-19 NOTE — Progress Notes (Signed)
  Radiation Oncology         (336) [phone] ________________________________  Name: Brendan Hooper MRN: [id number]  Date: 09/19/2014  DOB: [birth year]  SIMULATION AND TREATMENT PLANNING NOTE    ICD-9-CM ICD-10-CM   1. Brain metastases 198.3 C79.31     DIAGNOSIS:  69 year old gentleman with numerous brain metastases and postobstructive pneumonia.  NARRATIVE:  The patient was brought to the [place].  Identity was confirmed.  All relevant records and images related to the planned course of therapy were reviewed.  The patient freely provided informed written consent to proceed with treatment after reviewing the details related to the planned course of therapy. The consent form was witnessed and verified by the simulation staff.  Then, the patient was set-up in a stable reproducible  supine position for radiation therapy.  CT images were obtained.  Surface markings were placed.  The CT images were loaded into the planning software.  Then the target and avoidance structures were contoured.  Treatment planning then occurred.  The radiation prescription was entered and confirmed.  Then, I designed and supervised the construction of a total of 5 medically necessary complex treatment devices, including a custom made thermoplastic mask used for immobilization and two complex multileaf collimators to cover the entire intracranial contents, while shielding the eyes and face.  2 additional multileaf collimator for designed to shape radiation around the patient's primary lung cancer and subcarinal adenopathy while shielding the heart and lungs maximally. Each [facility] is independently created to account for beam divergence.  The right and left lateral fields will be treated with 6 MV X-rays.  I have requested : Isodose Plan.    PLAN:  The whole brain and chest will be treated to 35 Gy in 14 fractions.  ________________________________  Sheral Apley Tammi Klippel, M.D.

## 2014-09-19 NOTE — Telephone Encounter (Signed)
returned call and s.w pt and transferred to desk nurse

## 2014-09-19 NOTE — Telephone Encounter (Signed)
I tried reaching patient. Someone picked the phone and stated patient was not there.

## 2014-09-20 ENCOUNTER — Ambulatory Visit
Admission: RE | Admit: 2014-09-20 | Discharge: 2014-09-20 | Disposition: A | Discharge status: Home / Self Care | Payer: Medicare Other | Source: Ambulatory Visit | Attending: Radiation Oncology | Admitting: Radiation Oncology

## 2014-09-20 ENCOUNTER — Encounter: Payer: Self-pay | Admitting: Radiation Oncology

## 2014-09-20 ENCOUNTER — Other Ambulatory Visit: Payer: Self-pay | Admitting: *Deleted

## 2014-09-20 DIAGNOSIS — C3491 Malignant neoplasm of unspecified part of right bronchus or lung: Secondary | ICD-10-CM

## 2014-09-20 NOTE — Progress Notes (Signed)
  Radiation Oncology         [phone]) [phone] ________________________________  Name: Brendan Hooper MRN: [id number]  Date: 09/20/2014  DOB: 03-28-1946  Simulation Verification Note  No diagnosis found.  Status: outpatient  NARRATIVE: The patient was brought to the treatment unit and placed in the planned treatment position. The clinical setup was verified. Then port films were obtained and uploaded to the radiation oncology medical record software.  The treatment beams were carefully compared against the planned radiation fields. The position location and shape of the radiation fields was reviewed. They targeted volume of tissue appears to be appropriately covered by the radiation beams. Organs at risk appear to be excluded as planned.  Based on my personal review, I approved the simulation verification. The patient's treatment will proceed as planned.  -----------------------------------  Blair Promise, PhD, MD

## 2014-09-21 ENCOUNTER — Ambulatory Visit
Admission: RE | Admit: 2014-09-21 | Discharge: 2014-09-21 | Disposition: A | Discharge status: Home / Self Care | Payer: Medicare Other | Source: Ambulatory Visit | Attending: Radiation Oncology | Admitting: Radiation Oncology

## 2014-09-21 DIAGNOSIS — C3491 Malignant neoplasm of unspecified part of right bronchus or lung: Secondary | ICD-10-CM | POA: Diagnosis not present

## 2014-09-22 ENCOUNTER — Ambulatory Visit
Admission: RE | Admit: 2014-09-22 | Discharge: 2014-09-22 | Disposition: A | Discharge status: Home / Self Care | Payer: Medicare Other | Source: Ambulatory Visit | Attending: Radiation Oncology | Admitting: Radiation Oncology

## 2014-09-22 DIAGNOSIS — C3491 Malignant neoplasm of unspecified part of right bronchus or lung: Secondary | ICD-10-CM | POA: Diagnosis not present

## 2014-09-23 ENCOUNTER — Ambulatory Visit
Admission: RE | Admit: 2014-09-23 | Discharge: 2014-09-23 | Disposition: A | Discharge status: Home / Self Care | Payer: Medicare Other | Source: Ambulatory Visit | Attending: Radiation Oncology | Admitting: Radiation Oncology

## 2014-09-23 ENCOUNTER — Encounter: Payer: Self-pay | Admitting: Radiation Oncology

## 2014-09-23 VITALS — BP 143/71 | HR 98 | Resp 16 | Wt 164.1 lb

## 2014-09-23 DIAGNOSIS — C3491 Malignant neoplasm of unspecified part of right bronchus or lung: Secondary | ICD-10-CM | POA: Diagnosis not present

## 2014-09-23 DIAGNOSIS — C7931 Secondary malignant neoplasm of brain: Secondary | ICD-10-CM

## 2014-09-23 MED ORDER — OXYCODONE HCL 5 MG PO TABS: 5.0000 mg | ORAL_TABLET | ORAL | Status: DC | PRN

## 2014-09-23 MED ORDER — DEXAMETHASONE 4 MG PO TABS: 4.0000 mg | ORAL_TABLET | Freq: Three times a day (TID) | ORAL | Status: DC

## 2014-09-23 NOTE — Addendum Note (Signed)
Encounter addended by: Heywood Footman, RN on: 09/23/2014  1:27 PM<BR>     Documentation filed: Vitals Section

## 2014-09-23 NOTE — Progress Notes (Signed)
  Radiation Oncology         [phone] ________________________________  Name: Brendan Hooper MRN: [id number]  Date: 09/23/2014  DOB: 12/26/1945  Weekly Radiation Therapy Management    ICD-9-CM ICD-10-CM   1. Bronchogenic cancer of right lung 162.9 C34.91   2. Brain metastases 198.3 C79.31     Current Dose: 10 Gy     Planned Dose:  35 Gy  Narrative . . . . . . . . The patient presents for routine under treatment assessment.                                  Patient requesting refill of decadron. Patient reports taking decadron 4 mg qid. Denies headache, dizziness, nausea or vomiting. Reports a productive cough with white sputum. Denies difficulty swallowing or a sore throat. Provided patient with radiaplex and directed upon use. Weight and vitals stable. Denies pain.                                  Set-up films were reviewed.                                 The chart was checked. Physical Findings. . .  weight is 164 lb 1.6 oz (74.435 kg). . Weight essentially stable.  No significant changes. Impression . . . . . . . The patient is tolerating radiation. Plan . . . . . . . . . . . . Continue treatment as planned.  Refilled oxycodone and cut dex to 4 mg TID  ________________________________  Sheral Apley. Tammi Klippel, M.D.

## 2014-09-23 NOTE — Progress Notes (Signed)
Patient requesting refill of decadron. Patient reports taking decadron 4 mg qid. Denies headache, dizziness, nausea or vomiting. Reports a productive cough with white sputum. Denies difficulty swallowing or a sore throat. Provided patient with radiaplex and directed upon use. Weight and vitals stable. Denies pain.

## 2014-09-26 ENCOUNTER — Ambulatory Visit: Payer: Medicare Other | Admitting: Oncology

## 2014-09-26 ENCOUNTER — Other Ambulatory Visit: Payer: Medicare Other

## 2014-09-26 ENCOUNTER — Ambulatory Visit: Payer: Medicare Other

## 2014-09-26 ENCOUNTER — Ambulatory Visit
Admission: RE | Admit: 2014-09-26 | Discharge: 2014-09-26 | Disposition: A | Discharge status: Home / Self Care | Payer: Medicare Other | Source: Ambulatory Visit | Attending: Radiation Oncology | Admitting: Radiation Oncology

## 2014-09-26 DIAGNOSIS — C3491 Malignant neoplasm of unspecified part of right bronchus or lung: Secondary | ICD-10-CM | POA: Diagnosis not present

## 2014-09-27 ENCOUNTER — Ambulatory Visit
Admission: RE | Admit: 2014-09-27 | Discharge: 2014-09-27 | Disposition: A | Discharge status: Home / Self Care | Payer: Medicare Other | Source: Ambulatory Visit | Attending: Radiation Oncology | Admitting: Radiation Oncology

## 2014-09-27 DIAGNOSIS — C3491 Malignant neoplasm of unspecified part of right bronchus or lung: Secondary | ICD-10-CM | POA: Diagnosis not present

## 2014-09-28 ENCOUNTER — Ambulatory Visit
Admission: RE | Admit: 2014-09-28 | Discharge: 2014-09-28 | Disposition: A | Discharge status: Home / Self Care | Payer: Medicare Other | Source: Ambulatory Visit | Attending: Radiation Oncology | Admitting: Radiation Oncology

## 2014-09-28 DIAGNOSIS — C3491 Malignant neoplasm of unspecified part of right bronchus or lung: Secondary | ICD-10-CM | POA: Diagnosis not present

## 2014-09-29 ENCOUNTER — Ambulatory Visit
Admission: RE | Admit: 2014-09-29 | Discharge: 2014-09-29 | Disposition: A | Discharge status: Home / Self Care | Payer: Medicare Other | Source: Ambulatory Visit | Attending: Radiation Oncology | Admitting: Radiation Oncology

## 2014-09-29 DIAGNOSIS — C3491 Malignant neoplasm of unspecified part of right bronchus or lung: Secondary | ICD-10-CM | POA: Diagnosis not present

## 2014-09-30 ENCOUNTER — Ambulatory Visit
Admission: RE | Admit: 2014-09-30 | Discharge: 2014-09-30 | Disposition: A | Discharge status: Home / Self Care | Payer: Medicare Other | Source: Ambulatory Visit | Attending: Radiation Oncology | Admitting: Radiation Oncology

## 2014-09-30 ENCOUNTER — Encounter: Payer: Self-pay | Admitting: Radiation Oncology

## 2014-09-30 ENCOUNTER — Encounter: Payer: Self-pay | Admitting: Internal Medicine

## 2014-09-30 VITALS — BP 122/71 | HR 93 | Resp 16 | Wt 160.3 lb

## 2014-09-30 DIAGNOSIS — C3491 Malignant neoplasm of unspecified part of right bronchus or lung: Secondary | ICD-10-CM

## 2014-09-30 DIAGNOSIS — C7931 Secondary malignant neoplasm of brain: Secondary | ICD-10-CM

## 2014-09-30 NOTE — Progress Notes (Signed)
Pt is approved thru [employer] for [employer] from 09/29/14 to 09/29/15 or when the benefit cap has been met.  Expenses can be submitted for reimbursement from [facility] 07/01/14 to 09/29/15.  The amount of the grant is $10,000.  Will send copy of letter to [facility] in billing and gave letter to Raquel to file.

## 2014-09-30 NOTE — Progress Notes (Signed)
  Radiation Oncology         [phone] ________________________________  Name: Brendan Hooper MRN: [id number]  Date: 09/30/2014  DOB: 1945-10-25  Weekly Radiation Therapy Management    ICD-9-CM ICD-10-CM   1. Bronchogenic cancer of right lung 162.9 C34.91   2. Brain metastases 198.3 C79.31     Current Dose: 22.5 Gy     Planned Dose:  35 Gy  Narrative . . . . . . . . The patient presents for routine under treatment assessment.                                  Reports taking decadron 4 mg tid. Reports a nagging cough that is occasionally productive with white sputum. Denies hemoptysis. Reports cough keeps him awake at night. Denies pain. Weight and vitals stable. Denies headache, dizziness, nausea, vomiting or chills. Denies dry itching or darkened skin within treatment field. Reports a good appetite. Denies difficulty swallowing food                                 Set-up films were reviewed.                                 The chart was checked. Physical Findings. . .  weight is 160 lb 4.8 oz (72.712 kg). His blood pressure is 122/71 and his pulse is 93. His respiration is 16. . Weight essentially stable.  No significant changes. Impression . . . . . . . The patient is tolerating radiation. Plan . . . . . . . . . . . . Continue treatment as planned.  Cut dexamethasone to 4 mg TID  ________________________________  Sheral Apley. Tammi Klippel, M.D.

## 2014-09-30 NOTE — Progress Notes (Signed)
Reports taking decadron 4 mg tid. Reports a nagging cough that is occasionally productive with white sputum. Denies hemoptysis. Reports cough keeps him awake at night. Denies pain. Weight and vitals stable. Denies headache, dizziness, nausea, vomiting or chills. Denies dry itching or darkened skin within treatment field. Reports a good appetite. Denies difficulty swallowing food.

## 2014-10-03 ENCOUNTER — Ambulatory Visit
Admission: RE | Admit: 2014-10-03 | Discharge: 2014-10-03 | Disposition: A | Discharge status: Home / Self Care | Payer: Medicare Other | Source: Ambulatory Visit | Attending: Radiation Oncology | Admitting: Radiation Oncology

## 2014-10-03 ENCOUNTER — Other Ambulatory Visit: Payer: Medicare Other

## 2014-10-03 DIAGNOSIS — C3491 Malignant neoplasm of unspecified part of right bronchus or lung: Secondary | ICD-10-CM | POA: Diagnosis not present

## 2014-10-04 ENCOUNTER — Ambulatory Visit
Admission: RE | Admit: 2014-10-04 | Discharge: 2014-10-04 | Disposition: A | Discharge status: Home / Self Care | Payer: Medicare Other | Source: Ambulatory Visit | Attending: Radiation Oncology | Admitting: Radiation Oncology

## 2014-10-04 ENCOUNTER — Encounter: Payer: Self-pay | Admitting: Internal Medicine

## 2014-10-04 DIAGNOSIS — C3491 Malignant neoplasm of unspecified part of right bronchus or lung: Secondary | ICD-10-CM | POA: Diagnosis not present

## 2014-10-04 NOTE — Progress Notes (Signed)
PAN approved asst with [employer] 09/29/14-09/29/15  10,000.00. They will go back to 07/01/14. I will send to medical records and [facility] sent email to billing.

## 2014-10-05 ENCOUNTER — Ambulatory Visit
Admission: RE | Admit: 2014-10-05 | Discharge: 2014-10-05 | Disposition: A | Discharge status: Home / Self Care | Payer: Medicare Other | Source: Ambulatory Visit | Attending: Radiation Oncology | Admitting: Radiation Oncology

## 2014-10-05 DIAGNOSIS — C3491 Malignant neoplasm of unspecified part of right bronchus or lung: Secondary | ICD-10-CM | POA: Diagnosis not present

## 2014-10-06 ENCOUNTER — Ambulatory Visit
Admission: RE | Admit: 2014-10-06 | Discharge: 2014-10-06 | Disposition: A | Discharge status: Home / Self Care | Payer: Medicare Other | Source: Ambulatory Visit | Attending: Radiation Oncology | Admitting: Radiation Oncology

## 2014-10-06 DIAGNOSIS — C3491 Malignant neoplasm of unspecified part of right bronchus or lung: Secondary | ICD-10-CM | POA: Diagnosis not present

## 2014-10-06 NOTE — Progress Notes (Signed)
  Radiation Oncology         [phone] ________________________________  Name: Brendan Hooper MRN: [id number]  Date: 10/07/2014  DOB: [birth year]  Weekly Radiation Therapy Management    ICD-9-CM ICD-10-CM   1. Bronchogenic cancer of right lung 162.9 C34.91 dexamethasone (DECADRON) 4 MG tablet  2. Brain metastases 198.3 C79.31 dexamethasone (DECADRON) 4 MG tablet    Current Dose: 35 Gy     Planned Dose:  35 Gy  Narrative . . . . . . . . The patient presents for routine under treatment assessment.                                   No complaints                                 Set-up films were reviewed.                                 The chart was checked. Physical Findings. . .  height is 6' (1.829 m) and weight is 155 lb 1.6 oz (70.353 kg). His oral temperature is 98.1 F (36.7 C). His blood pressure is 125/72 and his pulse is 92. His oxygen saturation is 99%. . Weight essentially stable.  No significant changes. Impression . . . . . . . The patient is tolerating radiation. Plan . . . . . . . . . . . . Complete treatment today as planned.  Given steroid taper.  ________________________________  Sheral Apley Tammi Klippel, M.D.

## 2014-10-07 ENCOUNTER — Encounter: Payer: Self-pay | Admitting: Radiation Oncology

## 2014-10-07 ENCOUNTER — Ambulatory Visit
Admission: RE | Admit: 2014-10-07 | Discharge: 2014-10-07 | Disposition: A | Discharge status: Home / Self Care | Payer: Medicare Other | Source: Ambulatory Visit | Attending: Radiation Oncology | Admitting: Radiation Oncology

## 2014-10-07 VITALS — BP 125/72 | HR 92 | Temp 98.1°F | Ht 72.0 in | Wt 155.1 lb

## 2014-10-07 DIAGNOSIS — C3491 Malignant neoplasm of unspecified part of right bronchus or lung: Secondary | ICD-10-CM

## 2014-10-07 DIAGNOSIS — C7931 Secondary malignant neoplasm of brain: Secondary | ICD-10-CM

## 2014-10-07 MED ORDER — DEXAMETHASONE 4 MG PO TABS: 2.0000 mg | ORAL_TABLET | Freq: Three times a day (TID) | ORAL | Status: DC

## 2014-10-07 NOTE — Progress Notes (Signed)
Brendan Hooper has completed treatment with 14 fractions to his chest and brain.  He denies pain today.  He takes oxycodone as needed.  He denies having headaches, nausea, dizziness's and double vision.  He reports a cough, especially at night, with white sputum.  He denies shortness of breath.  He reports that he is taking decadron 4 mg twice daily but is not sure.  He said his daughter sorts out his medications for him.  He reports a good appetite although his weight is down 5 lbs from last week.  He reports some trouble swallowing.  The skin on his chest and back is intact.  He has dryness and hyperpigmentation on his forehead and scalp. He is using biafine.  He has been given a one month follow up card.   BP 125/72 mmHg  Pulse 92  Temp(Src) 98.1 F (36.7 C) (Oral)  Ht 6' (1.829 m)  Wt 155 lb 1.6 oz (70.353 kg)  BMI 21.03 kg/m2  SpO2 99%

## 2014-10-09 NOTE — Progress Notes (Signed)
  Radiation Oncology         [phone] ________________________________  Name: Brendan Hooper MRN: [id number]  Date: 10/07/2014  DOB: Jun 30, 1946  End of Treatment Note  ICD-9-CM ICD-10-CM     1. Brain metastases 198.3 C79.31     DIAGNOSIS: 69 year old gentleman with numerous brain metastases and postobstructive pneumonia.     Indication for treatment:  Palliation       Radiation treatment dates:   09/20/2014-2/19/206  Site/dose:    1. The brain was treated to 35 Gy in 14 fractions of 2.5 Gy 2. The lung was treated to 35 Gy in 14 fractions of 2.5 Gy  Beams/energy:    1. The brain was treated using a thermoplastic mask for immobilization. Right and left lateral 6 megavolt x-ray beams were used and carefully-shaped around the patient's intracranial contents excluding the eyes and face 2. The lung was treated using a 3 beam technique with gantry angles at 0, 270, and 180 the fields were carefully conformed around the target volume with multileaf collimators  Narrative: The patient tolerated radiation treatment relatively well.    Plan: The patient has completed radiation treatment. The patient will return to radiation oncology clinic for routine followup in one month. I advised him to call or return sooner if he has any questions or concerns related to his recovery or treatment. ________________________________  Sheral Apley. Tammi Klippel, M.D.

## 2014-10-10 ENCOUNTER — Other Ambulatory Visit: Payer: Medicare Other

## 2014-10-13 ENCOUNTER — Telehealth: Payer: Self-pay | Admitting: *Deleted

## 2014-10-13 NOTE — Telephone Encounter (Signed)
ADDRESS COMPLETED AND VERIFIED.

## 2014-10-17 ENCOUNTER — Other Ambulatory Visit: Payer: Self-pay | Admitting: *Deleted

## 2014-10-17 ENCOUNTER — Telehealth: Payer: Self-pay | Admitting: *Deleted

## 2014-10-17 ENCOUNTER — Other Ambulatory Visit (HOSPITAL_BASED_OUTPATIENT_CLINIC_OR_DEPARTMENT_OTHER): Payer: Medicare Other

## 2014-10-17 ENCOUNTER — Encounter: Payer: Self-pay | Admitting: Oncology

## 2014-10-17 ENCOUNTER — Telehealth: Payer: Self-pay | Admitting: Oncology

## 2014-10-17 ENCOUNTER — Other Ambulatory Visit: Payer: Medicare Other

## 2014-10-17 ENCOUNTER — Ambulatory Visit: Payer: Medicare Other | Admitting: Nutrition

## 2014-10-17 ENCOUNTER — Encounter: Payer: Self-pay | Admitting: *Deleted

## 2014-10-17 ENCOUNTER — Ambulatory Visit (HOSPITAL_BASED_OUTPATIENT_CLINIC_OR_DEPARTMENT_OTHER): Payer: Medicare Other

## 2014-10-17 ENCOUNTER — Ambulatory Visit (HOSPITAL_BASED_OUTPATIENT_CLINIC_OR_DEPARTMENT_OTHER): Payer: Medicare Other | Admitting: Oncology

## 2014-10-17 VITALS — BP 129/67 | HR 99 | Temp 98.3°F | Resp 18 | Ht 72.0 in | Wt 153.0 lb

## 2014-10-17 DIAGNOSIS — C7989 Secondary malignant neoplasm of other specified sites: Secondary | ICD-10-CM | POA: Diagnosis not present

## 2014-10-17 DIAGNOSIS — Z5111 Encounter for antineoplastic chemotherapy: Secondary | ICD-10-CM

## 2014-10-17 DIAGNOSIS — C3491 Malignant neoplasm of unspecified part of right bronchus or lung: Secondary | ICD-10-CM

## 2014-10-17 LAB — CBC WITH DIFFERENTIAL/PLATELET
BASO%: 0.4 % (ref 0.0–2.0)
Basophils Absolute: 0 10*3/uL (ref 0.0–0.1)
EOS%: 0.1 % (ref 0.0–7.0)
Eosinophils Absolute: 0 10*3/uL (ref 0.0–0.5)
HEMATOCRIT: 35.3 % — AB (ref 38.4–49.9)
HEMOGLOBIN: 11.7 g/dL — AB (ref 13.0–17.1)
LYMPH%: 7.2 % — AB (ref 14.0–49.0)
MCH: 26.1 pg — AB (ref 27.2–33.4)
MCHC: 33.1 g/dL (ref 32.0–36.0)
MCV: 78.7 fL — ABNORMAL LOW (ref 79.3–98.0)
MONO#: 0.6 10*3/uL (ref 0.1–0.9)
MONO%: 6.1 % (ref 0.0–14.0)
NEUT%: 86.2 % — ABNORMAL HIGH (ref 39.0–75.0)
NEUTROS ABS: 8.2 10*3/uL — AB (ref 1.5–6.5)
Platelets: 218 10*3/uL (ref 140–400)
RBC: 4.49 10*6/uL (ref 4.20–5.82)
RDW: 20.7 % — ABNORMAL HIGH (ref 11.0–14.6)
WBC: 9.5 10*3/uL (ref 4.0–10.3)
lymph#: 0.7 10*3/uL — ABNORMAL LOW (ref 0.9–3.3)

## 2014-10-17 LAB — COMPREHENSIVE METABOLIC PANEL (CC13)
ALT: 14 U/L (ref 0–55)
AST: 18 U/L (ref 5–34)
Albumin: 2.7 g/dL — ABNORMAL LOW (ref 3.5–5.0)
Alkaline Phosphatase: 80 U/L (ref 40–150)
Anion Gap: 10 mEq/L (ref 3–11)
BILIRUBIN TOTAL: 0.26 mg/dL (ref 0.20–1.20)
BUN: 7.8 mg/dL (ref 7.0–26.0)
CHLORIDE: 104 meq/L (ref 98–109)
CO2: 26 mEq/L (ref 22–29)
CREATININE: 0.7 mg/dL (ref 0.7–1.3)
Calcium: 9.8 mg/dL (ref 8.4–10.4)
EGFR: >90 mL/min/{1.73_m2} (ref >90)
Glucose: 139 mg/dl (ref 70–140)
Potassium: 3.7 mEq/L (ref 3.5–5.1)
Sodium: 140 mEq/L (ref 136–145)
TOTAL PROTEIN: 6.8 g/dL (ref 6.4–8.3)

## 2014-10-17 MED ORDER — CARBOPLATIN CHEMO INJECTION 600 MG/60ML
452.0000 mg | Freq: Once | INTRAVENOUS | Status: AC
  Administered 2014-10-17: 450 mg via INTRAVENOUS
  Filled 2014-10-17: qty 45

## 2014-10-17 MED ORDER — SODIUM CHLORIDE 0.9 % IV SOLN
485.0000 mg/m2 | Freq: Once | INTRAVENOUS | Status: AC
  Administered 2014-10-17: 900 mg via INTRAVENOUS
  Filled 2014-10-17: qty 36

## 2014-10-17 MED ORDER — ONDANSETRON 16 MG/50ML IVPB (CHCC)
16.0000 mg | Freq: Once | INTRAVENOUS | Status: AC
  Administered 2014-10-17: 16 mg via INTRAVENOUS

## 2014-10-17 MED ORDER — DEXAMETHASONE SODIUM PHOSPHATE 20 MG/5ML IJ SOLN
INTRAMUSCULAR | Status: AC
  Filled 2014-10-17: qty 5

## 2014-10-17 MED ORDER — SODIUM CHLORIDE 0.9 % IJ SOLN
10.0000 mL | INTRAMUSCULAR | Status: DC | PRN
  Administered 2014-10-17: 10 mL
  Filled 2014-10-17: qty 10

## 2014-10-17 MED ORDER — HEPARIN SOD (PORK) LOCK FLUSH 100 UNIT/ML IV SOLN
500.0000 [IU] | Freq: Once | INTRAVENOUS | Status: AC | PRN
  Administered 2014-10-17: 500 [IU]
  Filled 2014-10-17: qty 5

## 2014-10-17 MED ORDER — ONDANSETRON 16 MG/50ML IVPB (CHCC)
INTRAVENOUS | Status: AC
  Filled 2014-10-17: qty 16

## 2014-10-17 MED ORDER — DEXAMETHASONE SODIUM PHOSPHATE 20 MG/5ML IJ SOLN
20.0000 mg | Freq: Once | INTRAMUSCULAR | Status: AC
  Administered 2014-10-17: 20 mg via INTRAVENOUS

## 2014-10-17 MED ORDER — SODIUM CHLORIDE 0.9 % IV SOLN
Freq: Once | INTRAVENOUS | Status: AC
  Administered 2014-10-17: 12:00:00 via INTRAVENOUS

## 2014-10-17 NOTE — Telephone Encounter (Signed)
Per staff message and POF I have scheduled appts. Advised scheduler of appts. JMW  

## 2014-10-17 NOTE — Progress Notes (Signed)
69 year old male diagnosed with stage IV non-small cell lung cancer receiving chemotherapy.  Patient has brain metastases.  Past medical history includes alcohol, hypertension, tobacco, severe protein calorie malnutrition.  Medications include Decadron, Folvite, multivitamin, and Compazine.  Labs include creatinine 0.6 and albumin 2.6 on February 1.  Height: 6 feet 0 inches. Weight: 153 pounds February 29. Usual body weight: 155-160 pounds. BMI: 20.75.  Patient reports appetite and oral intake have improved. Weight decreased and was documented as 153 pounds February 29, down from 155.1 pounds February 19. Patient denies nutrition impact symptoms. Patient enjoys drinking ensure. He does not drink this on a regular basis.  Nutrition diagnosis:  Food and nutrition related knowledge deficit related to diagnosis of metastatic lung cancer and associated treatments as evidenced by no prior need for nutrition related information.  Intervention:  Educated patient on strategies for increasing calories and protein in small frequent meals and snacks. Recommended patient began Ensure Plus twice a day between meals. Provided coupons. Questions answered.  Teach back method used.  Monitoring, evaluation, goals: Patient will increase oral intake to minimize further weight loss.  Next visit: Monday, March 21, during chemotherapy.  **Disclaimer: This note was dictated with voice recognition software. Similar sounding words can inadvertently be transcribed and this note may contain transcription errors which may not have been corrected upon publication of note.**

## 2014-10-17 NOTE — Patient Instructions (Signed)
[facility] Discharge Instructions for Patients Receiving Chemotherapy  Today you received the following chemotherapy agents: Alimta and Carboplatin   To help prevent nausea and vomiting after your treatment, we encourage you to take your nausea medication as prescribed.    If you develop nausea and vomiting that is not controlled by your nausea medication, call the clinic.   BELOW ARE SYMPTOMS THAT SHOULD BE REPORTED IMMEDIATELY:  *FEVER GREATER THAN 100.5 F  *CHILLS WITH OR WITHOUT FEVER  NAUSEA AND VOMITING THAT IS NOT CONTROLLED WITH YOUR NAUSEA MEDICATION  *UNUSUAL SHORTNESS OF BREATH  *UNUSUAL BRUISING OR BLEEDING  TENDERNESS IN MOUTH AND THROAT WITH OR WITHOUT PRESENCE OF ULCERS  *URINARY PROBLEMS  *BOWEL PROBLEMS  UNUSUAL RASH Items with * indicate a potential emergency and should be followed up as soon as possible.  Feel free to call the clinic you have any questions or concerns. The clinic phone number is [phone].

## 2014-10-17 NOTE — [facility] Oncology Navigator Note (Unsigned)
Followed up with Brendan Hooper at [facility] today.  He is with his brother.  He stated he is doing well. His brother is bringing him to his appt.  Patient has no complaints today.

## 2014-10-17 NOTE — Progress Notes (Signed)
[place] [phone]   [phone]   OFFICE PROGRESS NOTE  REFERRING PHYSICIAN: Dr. Vernell Leep  DIAGNOSIS: stage IV (T3, N2, M1 B) non-small cell lung cancer, adenocarcinoma presented with large right lung mass in addition to mediastinal lymphadenopathy and metastatic lesion in the right thigh diagnosed in December 2015.  CURRENT THERAPY: Systemic chemotherapy with carboplatin AUC of 5 and Alimta 500 mg/m started on 09/05/2014.  HPI Brendan Hooper is a 69 y.o. male is seen for routine follow-up prior to cycle 2 of his chemotherapy. His second cycle of chemotherapy was delayed because he recently completed radiation to his brain for multiple brain metastases as well as radiation to his large lung mass. He states that he tolerated his radiation quite well. He feels well today is ready to resume his chemotherapy. He denied having any significant chest pain, shortness of breath but continues to have cough with no hemoptysis. The patient has occasional headache but no visual changes. He denied having any significant nausea, vomiting or change in his bowel movements. Right rib pain has improved and he uses oxycodone only intermittently. States that he has been taking his folic acid as directed.  HPI  Past Medical History  Diagnosis Date  . Hypertension   . GERD (gastroesophageal reflux disease)   . Headache(784.0)   . Sickle cell anemia   . 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 Lung mass   . 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 No past surgeries    . Portacath placement Left 09/01/2014    Procedure: INSERTION PORT-A-CATH;  Surgeon: Melrose Nakayama, MD;  Location: [facility] OR;  Service: Thoracic;  Laterality: Left;    Family History  Problem Relation Age of Onset  . Cancer Mother   . Stroke Father     Social History History  Substance Use Topics  .  Smoking status: Current Every Day Smoker -- 0.25 packs/day for 40 years    Types: Cigarettes  . Smokeless tobacco: Never Used  . Alcohol Use: 0.0 oz/week    0 Standard drinks or equivalent per week     Comment: social    No Known Allergies  Current Outpatient Prescriptions  Medication Sig Dispense Refill  . dexamethasone (DECADRON) 4 MG tablet 4 mg by mouth twice a day the day before, day of and day after the chemotherapy every 3 weeks 40 tablet 1  . dexamethasone (DECADRON) 4 MG tablet Take 0.5 tablets (2 mg total) by mouth 3 (three) times daily. Take 2 mg (1/2 pill) 2 times daily for 1 week, then 2 mg (1/2 pill) once daily for one week, then stop. 15 tablet 0  . folic acid (FOLVITE) 1 MG tablet Take 1 tablet (1 mg total) by mouth daily. 30 tablet 0  . lidocaine-prilocaine (EMLA) cream Apply 1 application topically as needed. 30 g 1  . Multiple Vitamin (MULTIVITAMIN WITH MINERALS) TABS tablet Take 1 tablet by mouth daily. 30 tablet 0  . nicotine (NICODERM CQ - DOSED IN MG/24 HOURS) 21 mg/24hr patch Place 1 patch (21 mg total) onto the skin daily. 28 patch 0  . oxyCODONE (OXY IR/ROXICODONE) 5 MG immediate release tablet Take 1-2 tablets (5-10 mg total) by mouth every 4 (four) hours as needed for moderate pain. 45 tablet 0  . prochlorperazine (COMPAZINE) 10 MG tablet Take 1 tablet (10 mg total)  by mouth every 6 (six) hours as needed for nausea or vomiting. 60 tablet 0  . thiamine 100 MG tablet Take 1 tablet (100 mg total) by mouth daily. 30 tablet 0   No current facility-administered medications for this visit.    Review of Systems  Constitutional: positive for fatigue Eyes: negative Ears, nose, mouth, throat, and face: negative Respiratory: positive for cough Cardiovascular: negative Gastrointestinal: negative Genitourinary:negative Integument/breast: negative Hematologic/lymphatic: negative Musculoskeletal:positive for bone pain Neurological: negative Behavioral/Psych:  negative Endocrine: negative Allergic/Immunologic: negative  Physical Exam  GYI:RSWNI, healthy, no distress, well nourished and well developed SKIN: skin color, texture, turgor are normal, no rashes or significant lesions HEAD: Normocephalic, No masses, lesions, tenderness or abnormalities EYES: normal, PERRLA, Conjunctiva are pink and non-injected EARS: External ears normal, Canals clear OROPHARYNX:no exudate, no erythema and lips, buccal mucosa, and tongue normal  NECK: supple, no adenopathy, no JVD LYMPH:  no palpable lymphadenopathy, no hepatosplenomegaly LUNGS: clear to auscultation , and palpation HEART: regular rate & rhythm and no murmurs ABDOMEN:abdomen soft, non-tender, normal bowel sounds and no masses or organomegaly BACK: Back symmetric, no curvature., No CVA tenderness EXTREMITIES:no joint deformities, effusion, or inflammation, no edema, no skin discoloration  NEURO: alert & oriented x 3 with fluent speech, no focal motor/sensory deficits  PERFORMANCE STATUS: ECOG 1  LABORATORY DATA: Lab Results  Component Value Date   WBC 9.5 10/17/2014   HGB 11.7* 10/17/2014   HCT 35.3* 10/17/2014   MCV 78.7* 10/17/2014   PLT 218 10/17/2014      Chemistry      Component Value Date/Time   NA 140 10/17/2014 1038   NA 141 09/01/2014 0639   K 3.7 10/17/2014 1038   K 5.0 09/01/2014 0639   CL 101 09/01/2014 0639   CO2 26 10/17/2014 1038   CO2 25 09/01/2014 0639   BUN 7.8 10/17/2014 1038   BUN 6 09/01/2014 0639   CREATININE 0.7 10/17/2014 1038   CREATININE 0.80 09/01/2014 0639      Component Value Date/Time   CALCIUM 9.8 10/17/2014 1038   CALCIUM 9.9 09/01/2014 0639   ALKPHOS 80 10/17/2014 1038   ALKPHOS 57 09/01/2014 0639   AST 18 10/17/2014 1038   AST 23 09/01/2014 0639   ALT 14 10/17/2014 1038   ALT 10 09/01/2014 0639   BILITOT 0.26 10/17/2014 1038   BILITOT 0.6 09/01/2014 0639       RADIOGRAPHIC STUDIES: No results found.  ASSESSMENT/PLAN: This is a very  pleasant 69 years old African-American male recently diagnosed with stage IV (T3, N2, M1 B) non-small cell lung cancer, adenocarcinoma presented with large right lung mass in addition to mediastinal lymphadenopathy and metastatic lesion in the right thigh diagnosed in December 2015. Foundation one results demonstrate positive for K-ras G12C, STK11 loss exons 2-4 NFKBIA amplification, NKX2-1 amplification, & TP53 K291*, O270J and negative for ALK, EGFR, BRAF, MET, RET, ERBB2.  The patient was seen and examined with Dr. [name]. The patient will proceed with cycle 2 of carboplatin AUC of 5 and Alimta 500 MG/M2. He has tolerated this well so far. We have again reminded him to take his folic acid on a daily basis. I have confirmed with him that he does have Compazine at home.  He will have weekly labs. The patient will return in 3 weeks prior to cycle 3 of his chemotherapy. We will plan to obtain restaging CT scans after 3 cycles of chemotherapy.  The patient was advised to call immediately if he has any concerning  symptoms in the interval. All questions were answered. The patient knows to call the clinic with any problems, questions or concerns. We can certainly see the patient much sooner if necessary.   Mikey Bussing 10/17/2014, 2:52 PM  ADDENDUM: Hematology/Oncology Attending: I had a face to face encounter with the patient. I recommended his care plan. This is a very pleasant 69 years old African-American male with metastatic non-small cell lung cancer, adenocarcinoma currently undergoing systemic chemotherapy with carboplatin and Alimta status post 1 cycle. The patient is doing fine today with no specific complaints. He recently completed whole brain irradiation for metastatic brain lesions. We will proceed with cycle #2 today as scheduled. He will come back for follow-up visit in 3 weeks with the next cycle of his treatment. The patient was advised to call immediately if he has any concerning  symptoms in the interval.  Disclaimer: This note was dictated with voice recognition software. Similar sounding words can inadvertently be transcribed and may be missed upon review. Eilleen Kempf., MD 10/17/2014

## 2014-10-17 NOTE — Telephone Encounter (Signed)
Labs/ov per 02/29 POF, sent msg to add chemo and give pt updated schedule before leaving chemo visit today...  KJ

## 2014-10-24 ENCOUNTER — Other Ambulatory Visit (HOSPITAL_BASED_OUTPATIENT_CLINIC_OR_DEPARTMENT_OTHER): Payer: Medicare Other

## 2014-10-24 DIAGNOSIS — C3491 Malignant neoplasm of unspecified part of right bronchus or lung: Secondary | ICD-10-CM

## 2014-10-24 LAB — CBC WITH DIFFERENTIAL/PLATELET
BASO%: 0.6 % (ref 0.0–2.0)
BASOS ABS: 0 10*3/uL (ref 0.0–0.1)
EOS ABS: 0 10*3/uL (ref 0.0–0.5)
EOS%: 2.5 % (ref 0.0–7.0)
HCT: 36.1 % — ABNORMAL LOW (ref 38.4–49.9)
HEMOGLOBIN: 11.8 g/dL — AB (ref 13.0–17.1)
LYMPH%: 34 % (ref 14.0–49.0)
MCH: 26.1 pg — ABNORMAL LOW (ref 27.2–33.4)
MCHC: 32.7 g/dL (ref 32.0–36.0)
MCV: 79.7 fL (ref 79.3–98.0)
MONO#: 0.1 10*3/uL (ref 0.1–0.9)
MONO%: 4.2 % (ref 0.0–14.0)
NEUT#: 1 10*3/uL — ABNORMAL LOW (ref 1.5–6.5)
NEUT%: 58.7 % (ref 39.0–75.0)
PLATELETS: 190 10*3/uL (ref 140–400)
RBC: 4.53 10*6/uL (ref 4.20–5.82)
RDW: 21 % — ABNORMAL HIGH (ref 11.0–14.6)
WBC: 1.7 10*3/uL — ABNORMAL LOW (ref 4.0–10.3)
lymph#: 0.6 10*3/uL — ABNORMAL LOW (ref 0.9–3.3)

## 2014-10-24 LAB — COMPREHENSIVE METABOLIC PANEL (CC13)
ALBUMIN: 2.9 g/dL — AB (ref 3.5–5.0)
ALT: 15 U/L (ref 0–55)
ANION GAP: 11 meq/L (ref 3–11)
AST: 16 U/L (ref 5–34)
Alkaline Phosphatase: 77 U/L (ref 40–150)
BUN: 11.2 mg/dL (ref 7.0–26.0)
CALCIUM: 9.9 mg/dL (ref 8.4–10.4)
CHLORIDE: 101 meq/L (ref 98–109)
CO2: 27 meq/L (ref 22–29)
Creatinine: 0.6 mg/dL — ABNORMAL LOW (ref 0.7–1.3)
GLUCOSE: 107 mg/dL (ref 70–140)
POTASSIUM: 3.7 meq/L (ref 3.5–5.1)
Sodium: 139 mEq/L (ref 136–145)
Total Bilirubin: 0.68 mg/dL (ref 0.20–1.20)
Total Protein: 6.8 g/dL (ref 6.4–8.3)

## 2014-10-31 ENCOUNTER — Other Ambulatory Visit (HOSPITAL_BASED_OUTPATIENT_CLINIC_OR_DEPARTMENT_OTHER): Payer: Medicare Other

## 2014-10-31 ENCOUNTER — Other Ambulatory Visit: Payer: Self-pay | Admitting: Medical Oncology

## 2014-10-31 DIAGNOSIS — C3491 Malignant neoplasm of unspecified part of right bronchus or lung: Secondary | ICD-10-CM

## 2014-10-31 DIAGNOSIS — C7931 Secondary malignant neoplasm of brain: Secondary | ICD-10-CM

## 2014-10-31 LAB — COMPREHENSIVE METABOLIC PANEL (CC13)
ALT: 17 U/L (ref 0–55)
AST: 15 U/L (ref 5–34)
Albumin: 3 g/dL — ABNORMAL LOW (ref 3.5–5.0)
Alkaline Phosphatase: 77 U/L (ref 40–150)
Anion Gap: 12 mEq/L — ABNORMAL HIGH (ref 3–11)
BILIRUBIN TOTAL: 0.33 mg/dL (ref 0.20–1.20)
BUN: 6.9 mg/dL — ABNORMAL LOW (ref 7.0–26.0)
CO2: 26 mEq/L (ref 22–29)
CREATININE: 0.6 mg/dL — AB (ref 0.7–1.3)
Calcium: 9.9 mg/dL (ref 8.4–10.4)
Chloride: 101 mEq/L (ref 98–109)
EGFR: >90 mL/min/{1.73_m2} (ref >90)
Glucose: 115 mg/dl (ref 70–140)
Potassium: 3.7 mEq/L (ref 3.5–5.1)
Sodium: 138 mEq/L (ref 136–145)
Total Protein: 7 g/dL (ref 6.4–8.3)

## 2014-10-31 LAB — CBC WITH DIFFERENTIAL/PLATELET
BASO%: 0.1 % (ref 0.0–2.0)
Basophils Absolute: 0 10*3/uL (ref 0.0–0.1)
EOS%: 0 % (ref 0.0–7.0)
Eosinophils Absolute: 0 10*3/uL (ref 0.0–0.5)
HCT: 33.6 % — ABNORMAL LOW (ref 38.4–49.9)
HEMOGLOBIN: 11.1 g/dL — AB (ref 13.0–17.1)
LYMPH%: 8.3 % — ABNORMAL LOW (ref 14.0–49.0)
MCH: 26.5 pg — AB (ref 27.2–33.4)
MCHC: 33 g/dL (ref 32.0–36.0)
MCV: 80.2 fL (ref 79.3–98.0)
MONO#: 0.6 10*3/uL (ref 0.1–0.9)
MONO%: 9.4 % (ref 0.0–14.0)
NEUT%: 82.2 % — AB (ref 39.0–75.0)
NEUTROS ABS: 4.9 10*3/uL (ref 1.5–6.5)
Platelets: 227 10*3/uL (ref 140–400)
RBC: 4.19 10*6/uL — AB (ref 4.20–5.82)
RDW: 21.9 % — ABNORMAL HIGH (ref 11.0–14.6)
WBC: 6 10*3/uL (ref 4.0–10.3)
lymph#: 0.5 10*3/uL — ABNORMAL LOW (ref 0.9–3.3)

## 2014-10-31 LAB — TECHNOLOGIST REVIEW

## 2014-10-31 MED ORDER — DEXAMETHASONE 4 MG PO TABS: ORAL_TABLET | ORAL | Status: DC

## 2014-10-31 MED ORDER — OXYCODONE HCL 5 MG PO TABS: 5.0000 mg | ORAL_TABLET | ORAL | Status: DC | PRN

## 2014-10-31 NOTE — Addendum Note (Signed)
Addended by: Ardeen Garland on: 10/31/2014 11:52 AM   Modules accepted: Orders, Medications

## 2014-10-31 NOTE — Progress Notes (Signed)
oxycodone rx locked in injection room.

## 2014-11-07 ENCOUNTER — Ambulatory Visit (HOSPITAL_BASED_OUTPATIENT_CLINIC_OR_DEPARTMENT_OTHER): Payer: Medicare Other

## 2014-11-07 ENCOUNTER — Encounter: Payer: Self-pay | Admitting: Medical Oncology

## 2014-11-07 ENCOUNTER — Other Ambulatory Visit (HOSPITAL_BASED_OUTPATIENT_CLINIC_OR_DEPARTMENT_OTHER): Payer: Medicare Other

## 2014-11-07 ENCOUNTER — Ambulatory Visit (HOSPITAL_BASED_OUTPATIENT_CLINIC_OR_DEPARTMENT_OTHER): Payer: Medicare Other | Admitting: Oncology

## 2014-11-07 ENCOUNTER — Ambulatory Visit: Payer: Medicare Other | Admitting: Nutrition

## 2014-11-07 ENCOUNTER — Encounter: Payer: Self-pay | Admitting: Oncology

## 2014-11-07 VITALS — BP 131/71 | HR 87 | Temp 98.5°F | Resp 18 | Ht 72.0 in | Wt 156.0 lb

## 2014-11-07 DIAGNOSIS — C3411 Malignant neoplasm of upper lobe, right bronchus or lung: Secondary | ICD-10-CM | POA: Diagnosis not present

## 2014-11-07 DIAGNOSIS — C7951 Secondary malignant neoplasm of bone: Secondary | ICD-10-CM | POA: Diagnosis not present

## 2014-11-07 DIAGNOSIS — C774 Secondary and unspecified malignant neoplasm of inguinal and lower limb lymph nodes: Secondary | ICD-10-CM | POA: Diagnosis not present

## 2014-11-07 DIAGNOSIS — R05 Cough: Secondary | ICD-10-CM

## 2014-11-07 DIAGNOSIS — C3491 Malignant neoplasm of unspecified part of right bronchus or lung: Secondary | ICD-10-CM

## 2014-11-07 DIAGNOSIS — Z5111 Encounter for antineoplastic chemotherapy: Secondary | ICD-10-CM | POA: Diagnosis not present

## 2014-11-07 DIAGNOSIS — R0781 Pleurodynia: Secondary | ICD-10-CM

## 2014-11-07 LAB — COMPREHENSIVE METABOLIC PANEL (CC13)
ALK PHOS: 69 U/L (ref 40–150)
ALT: 14 U/L (ref 0–55)
AST: 14 U/L (ref 5–34)
Albumin: 2.9 g/dL — ABNORMAL LOW (ref 3.5–5.0)
Anion Gap: 10 mEq/L (ref 3–11)
BILIRUBIN TOTAL: 0.21 mg/dL (ref 0.20–1.20)
BUN: 8.1 mg/dL (ref 7.0–26.0)
CO2: 26 mEq/L (ref 22–29)
Calcium: 9.5 mg/dL (ref 8.4–10.4)
Chloride: 105 mEq/L (ref 98–109)
Creatinine: 0.7 mg/dL (ref 0.7–1.3)
EGFR: >90 mL/min/{1.73_m2} (ref >90)
Glucose: 115 mg/dl (ref 70–140)
Potassium: 3.5 mEq/L (ref 3.5–5.1)
SODIUM: 141 meq/L (ref 136–145)
TOTAL PROTEIN: 6.6 g/dL (ref 6.4–8.3)

## 2014-11-07 LAB — CBC WITH DIFFERENTIAL/PLATELET
BASO%: 0.2 % (ref 0.0–2.0)
Basophils Absolute: 0 10*3/uL (ref 0.0–0.1)
EOS ABS: 0 10*3/uL (ref 0.0–0.5)
EOS%: 0 % (ref 0.0–7.0)
HCT: 33.6 % — ABNORMAL LOW (ref 38.4–49.9)
HEMOGLOBIN: 11.6 g/dL — AB (ref 13.0–17.1)
LYMPH%: 9.2 % — ABNORMAL LOW (ref 14.0–49.0)
MCH: 27.4 pg (ref 27.2–33.4)
MCHC: 34.5 g/dL (ref 32.0–36.0)
MCV: 79.2 fL — AB (ref 79.3–98.0)
MONO#: 1.2 10*3/uL — ABNORMAL HIGH (ref 0.1–0.9)
MONO%: 9.9 % (ref 0.0–14.0)
NEUT#: 9.7 10*3/uL — ABNORMAL HIGH (ref 1.5–6.5)
NEUT%: 80.7 % — ABNORMAL HIGH (ref 39.0–75.0)
NRBC: 1 % — AB (ref 0–0)
Platelets: 443 10*3/uL — ABNORMAL HIGH (ref 140–400)
RBC: 4.24 10*6/uL (ref 4.20–5.82)
RDW: 21.7 % — AB (ref 11.0–14.6)
WBC: 12 10*3/uL — ABNORMAL HIGH (ref 4.0–10.3)
lymph#: 1.1 10*3/uL (ref 0.9–3.3)

## 2014-11-07 LAB — TECHNOLOGIST REVIEW

## 2014-11-07 MED ORDER — HEPARIN SOD (PORK) LOCK FLUSH 100 UNIT/ML IV SOLN
500.0000 [IU] | Freq: Once | INTRAVENOUS | Status: AC | PRN
  Administered 2014-11-07: 500 [IU]
  Filled 2014-11-07: qty 5

## 2014-11-07 MED ORDER — CARBOPLATIN CHEMO INJECTION 600 MG/60ML
452.0000 mg | Freq: Once | INTRAVENOUS | Status: AC
  Administered 2014-11-07: 450 mg via INTRAVENOUS
  Filled 2014-11-07: qty 45

## 2014-11-07 MED ORDER — SODIUM CHLORIDE 0.9 % IV SOLN
Freq: Once | INTRAVENOUS | Status: AC
  Administered 2014-11-07: 12:00:00 via INTRAVENOUS

## 2014-11-07 MED ORDER — CYANOCOBALAMIN 1000 MCG/ML IJ SOLN
INTRAMUSCULAR | Status: AC
  Filled 2014-11-07: qty 1

## 2014-11-07 MED ORDER — SODIUM CHLORIDE 0.9 % IJ SOLN
10.0000 mL | INTRAMUSCULAR | Status: DC | PRN
  Administered 2014-11-07: 10 mL
  Filled 2014-11-07: qty 10

## 2014-11-07 MED ORDER — CYANOCOBALAMIN 1000 MCG/ML IJ SOLN
1000.0000 ug | Freq: Once | INTRAMUSCULAR | Status: AC
  Administered 2014-11-07: 1000 ug via INTRAMUSCULAR

## 2014-11-07 MED ORDER — PEMETREXED DISODIUM CHEMO INJECTION 500 MG
490.0000 mg/m2 | Freq: Once | INTRAVENOUS | Status: AC
  Administered 2014-11-07: 900 mg via INTRAVENOUS
  Filled 2014-11-07: qty 36

## 2014-11-07 MED ORDER — SODIUM CHLORIDE 0.9 % IV SOLN
Freq: Once | INTRAVENOUS | Status: AC
  Administered 2014-11-07: 12:00:00 via INTRAVENOUS
  Filled 2014-11-07: qty 8

## 2014-11-07 NOTE — Patient Instructions (Signed)
[facility] Discharge Instructions for Patients Receiving Chemotherapy  Today you received the following chemotherapy agents: Alimta and Carboplatin  To help prevent nausea and vomiting after your treatment, we encourage you to take your nausea medication as directed.   If you develop nausea and vomiting that is not controlled by your nausea medication, call the clinic.   BELOW ARE SYMPTOMS THAT SHOULD BE REPORTED IMMEDIATELY:  *FEVER GREATER THAN 100.5 F  *CHILLS WITH OR WITHOUT FEVER  NAUSEA AND VOMITING THAT IS NOT CONTROLLED WITH YOUR NAUSEA MEDICATION  *UNUSUAL SHORTNESS OF BREATH  *UNUSUAL BRUISING OR BLEEDING  TENDERNESS IN MOUTH AND THROAT WITH OR WITHOUT PRESENCE OF ULCERS  *URINARY PROBLEMS  *BOWEL PROBLEMS  UNUSUAL RASH Items with * indicate a potential emergency and should be followed up as soon as possible.  Feel free to call the clinic you have any questions or concerns. The clinic phone number is [phone].  Please show the [facility] at check-in to the Emergency Department and triage nurse.

## 2014-11-07 NOTE — Progress Notes (Signed)
[place] [phone]   [phone]) [phone]   OFFICE PROGRESS NOTE  REFERRING PHYSICIAN: Dr. Vernell Leep  DIAGNOSIS: stage IV (T3, N2, M1 B) non-small cell lung cancer, adenocarcinoma presented with large right lung mass in addition to mediastinal lymphadenopathy and metastatic lesion in the right thigh diagnosed in December 2015.  CURRENT THERAPY: Systemic chemotherapy with carboplatin AUC of 5 and Alimta 500 mg/m started on 09/05/2014.  HPI Brendan Hooper is a 69 y.o. male is seen for routine follow-up prior to cycle 3 of his chemotherapy. He feels well today is ready to continue his chemotherapy. He denied having any significant chest pain, shortness of breath but continues to have cough with no hemoptysis. The patient has occasional headache but no visual changes. He denied having any significant nausea, vomiting or change in his bowel movements. Right rib pain has improved and he uses oxycodone only intermittently. States that he has been taking his folic acid as directed.  HPI  Past Medical History  Diagnosis Date  . Hypertension   . GERD (gastroesophageal reflux disease)   . Headache(784.0)   . Sickle cell anemia   . 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 Lung mass   . 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 No past surgeries    . Portacath placement Left 09/01/2014    Procedure: INSERTION PORT-A-CATH;  Surgeon: Melrose Nakayama, MD;  Location: [facility] OR;  Service: Thoracic;  Laterality: Left;    Family History  Problem Relation Age of Onset  . Cancer Mother   . Stroke Father     Social History History  Substance Use Topics  . Smoking status: Current Every Day Smoker -- 0.25 packs/day for 40 years    Types: Cigarettes  . Smokeless tobacco: Never Used  . Alcohol Use: 0.0 oz/week    0 Standard drinks or equivalent per week     Comment: social     No Known Allergies  Current Outpatient Prescriptions  Medication Sig Dispense Refill  . dexamethasone (DECADRON) 4 MG tablet Take 0.5 tablets (2 mg total) by mouth 3 (three) times daily. Take 2 mg (1/2 pill) 2 times daily for 1 week, then 2 mg (1/2 pill) once daily for one week, then stop. 15 tablet 0  . dexamethasone (DECADRON) 4 MG tablet 4 mg by mouth twice a day the day before, day of and day after the chemotherapy every 3 weeks 40 tablet 1  . folic acid (FOLVITE) 1 MG tablet Take 1 tablet (1 mg total) by mouth daily. 30 tablet 0  . lidocaine-prilocaine (EMLA) cream Apply 1 application topically as needed. 30 g 1  . Multiple Vitamin (MULTIVITAMIN WITH MINERALS) TABS tablet Take 1 tablet by mouth daily. 30 tablet 0  . nicotine (NICODERM CQ - DOSED IN MG/24 HOURS) 21 mg/24hr patch Place 1 patch (21 mg total) onto the skin daily. 28 patch 0  . oxyCODONE (OXY IR/ROXICODONE) 5 MG immediate release tablet Take 1-2 tablets (5-10 mg total) by mouth every 4 (four) hours as needed for moderate pain. 45 tablet 0  . prochlorperazine (COMPAZINE) 10 MG tablet Take 1 tablet (10 mg total) by mouth every 6 (six) hours as needed for nausea or vomiting. 60 tablet 0  . thiamine 100 MG tablet Take 1 tablet (100 mg total) by mouth daily. 30 tablet 0   No current  facility-administered medications for this visit.   Facility-Administered Medications Ordered in Other Visits  Medication Dose Route Frequency Provider Last Rate Last Dose  . sodium chloride 0.9 % injection 10 mL  10 mL Intracatheter PRN Curt Bears, MD   10 mL at 11/07/14 1354    Review of Systems  Constitutional: positive for fatigue Eyes: negative Ears, nose, mouth, throat, and face: negative Respiratory: positive for cough Cardiovascular: negative Gastrointestinal: negative Genitourinary:negative Integument/breast: negative Hematologic/lymphatic: negative Musculoskeletal:positive for bone pain Neurological:  negative Behavioral/Psych: negative Endocrine: negative Allergic/Immunologic: negative  Physical Exam  WUJ:WJXBJ, healthy, no distress, well nourished and well developed SKIN: skin color, texture, turgor are normal, no rashes or significant lesions HEAD: Normocephalic, No masses, lesions, tenderness or abnormalities EYES: normal, PERRLA, Conjunctiva are pink and non-injected EARS: External ears normal, Canals clear OROPHARYNX:no exudate, no erythema and lips, buccal mucosa, and tongue normal  NECK: supple, no adenopathy, no JVD LYMPH:  no palpable lymphadenopathy, no hepatosplenomegaly LUNGS: clear to auscultation , and palpation HEART: regular rate & rhythm and no murmurs ABDOMEN:abdomen soft, non-tender, normal bowel sounds and no masses or organomegaly BACK: Back symmetric, no curvature., No CVA tenderness EXTREMITIES:no joint deformities, effusion, or inflammation, no edema, no skin discoloration  NEURO: alert & oriented x 3 with fluent speech, no focal motor/sensory deficits  PERFORMANCE STATUS: ECOG 1  LABORATORY DATA: Lab Results  Component Value Date   WBC 12.0* 11/07/2014   HGB 11.6* 11/07/2014   HCT 33.6* 11/07/2014   MCV 79.2* 11/07/2014   PLT 443 large noted* 11/07/2014      Chemistry      Component Value Date/Time   NA 141 11/07/2014 1020   NA 141 09/01/2014 0639   K 3.5 11/07/2014 1020   K 5.0 09/01/2014 0639   CL 101 09/01/2014 0639   CO2 26 11/07/2014 1020   CO2 25 09/01/2014 0639   BUN 8.1 11/07/2014 1020   BUN 6 09/01/2014 0639   CREATININE 0.7 11/07/2014 1020   CREATININE 0.80 09/01/2014 0639      Component Value Date/Time   CALCIUM 9.5 11/07/2014 1020   CALCIUM 9.9 09/01/2014 0639   ALKPHOS 69 11/07/2014 1020   ALKPHOS 57 09/01/2014 0639   AST 14 11/07/2014 1020   AST 23 09/01/2014 0639   ALT 14 11/07/2014 1020   ALT 10 09/01/2014 0639   BILITOT 0.21 11/07/2014 1020   BILITOT 0.6 09/01/2014 0639       RADIOGRAPHIC STUDIES: No  results found.  ASSESSMENT/PLAN: This is a very pleasant 69 years old African-American male recently diagnosed with stage IV (T3, N2, M1 B) non-small cell lung cancer, adenocarcinoma presented with large right lung mass in addition to mediastinal lymphadenopathy and metastatic lesion in the right thigh diagnosed in December 2015. Foundation one results demonstrate positive for K-ras G12C, STK11 loss exons 2-4 NFKBIA amplification, NKX2-1 amplification, & TP53 K291*, Y782N and negative for ALK, EGFR, BRAF, MET, RET, ERBB2.  The patient was seen and examined with Dr. [name]. The patient will proceed with cycle 3 of carboplatin AUC of 5 and Alimta 500 MG/M2. He has tolerated this well so far. We have again reminded him to take his folic acid on a daily basis. I have confirmed with him that he does have Compazine at home.  He will have weekly labs. The patient will return in 3 weeks prior to cycle 4 of his chemotherapy. We will obtain restaging CT scans just prior to cycle 4 of his chemotherapy.   The patient was  advised to call immediately if he has any concerning symptoms in the interval. All questions were answered. The patient knows to call the clinic with any problems, questions or concerns. We can certainly see the patient much sooner if necessary.   Mikey Bussing 11/07/2014, 4:33 PM  ADDENDUM: Hematology/Oncology Attending: I had a face to face encounter with the patient. I recommended his care plan. This is a very pleasant 69 years old African-American male with stage IV non-small cell lung cancer, adenocarcinoma who is currently undergoing systemic chemotherapy with carboplatin and Alimta status post 2 cycles. He tolerated the second cycle of his treatment fairly well with no significant adverse effects. The patient denied having any nausea or vomiting, no fever or chills. We will proceed with cycle #3 today as a scheduled. The patient would come back for follow-up visit in 3 weeks with  repeat CT scan of the chest, abdomen and pelvis for restaging of his disease before starting cycle #4. He was advised to call immediately if he has any concerning symptoms in the interval.  Disclaimer: This note was dictated with voice recognition software. Similar sounding words can inadvertently be transcribed and may be missed upon review. Eilleen Kempf., MD 11/07/2014

## 2014-11-07 NOTE — Progress Notes (Signed)
Nutrition follow-up completed with patient during chemotherapy for lung cancer.  Patient reports oral intake and appetite have improved. Weight increased and documented as 156 pounds March 21.  This is improved from 153 pounds February 29. Patient continues to drink ensure. Patient denies nutrition impact symptoms.  Nutrition diagnosis: food and nutrition related knowledge deficit improved.  Intervention:  Patient educated to continue strategies for increased oral intake to promote weight maintenance. Provided coupons for oral nutrition supplements. Teach back method used.  Monitoring, evaluation, goals: Patient will consume adequate calories and protein for weight maintenance.  Next visit: Monday, April 11, during infusion.  **Disclaimer: This note was dictated with voice recognition software. Similar sounding words can inadvertently be transcribed and this note may contain transcription errors which may not have been corrected upon publication of note.**

## 2014-11-09 ENCOUNTER — Telehealth: Payer: Self-pay | Admitting: *Deleted

## 2014-11-09 NOTE — Progress Notes (Signed)
Radiation Oncology         [phone] ________________________________  Name: Brendan Hooper MRN: [id number]  Date: 11/10/2014  DOB: Mar 14, 1946  Follow-Up Visit Note  CC: [name], MD  Curt Bears, MD  Diagnosis:   69 year old gentleman with numerous brain metastases and postobstructive pneumonia s/p radiotherapy 09/20/2014-2/19/206 when the brain and the lung were treated to 35 Gy in 14 fractions    ICD-9-CM ICD-10-CM   1. Bronchogenic cancer of right lung 162.9 C34.91     Interval Since Last Radiation:  4  weeks  Narrative:  The patient returns today for routine follow-up.  Weight and vitals stable. Denies pain. Reports occasional mild headaches. Denies nausea, vomiting, diplopia or ringing in the ears. Denies difficulty findings words or slurred speech. Denies any episodes of confusion or seizure activity. Reports taking decadron 4 mg once per day. Patient states, "i think i am suppose to only be taking a 1/2 tablet once a day though."                               ALLERGIES:  has No Known Allergies.  Meds: Current Outpatient Prescriptions  Medication Sig Dispense Refill  . dexamethasone (DECADRON) 4 MG tablet Take 0.5 tablets (2 mg total) by mouth 3 (three) times daily. Take 2 mg (1/2 pill) 2 times daily for 1 week, then 2 mg (1/2 pill) once daily for one week, then stop. 15 tablet 0  . folic acid (FOLVITE) 1 MG tablet Take 1 tablet (1 mg total) by mouth daily. 30 tablet 0  . lidocaine-prilocaine (EMLA) cream Apply 1 application topically as needed. 30 g 1  . Multiple Vitamin (MULTIVITAMIN WITH MINERALS) TABS tablet Take 1 tablet by mouth daily. 30 tablet 0  . oxyCODONE (OXY IR/ROXICODONE) 5 MG immediate release tablet Take 1-2 tablets (5-10 mg total) by mouth every 4 (four) hours as needed for moderate pain. 45 tablet 0  . thiamine 100 MG tablet Take 1 tablet (100 mg total) by mouth daily. 30 tablet 0  . dexamethasone (DECADRON) 4 MG tablet 4 mg by mouth twice a day the  day before, day of and day after the chemotherapy every 3 weeks (Patient not taking: Reported on 11/10/2014) 40 tablet 1  . nicotine (NICODERM CQ - DOSED IN MG/24 HOURS) 21 mg/24hr patch Place 1 patch (21 mg total) onto the skin daily. (Patient not taking: Reported on 11/10/2014) 28 patch 0  . prochlorperazine (COMPAZINE) 10 MG tablet Take 1 tablet (10 mg total) by mouth every 6 (six) hours as needed for nausea or vomiting. (Patient not taking: Reported on 11/10/2014) 60 tablet 0   No current facility-administered medications for this encounter.    Physical Findings: The patient is in no acute distress. Patient is alert and oriented.  weight is 151 lb 9.6 oz (68.765 kg). His oral temperature is 98 F (36.7 C). His blood pressure is 122/75 and his pulse is 77. His respiration is 16 and oxygen saturation is 100%. .  No significant changes.  Lab Findings: Lab Results  Component Value Date   WBC 12.0* 11/07/2014   WBC 7.0 09/01/2014   HGB 11.6* 11/07/2014   HGB 13.7 09/01/2014   HCT 33.6* 11/07/2014   HCT 39.4 09/01/2014   PLT 443 large noted* 11/07/2014   PLT 420* 09/01/2014    Lab Results  Component Value Date   NA 141 11/07/2014   NA 141 09/01/2014   K  3.5 11/07/2014   K 5.0 09/01/2014   CHLORIDE 105 11/07/2014   CO2 26 11/07/2014   CO2 25 09/01/2014   GLUCOSE 115 11/07/2014   GLUCOSE 113* 09/01/2014   BUN 8.1 11/07/2014   BUN 6 09/01/2014   CREATININE 0.7 11/07/2014   CREATININE 0.80 09/01/2014   BILITOT 0.21 11/07/2014   BILITOT 0.6 09/01/2014   ALKPHOS 69 11/07/2014   ALKPHOS 57 09/01/2014   AST 14 11/07/2014   AST 23 09/01/2014   ALT 14 11/07/2014   ALT 10 09/01/2014   PROT 6.6 11/07/2014   PROT 7.2 09/01/2014   ALBUMIN 2.9* 11/07/2014   ALBUMIN 2.8* 09/01/2014   CALCIUM 9.5 11/07/2014   CALCIUM 9.9 09/01/2014   ANIONGAP 10 11/07/2014   ANIONGAP 15 09/01/2014    Radiographic Findings: No results found.  Impression:  The patient is recovering from the  effects of radiation.    Plan:  Follow-up with MRI and follow-up at 3 months.  Cut steroids/dexamethasone to 1/2 pill (2 mg) for one week, then stop.  _____________________________________  Sheral Apley. Tammi Klippel, M.D.

## 2014-11-09 NOTE — Telephone Encounter (Signed)
Called patient to follow up regarding smoking cessation.  I was unable to leave a message.

## 2014-11-10 ENCOUNTER — Ambulatory Visit
Admission: RE | Admit: 2014-11-10 | Discharge: 2014-11-10 | Disposition: A | Discharge status: Home / Self Care | Payer: Medicare Other | Source: Ambulatory Visit | Attending: Radiation Oncology | Admitting: Radiation Oncology

## 2014-11-10 ENCOUNTER — Encounter: Payer: Self-pay | Admitting: Radiation Oncology

## 2014-11-10 VITALS — BP 122/75 | HR 77 | Temp 98.0°F | Resp 16 | Wt 151.6 lb

## 2014-11-10 DIAGNOSIS — C3491 Malignant neoplasm of unspecified part of right bronchus or lung: Secondary | ICD-10-CM

## 2014-11-10 NOTE — Patient Instructions (Signed)
Cut steroids/dexamethasone to 1/2 pill (2 mg) for one week, then stop.

## 2014-11-10 NOTE — Progress Notes (Signed)
Weight and vitals stable. Denies pain. Reports occasional mild headaches. Denies nausea, vomiting, diplopia or ringing in the ears. Denies difficulty findings words or slurred speech. Denies any episodes of confusion or seizure activity. Reports taking decadron 4 mg once per day. Patient states, "i think i am suppose to only be taking a 1/2 tablet once a day though." Will clarify taper with Dr. Tammi Klippel.

## 2014-11-11 ENCOUNTER — Telehealth: Payer: Self-pay | Admitting: *Deleted

## 2014-11-11 NOTE — Telephone Encounter (Signed)
Called to follow up and see how patient is doing and check on his smoking cessation.  No answer, unable to leave vm message.

## 2014-11-14 ENCOUNTER — Other Ambulatory Visit (HOSPITAL_BASED_OUTPATIENT_CLINIC_OR_DEPARTMENT_OTHER): Payer: Medicare Other

## 2014-11-14 DIAGNOSIS — C3491 Malignant neoplasm of unspecified part of right bronchus or lung: Secondary | ICD-10-CM

## 2014-11-14 DIAGNOSIS — C3411 Malignant neoplasm of upper lobe, right bronchus or lung: Secondary | ICD-10-CM

## 2014-11-14 LAB — CBC WITH DIFFERENTIAL/PLATELET
BASO%: 0.2 % (ref 0.0–2.0)
Basophils Absolute: 0 10*3/uL (ref 0.0–0.1)
EOS%: 0.4 % (ref 0.0–7.0)
Eosinophils Absolute: 0 10*3/uL (ref 0.0–0.5)
HEMATOCRIT: 34 % — AB (ref 38.4–49.9)
HEMOGLOBIN: 11.7 g/dL — AB (ref 13.0–17.1)
LYMPH#: 0.7 10*3/uL — AB (ref 0.9–3.3)
LYMPH%: 13.1 % — ABNORMAL LOW (ref 14.0–49.0)
MCH: 27.6 pg (ref 27.2–33.4)
MCHC: 34.4 g/dL (ref 32.0–36.0)
MCV: 80.2 fL (ref 79.3–98.0)
MONO#: 0.4 10*3/uL (ref 0.1–0.9)
MONO%: 8.3 % (ref 0.0–14.0)
NEUT#: 4.1 10*3/uL (ref 1.5–6.5)
NEUT%: 78 % — ABNORMAL HIGH (ref 39.0–75.0)
Platelets: 241 10*3/uL (ref 140–400)
RBC: 4.24 10*6/uL (ref 4.20–5.82)
RDW: 21.9 % — AB (ref 11.0–14.6)
WBC: 5.2 10*3/uL (ref 4.0–10.3)

## 2014-11-14 LAB — COMPREHENSIVE METABOLIC PANEL (CC13)
ALBUMIN: 3.2 g/dL — AB (ref 3.5–5.0)
ALT: 17 U/L (ref 0–55)
ANION GAP: 9 meq/L (ref 3–11)
AST: 17 U/L (ref 5–34)
Alkaline Phosphatase: 70 U/L (ref 40–150)
BUN: 12.5 mg/dL (ref 7.0–26.0)
CO2: 26 mEq/L (ref 22–29)
Calcium: 9.9 mg/dL (ref 8.4–10.4)
Chloride: 102 mEq/L (ref 98–109)
Creatinine: 0.7 mg/dL (ref 0.7–1.3)
GLUCOSE: 101 mg/dL (ref 70–140)
POTASSIUM: 3.7 meq/L (ref 3.5–5.1)
SODIUM: 137 meq/L (ref 136–145)
TOTAL PROTEIN: 7 g/dL (ref 6.4–8.3)
Total Bilirubin: 0.61 mg/dL (ref 0.20–1.20)

## 2014-11-21 ENCOUNTER — Other Ambulatory Visit (HOSPITAL_BASED_OUTPATIENT_CLINIC_OR_DEPARTMENT_OTHER): Payer: Medicare Other

## 2014-11-21 DIAGNOSIS — C3411 Malignant neoplasm of upper lobe, right bronchus or lung: Secondary | ICD-10-CM

## 2014-11-21 DIAGNOSIS — C3491 Malignant neoplasm of unspecified part of right bronchus or lung: Secondary | ICD-10-CM

## 2014-11-21 LAB — CBC WITH DIFFERENTIAL/PLATELET
BASO%: 0.6 % (ref 0.0–2.0)
BASOS ABS: 0.1 10*3/uL (ref 0.0–0.1)
EOS%: 0.3 % (ref 0.0–7.0)
Eosinophils Absolute: 0 10*3/uL (ref 0.0–0.5)
HCT: 35.6 % — ABNORMAL LOW (ref 38.4–49.9)
HEMOGLOBIN: 11.8 g/dL — AB (ref 13.0–17.1)
LYMPH#: 1.1 10*3/uL (ref 0.9–3.3)
LYMPH%: 11.6 % — ABNORMAL LOW (ref 14.0–49.0)
MCH: 27.7 pg (ref 27.2–33.4)
MCHC: 33 g/dL (ref 32.0–36.0)
MCV: 84 fL (ref 79.3–98.0)
MONO#: 0.9 10*3/uL (ref 0.1–0.9)
MONO%: 9.6 % (ref 0.0–14.0)
NEUT#: 7.4 10*3/uL — ABNORMAL HIGH (ref 1.5–6.5)
NEUT%: 77.9 % — ABNORMAL HIGH (ref 39.0–75.0)
Platelets: 199 10*3/uL (ref 140–400)
RBC: 4.24 10*6/uL (ref 4.20–5.82)
RDW: 25.1 % — AB (ref 11.0–14.6)
WBC: 9.5 10*3/uL (ref 4.0–10.3)

## 2014-11-21 LAB — COMPREHENSIVE METABOLIC PANEL (CC13)
ALBUMIN: 3.1 g/dL — AB (ref 3.5–5.0)
ALK PHOS: 69 U/L (ref 40–150)
ALT: 14 U/L (ref 0–55)
AST: 16 U/L (ref 5–34)
Anion Gap: 11 mEq/L (ref 3–11)
BILIRUBIN TOTAL: 0.41 mg/dL (ref 0.20–1.20)
BUN: 8.7 mg/dL (ref 7.0–26.0)
CO2: 27 mEq/L (ref 22–29)
Calcium: 8.9 mg/dL (ref 8.4–10.4)
Chloride: 104 mEq/L (ref 98–109)
Creatinine: 0.6 mg/dL — ABNORMAL LOW (ref 0.7–1.3)
Glucose: 83 mg/dl (ref 70–140)
POTASSIUM: 3.3 meq/L — AB (ref 3.5–5.1)
Sodium: 142 mEq/L (ref 136–145)
Total Protein: 6.3 g/dL — ABNORMAL LOW (ref 6.4–8.3)

## 2014-11-21 LAB — TECHNOLOGIST REVIEW

## 2014-11-24 ENCOUNTER — Ambulatory Visit (HOSPITAL_COMMUNITY): Payer: Medicare Other

## 2014-11-28 ENCOUNTER — Ambulatory Visit (HOSPITAL_BASED_OUTPATIENT_CLINIC_OR_DEPARTMENT_OTHER): Payer: Medicare Other | Admitting: Internal Medicine

## 2014-11-28 ENCOUNTER — Encounter: Payer: Self-pay | Admitting: *Deleted

## 2014-11-28 ENCOUNTER — Encounter: Payer: Self-pay | Admitting: Internal Medicine

## 2014-11-28 ENCOUNTER — Ambulatory Visit: Payer: Medicare Other | Admitting: Nutrition

## 2014-11-28 ENCOUNTER — Ambulatory Visit (HOSPITAL_BASED_OUTPATIENT_CLINIC_OR_DEPARTMENT_OTHER): Payer: Medicare Other

## 2014-11-28 ENCOUNTER — Telehealth: Payer: Self-pay | Admitting: Internal Medicine

## 2014-11-28 ENCOUNTER — Other Ambulatory Visit (HOSPITAL_BASED_OUTPATIENT_CLINIC_OR_DEPARTMENT_OTHER): Payer: Medicare Other

## 2014-11-28 VITALS — BP 135/87 | HR 85 | Temp 97.7°F | Resp 18 | Ht 72.0 in | Wt 161.8 lb

## 2014-11-28 DIAGNOSIS — C3411 Malignant neoplasm of upper lobe, right bronchus or lung: Secondary | ICD-10-CM

## 2014-11-28 DIAGNOSIS — C3491 Malignant neoplasm of unspecified part of right bronchus or lung: Secondary | ICD-10-CM

## 2014-11-28 DIAGNOSIS — Z720 Tobacco use: Secondary | ICD-10-CM

## 2014-11-28 DIAGNOSIS — C774 Secondary and unspecified malignant neoplasm of inguinal and lower limb lymph nodes: Secondary | ICD-10-CM

## 2014-11-28 DIAGNOSIS — Z5111 Encounter for antineoplastic chemotherapy: Secondary | ICD-10-CM | POA: Diagnosis not present

## 2014-11-28 LAB — CBC WITH DIFFERENTIAL/PLATELET
BASO%: 0.1 % (ref 0.0–2.0)
Basophils Absolute: 0 10*3/uL (ref 0.0–0.1)
EOS%: 0 % (ref 0.0–7.0)
Eosinophils Absolute: 0 10*3/uL (ref 0.0–0.5)
HCT: 34.6 % — ABNORMAL LOW (ref 38.4–49.9)
HGB: 11.9 g/dL — ABNORMAL LOW (ref 13.0–17.1)
LYMPH%: 8.2 % — AB (ref 14.0–49.0)
MCH: 28.7 pg (ref 27.2–33.4)
MCHC: 34.4 g/dL (ref 32.0–36.0)
MCV: 83.6 fL (ref 79.3–98.0)
MONO#: 0.7 10*3/uL (ref 0.1–0.9)
MONO%: 7.6 % (ref 0.0–14.0)
NEUT#: 7.4 10*3/uL — ABNORMAL HIGH (ref 1.5–6.5)
NEUT%: 84.1 % — AB (ref 39.0–75.0)
PLATELETS: 321 10*3/uL (ref 140–400)
RBC: 4.14 10*6/uL — ABNORMAL LOW (ref 4.20–5.82)
RDW: 22.6 % — ABNORMAL HIGH (ref 11.0–14.6)
WBC: 8.8 10*3/uL (ref 4.0–10.3)
lymph#: 0.7 10*3/uL — ABNORMAL LOW (ref 0.9–3.3)

## 2014-11-28 LAB — COMPREHENSIVE METABOLIC PANEL (CC13)
ALK PHOS: 65 U/L (ref 40–150)
ALT: 13 U/L (ref 0–55)
AST: 16 U/L (ref 5–34)
Albumin: 3.1 g/dL — ABNORMAL LOW (ref 3.5–5.0)
Anion Gap: 11 mEq/L (ref 3–11)
BUN: 10.5 mg/dL (ref 7.0–26.0)
CO2: 25 mEq/L (ref 22–29)
Calcium: 9.5 mg/dL (ref 8.4–10.4)
Chloride: 106 mEq/L (ref 98–109)
Creatinine: 0.6 mg/dL — ABNORMAL LOW (ref 0.7–1.3)
EGFR: >90 mL/min/{1.73_m2} (ref >90)
Glucose: 107 mg/dl (ref 70–140)
Potassium: 3.7 mEq/L (ref 3.5–5.1)
SODIUM: 142 meq/L (ref 136–145)
Total Bilirubin: 0.35 mg/dL (ref 0.20–1.20)
Total Protein: 6.6 g/dL (ref 6.4–8.3)

## 2014-11-28 MED ORDER — SODIUM CHLORIDE 0.9 % IV SOLN
Freq: Once | INTRAVENOUS | Status: AC
  Administered 2014-11-28: 14:00:00 via INTRAVENOUS
  Filled 2014-11-28: qty 8

## 2014-11-28 MED ORDER — SODIUM CHLORIDE 0.9 % IV SOLN
452.0000 mg | Freq: Once | INTRAVENOUS | Status: AC
  Administered 2014-11-28: 450 mg via INTRAVENOUS
  Filled 2014-11-28: qty 45

## 2014-11-28 MED ORDER — HEPARIN SOD (PORK) LOCK FLUSH 100 UNIT/ML IV SOLN
500.0000 [IU] | Freq: Once | INTRAVENOUS | Status: AC | PRN
  Administered 2014-11-28: 500 [IU]
  Filled 2014-11-28: qty 5

## 2014-11-28 MED ORDER — SODIUM CHLORIDE 0.9 % IV SOLN
Freq: Once | INTRAVENOUS | Status: AC
  Administered 2014-11-28: 13:00:00 via INTRAVENOUS

## 2014-11-28 MED ORDER — SODIUM CHLORIDE 0.9 % IV SOLN
490.0000 mg/m2 | Freq: Once | INTRAVENOUS | Status: AC
  Administered 2014-11-28: 900 mg via INTRAVENOUS
  Filled 2014-11-28: qty 36

## 2014-11-28 MED ORDER — SODIUM CHLORIDE 0.9 % IJ SOLN
10.0000 mL | INTRAMUSCULAR | Status: DC | PRN
  Administered 2014-11-28: 10 mL
  Filled 2014-11-28: qty 10

## 2014-11-28 NOTE — Telephone Encounter (Signed)
Appointments made and avs pritned for patient,contrast given

## 2014-11-28 NOTE — Progress Notes (Signed)
Nutrition follow-up completed with patient during chemotherapy for lung cancer.  Patient reports oral intake and appetite continue to improve. Weight increased and documented as 161.8 pounds April 11 from 156 pounds March 21. Patient is drinking ensure and requests coupons. Patient denies nutrition impact symptoms.  Nutrition diagnosis: Food and nutrition related knowledge deficit resolved.  Provided support and encouragement for patient to continue strategies for adequate calories and protein. Provided coupons for oral nutrition supplements. Teach back method used.  Patient will continue to tolerate adequate calories and protein to promote maintenance/gain.  No follow-up scheduled.  Patient has my contact information.

## 2014-11-28 NOTE — Patient Instructions (Signed)
You Can Quit Smoking If you are ready to quit smoking or are thinking about it, congratulations! You have chosen to help yourself be healthier and live longer! There are lots of different ways to quit smoking. Nicotine gum, nicotine patches, a nicotine inhaler, or nicotine nasal spray can help with physical craving. Hypnosis, support groups, and medicines help break the habit of smoking. TIPS TO GET OFF AND STAY OFF CIGARETTES  Learn to predict your moods. Do not let a bad situation be your excuse to have a cigarette. Some situations in your life might tempt you to have a cigarette.  Ask friends and co-workers not to smoke around you.  Make your home smoke-free.  Never have "just one" cigarette. It leads to wanting another and another. Remind yourself of your decision to quit.  On a card, make a list of your reasons for not smoking. Read it at least the same number of times a day as you have a cigarette. Tell yourself everyday, "I do not want to smoke. I choose not to smoke."  Ask someone at home or work to help you with your plan to quit smoking.  Have something planned after you eat or have a cup of coffee. Take a walk or get other exercise to perk you up. This will help to keep you from overeating.  Try a relaxation exercise to calm you down and decrease your stress. Remember, you may be tense and nervous the first two weeks after you quit. This will pass.  Find new activities to keep your hands busy. Play with a pen, coin, or rubber band. Doodle or draw things on paper.  Brush your teeth right after eating. This will help cut down the craving for the taste of tobacco after meals. You can try mouthwash too.  Try gum, breath mints, or diet candy to keep something in your mouth. IF YOU SMOKE AND WANT TO QUIT:  Do not stock up on cigarettes. Never buy a carton. Wait until one pack is finished before you buy another.  Never carry cigarettes with you at work or at home.  Keep cigarettes  as far away from you as possible. Leave them with someone else.  Never carry matches or a lighter with you.  Ask yourself, "Do I need this cigarette or is this just a reflex?"  Bet with someone that you can quit. Put cigarette money in a piggy bank every morning. If you smoke, you give up the money. If you do not smoke, by the end of the week, you keep the money.  Keep trying. It takes 21 days to change a habit!  Talk to your doctor about using medicines to help you quit. These include nicotine replacement gum, lozenges, or skin patches. Document Released: 06/01/2009 Document Revised: 10/28/2011 Document Reviewed: 06/01/2009 Exit[employer] Patient Information 2015 [employer]. This information is not intended to replace advice given to you by your health care provider. Make sure you discuss any questions you have with your health care provider.  

## 2014-11-28 NOTE — [facility] Oncology Navigator Note (Unsigned)
Spoke with patient today about smoking cessation.  I gave him educational material about smoking cessation.  I reviewed with him and his brother.  He stated he quit one time before and will try again.  He stated that he would just stop smoking without any treatment.  I asked that he call me at the cancer center if he was unsuccessful.

## 2014-11-28 NOTE — Progress Notes (Signed)
[place] Telephone:([phone]) [phone]   Fax:(336) [phone]  OFFICE PROGRESS NOTE  [name], MD [place] [address]  DIAGNOSIS: stage IV (T3, N2, M1 B) non-small cell lung cancer, adenocarcinoma presented with large right lung mass in addition to mediastinal lymphadenopathy and metastatic lesion in the right thigh diagnosed in December 2015.  PRIOR THERAPY: None.  CURRENT THERAPY:Systemic chemotherapy with carboplatin AUC of 5 and Alimta 500 mg/m started on 09/05/2014.  INTERVAL HISTORY: Brendan Hooper 69 y.o. male returns to the clinic today for follow-up visit accompanied by family member. The patient is tolerating his current systemic chemotherapy with carboplatin and Alimta fairly well with no significant adverse effects. He denied having any significant nausea or vomiting, no fever or chills. He has no chest pain but continues to have shortness of breath with exertion with no cough or hemoptysis. The patient denied having any significant weight loss or night sweats. He was supposed to have repeat CT scan of the chest, abdomen and pelvis last week but unfortunately he missed his appointment. He is here today to start cycle #4 of her systemic therapy.  MEDICAL HISTORY: Past Medical History  Diagnosis Date  . Hypertension   . GERD (gastroesophageal reflux disease)   . Headache(784.0)   . Sickle cell anemia   . 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 Lung mass   . 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  MEDICATIONS:  Current Outpatient Prescriptions  Medication Sig Dispense Refill  . dexamethasone (DECADRON) 4 MG tablet 4 mg by mouth twice a day the day before, day of and day after the chemotherapy every 3 weeks 40 tablet 1  . folic acid (FOLVITE) 1 MG tablet Take 1 tablet (1 mg total) by mouth daily. 30 tablet 0  . lidocaine-prilocaine (EMLA) cream Apply 1 application  topically as needed. 30 g 1  . Multiple Vitamin (MULTIVITAMIN WITH MINERALS) TABS tablet Take 1 tablet by mouth daily. 30 tablet 0  . oxyCODONE (OXY IR/ROXICODONE) 5 MG immediate release tablet Take 1-2 tablets (5-10 mg total) by mouth every 4 (four) hours as needed for moderate pain. 45 tablet 0  . thiamine 100 MG tablet Take 1 tablet (100 mg total) by mouth daily. 30 tablet 0  . nicotine (NICODERM CQ - DOSED IN MG/24 HOURS) 21 mg/24hr patch Place 1 patch (21 mg total) onto the skin daily. (Patient not taking: Reported on 11/10/2014) 28 patch 0  . prochlorperazine (COMPAZINE) 10 MG tablet Take 1 tablet (10 mg total) by mouth every 6 (six) hours as needed for nausea or vomiting. (Patient not taking: Reported on 11/10/2014) 60 tablet 0   No current facility-administered medications for this visit.    SURGICAL HISTORY:  Past Surgical History  Procedure Laterality Date  . No past surgeries    . Portacath placement Left 09/01/2014    Procedure: INSERTION PORT-A-CATH;  Surgeon: Melrose Nakayama, MD;  Location: [facility];  Service: Thoracic;  Laterality: Left;    REVIEW OF SYSTEMS:  Constitutional: negative Eyes: negative Ears, nose, mouth, throat, and face: negative Respiratory: positive for dyspnea on exertion Cardiovascular: negative Gastrointestinal: negative Genitourinary:negative Integument/breast: negative Hematologic/lymphatic: negative Musculoskeletal:negative Neurological: negative Behavioral/Psych: negative Endocrine: negative Allergic/Immunologic: negative   PHYSICAL EXAMINATION: General appearance: alert, cooperative and no distress Head: Normocephalic, without obvious abnormality, atraumatic Neck: no adenopathy, no JVD, supple, symmetrical, trachea  midline and thyroid not enlarged, symmetric, no tenderness/mass/nodules Lymph nodes: Cervical, supraclavicular, and axillary nodes normal. Resp: clear to auscultation bilaterally Back: symmetric, no curvature. ROM normal. No CVA  tenderness. Cardio: regular rate and rhythm, S1, S2 normal, no murmur, click, rub or gallop GI: soft, non-tender; bowel sounds normal; no masses,  no organomegaly Extremities: extremities normal, atraumatic, no cyanosis or edema Neurologic: Alert and oriented X 3, normal strength and tone. Normal symmetric reflexes. Normal coordination and gait  ECOG PERFORMANCE STATUS: 1 - Symptomatic but completely ambulatory  Blood pressure 135/87, pulse 85, temperature 97.7 F (36.5 C), temperature source Oral, resp. rate 18, height 6' (1.829 m), weight 161 lb 12.8 oz (73.392 kg), SpO2 100 %.  LABORATORY DATA: Lab Results  Component Value Date   WBC 8.8 11/28/2014   HGB 11.9* 11/28/2014   HCT 34.6* 11/28/2014   MCV 83.6 11/28/2014   PLT 321 11/28/2014      Chemistry      Component Value Date/Time   NA 142 11/28/2014 1052   NA 141 09/01/2014 0639   K 3.7 11/28/2014 1052   K 5.0 09/01/2014 0639   CL 101 09/01/2014 0639   CO2 25 11/28/2014 1052   CO2 25 09/01/2014 0639   BUN 10.5 11/28/2014 1052   BUN 6 09/01/2014 0639   CREATININE 0.6* 11/28/2014 1052   CREATININE 0.80 09/01/2014 0639      Component Value Date/Time   CALCIUM 9.5 11/28/2014 1052   CALCIUM 9.9 09/01/2014 0639   ALKPHOS 65 11/28/2014 1052   ALKPHOS 57 09/01/2014 0639   AST 16 11/28/2014 1052   AST 23 09/01/2014 0639   ALT 13 11/28/2014 1052   ALT 10 09/01/2014 0639   BILITOT 0.35 11/28/2014 1052   BILITOT 0.6 09/01/2014 0639       RADIOGRAPHIC STUDIES: No results found.  ASSESSMENT AND PLAN: This is a very pleasant 69 years old [place] American male with history of stage IV non-small cell lung cancer, adenocarcinoma and currently undergoing systemic chemotherapy was carboplatin and Alimta status post 3 cycles. The patient was supposed to have repeat CT scan of the chest, abdomen and pelvis last week but he missed his appointment. I will arrange for the patient to have his the scan done within the next few  days. We will proceed with cycle #4 today as a scheduled. The patient would come back for follow-up visit in 3 weeks with the start of the next cycle of his systemic therapy. He was advised to call immediately if he has any concerning symptoms in the interval. For smoke cessation, I strongly encouraged the patient to quit smoking and he was seen by the thoracic navigator for smoke cessation counseling. He was advised to call immediately if he has any concerning symptoms in the interval.  The patient voices understanding of current disease status and treatment options and is in agreement with the current care plan.  All questions were answered. The patient knows to call the clinic with any problems, questions or concerns. We can certainly see the patient much sooner if necessary.  Disclaimer: This note was dictated with voice recognition software. Similar sounding words can inadvertently be transcribed and may not be corrected upon review.

## 2014-11-28 NOTE — Patient Instructions (Signed)
[facility] Discharge Instructions for Patients Receiving Chemotherapy  Today you received the following chemotherapy agents Alimta and Carboplatin.  To help prevent nausea and vomiting after your treatment, we encourage you to take your nausea medication as prerscribed.   If you develop nausea and vomiting that is not controlled by your nausea medication, call the clinic.   BELOW ARE SYMPTOMS THAT SHOULD BE REPORTED IMMEDIATELY:  *FEVER GREATER THAN 100.5 F  *CHILLS WITH OR WITHOUT FEVER  NAUSEA AND VOMITING THAT IS NOT CONTROLLED WITH YOUR NAUSEA MEDICATION  *UNUSUAL SHORTNESS OF BREATH  *UNUSUAL BRUISING OR BLEEDING  TENDERNESS IN MOUTH AND THROAT WITH OR WITHOUT PRESENCE OF ULCERS  *URINARY PROBLEMS  *BOWEL PROBLEMS  UNUSUAL RASH Items with * indicate a potential emergency and should be followed up as soon as possible.  Feel free to call the clinic you have any questions or concerns. The clinic phone number is [phone].  Please show the [facility] at check-in to the Emergency Department and triage nurse.

## 2014-11-30 ENCOUNTER — Telehealth: Payer: Self-pay | Admitting: Internal Medicine

## 2014-11-30 NOTE — Telephone Encounter (Signed)
I tried to call this patient as i moved her from [place] to [place] on 5/2,she is a Brendan Hooper patient.  i had to adjust her times and have placed a note on her 4/18 appointment to get a new schedule

## 2014-12-01 ENCOUNTER — Ambulatory Visit (HOSPITAL_COMMUNITY)
Admission: RE | Admit: 2014-12-01 | Discharge: 2014-12-01 | Disposition: A | Discharge status: Home / Self Care | Payer: Medicare Other | Source: Ambulatory Visit | Attending: Oncology | Admitting: Oncology

## 2014-12-01 ENCOUNTER — Encounter (HOSPITAL_COMMUNITY): Payer: Self-pay

## 2014-12-01 DIAGNOSIS — C3491 Malignant neoplasm of unspecified part of right bronchus or lung: Secondary | ICD-10-CM | POA: Insufficient documentation

## 2014-12-01 DIAGNOSIS — Z79899 Other long term (current) drug therapy: Secondary | ICD-10-CM | POA: Insufficient documentation

## 2014-12-01 MED ORDER — IOHEXOL 300 MG/ML  SOLN
100.0000 mL | Freq: Once | INTRAMUSCULAR | Status: AC | PRN
  Administered 2014-12-01: 100 mL via INTRAVENOUS

## 2014-12-05 ENCOUNTER — Other Ambulatory Visit (HOSPITAL_BASED_OUTPATIENT_CLINIC_OR_DEPARTMENT_OTHER): Payer: Medicare Other

## 2014-12-05 DIAGNOSIS — C3491 Malignant neoplasm of unspecified part of right bronchus or lung: Secondary | ICD-10-CM

## 2014-12-05 DIAGNOSIS — C3411 Malignant neoplasm of upper lobe, right bronchus or lung: Secondary | ICD-10-CM

## 2014-12-05 LAB — COMPREHENSIVE METABOLIC PANEL (CC13)
ALT: 12 U/L (ref 0–55)
AST: 16 U/L (ref 5–34)
Albumin: 3.2 g/dL — ABNORMAL LOW (ref 3.5–5.0)
Alkaline Phosphatase: 69 U/L (ref 40–150)
Anion Gap: 13 mEq/L — ABNORMAL HIGH (ref 3–11)
BILIRUBIN TOTAL: 0.54 mg/dL (ref 0.20–1.20)
BUN: 11.3 mg/dL (ref 7.0–26.0)
CO2: 23 mEq/L (ref 22–29)
CREATININE: 0.6 mg/dL — AB (ref 0.7–1.3)
Calcium: 9.3 mg/dL (ref 8.4–10.4)
Chloride: 102 mEq/L (ref 98–109)
Glucose: 101 mg/dl (ref 70–140)
Potassium: 3.4 mEq/L — ABNORMAL LOW (ref 3.5–5.1)
Sodium: 138 mEq/L (ref 136–145)
Total Protein: 6.9 g/dL (ref 6.4–8.3)

## 2014-12-05 LAB — CBC WITH DIFFERENTIAL/PLATELET
BASO%: 1.2 % (ref 0.0–2.0)
Basophils Absolute: 0 10*3/uL (ref 0.0–0.1)
EOS%: 1.8 % (ref 0.0–7.0)
Eosinophils Absolute: 0.1 10*3/uL (ref 0.0–0.5)
HCT: 36.9 % — ABNORMAL LOW (ref 38.4–49.9)
HGB: 12.2 g/dL — ABNORMAL LOW (ref 13.0–17.1)
LYMPH#: 0.9 10*3/uL (ref 0.9–3.3)
LYMPH%: 22.6 % (ref 14.0–49.0)
MCH: 28.5 pg (ref 27.2–33.4)
MCHC: 33 g/dL (ref 32.0–36.0)
MCV: 86.3 fL (ref 79.3–98.0)
MONO#: 0.2 10*3/uL (ref 0.1–0.9)
MONO%: 5.7 % (ref 0.0–14.0)
NEUT#: 2.7 10*3/uL (ref 1.5–6.5)
NEUT%: 68.7 % (ref 39.0–75.0)
Platelets: 234 10*3/uL (ref 140–400)
RBC: 4.28 10*6/uL (ref 4.20–5.82)
RDW: 23 % — ABNORMAL HIGH (ref 11.0–14.6)
WBC: 3.9 10*3/uL — AB (ref 4.0–10.3)

## 2014-12-12 ENCOUNTER — Other Ambulatory Visit (HOSPITAL_BASED_OUTPATIENT_CLINIC_OR_DEPARTMENT_OTHER): Payer: Medicare Other

## 2014-12-12 DIAGNOSIS — C3411 Malignant neoplasm of upper lobe, right bronchus or lung: Secondary | ICD-10-CM | POA: Diagnosis not present

## 2014-12-12 DIAGNOSIS — C3491 Malignant neoplasm of unspecified part of right bronchus or lung: Secondary | ICD-10-CM

## 2014-12-12 LAB — CBC WITH DIFFERENTIAL/PLATELET
BASO%: 0.2 % (ref 0.0–2.0)
Basophils Absolute: 0 10*3/uL (ref 0.0–0.1)
EOS%: 0.9 % (ref 0.0–7.0)
Eosinophils Absolute: 0 10*3/uL (ref 0.0–0.5)
HEMATOCRIT: 36.7 % — AB (ref 38.4–49.9)
HGB: 12.8 g/dL — ABNORMAL LOW (ref 13.0–17.1)
LYMPH%: 18.5 % (ref 14.0–49.0)
MCH: 29.6 pg (ref 27.2–33.4)
MCHC: 34.9 g/dL (ref 32.0–36.0)
MCV: 85 fL (ref 79.3–98.0)
MONO#: 0.6 10*3/uL (ref 0.1–0.9)
MONO%: 13.4 % (ref 0.0–14.0)
NEUT#: 3.2 10*3/uL (ref 1.5–6.5)
NEUT%: 67 % (ref 39.0–75.0)
PLATELETS: 224 10*3/uL (ref 140–400)
RBC: 4.32 10*6/uL (ref 4.20–5.82)
RDW: 19.2 % — ABNORMAL HIGH (ref 11.0–14.6)
WBC: 4.7 10*3/uL (ref 4.0–10.3)
lymph#: 0.9 10*3/uL (ref 0.9–3.3)

## 2014-12-12 LAB — COMPREHENSIVE METABOLIC PANEL (CC13)
ALK PHOS: 79 U/L (ref 40–150)
ALT: 15 U/L (ref 0–55)
AST: 17 U/L (ref 5–34)
Albumin: 3.3 g/dL — ABNORMAL LOW (ref 3.5–5.0)
Anion Gap: 14 mEq/L — ABNORMAL HIGH (ref 3–11)
BUN: 5.3 mg/dL — ABNORMAL LOW (ref 7.0–26.0)
CO2: 22 mEq/L (ref 22–29)
Calcium: 9.7 mg/dL (ref 8.4–10.4)
Chloride: 105 mEq/L (ref 98–109)
Creatinine: 0.7 mg/dL (ref 0.7–1.3)
EGFR: >90 mL/min/{1.73_m2} (ref >90)
Glucose: 116 mg/dl (ref 70–140)
Potassium: 3.5 mEq/L (ref 3.5–5.1)
SODIUM: 140 meq/L (ref 136–145)
Total Bilirubin: 0.45 mg/dL (ref 0.20–1.20)
Total Protein: 6.8 g/dL (ref 6.4–8.3)

## 2014-12-14 ENCOUNTER — Telehealth: Payer: Self-pay | Admitting: *Deleted

## 2014-12-14 DIAGNOSIS — C3491 Malignant neoplasm of unspecified part of right bronchus or lung: Secondary | ICD-10-CM

## 2014-12-14 DIAGNOSIS — C7931 Secondary malignant neoplasm of brain: Secondary | ICD-10-CM

## 2014-12-14 MED ORDER — DEXAMETHASONE 4 MG PO TABS: ORAL_TABLET | ORAL | Status: DC

## 2014-12-14 MED ORDER — OXYCODONE HCL 5 MG PO TABS: 5.0000 mg | ORAL_TABLET | ORAL | Status: DC | PRN

## 2014-12-14 NOTE — Telephone Encounter (Signed)
TC from sister requesting refill on decadron and oxycodone. Oxycodone last filled on 10/31/14. Refills taken care of. Please call sister @ [phone] when prescription is available.

## 2014-12-14 NOTE — Telephone Encounter (Signed)
Prescription ready for pick up. I could not reach his sister.

## 2014-12-15 ENCOUNTER — Other Ambulatory Visit: Payer: Self-pay | Admitting: *Deleted

## 2014-12-15 DIAGNOSIS — R918 Other nonspecific abnormal finding of lung field: Secondary | ICD-10-CM

## 2014-12-15 DIAGNOSIS — F17200 Nicotine dependence, unspecified, uncomplicated: Secondary | ICD-10-CM

## 2014-12-15 DIAGNOSIS — C3491 Malignant neoplasm of unspecified part of right bronchus or lung: Secondary | ICD-10-CM

## 2014-12-15 DIAGNOSIS — C7931 Secondary malignant neoplasm of brain: Secondary | ICD-10-CM

## 2014-12-15 MED ORDER — NICOTINE 21 MG/24HR TD PT24: 21.0000 mg | MEDICATED_PATCH | Freq: Every day | TRANSDERMAL | Status: DC

## 2014-12-15 MED ORDER — PROCHLORPERAZINE MALEATE 10 MG PO TABS
10.0000 mg | ORAL_TABLET | Freq: Four times a day (QID) | ORAL | Status: DC | PRN
Start: 2014-12-15 — End: 2015-01-20

## 2014-12-19 ENCOUNTER — Other Ambulatory Visit (HOSPITAL_BASED_OUTPATIENT_CLINIC_OR_DEPARTMENT_OTHER): Payer: Medicare Other

## 2014-12-19 ENCOUNTER — Encounter: Payer: Self-pay | Admitting: *Deleted

## 2014-12-19 ENCOUNTER — Encounter: Payer: Self-pay | Admitting: Oncology

## 2014-12-19 ENCOUNTER — Ambulatory Visit (HOSPITAL_BASED_OUTPATIENT_CLINIC_OR_DEPARTMENT_OTHER): Payer: Medicare Other | Admitting: Oncology

## 2014-12-19 ENCOUNTER — Ambulatory Visit: Payer: Medicare Other | Admitting: Nurse Practitioner

## 2014-12-19 ENCOUNTER — Other Ambulatory Visit: Payer: Medicare Other

## 2014-12-19 ENCOUNTER — Ambulatory Visit: Payer: Medicare Other

## 2014-12-19 ENCOUNTER — Telehealth: Payer: Self-pay | Admitting: Oncology

## 2014-12-19 ENCOUNTER — Ambulatory Visit (HOSPITAL_BASED_OUTPATIENT_CLINIC_OR_DEPARTMENT_OTHER): Payer: Medicare Other

## 2014-12-19 VITALS — BP 121/76 | HR 93 | Temp 98.1°F | Resp 18 | Ht 72.0 in | Wt 148.9 lb

## 2014-12-19 DIAGNOSIS — C774 Secondary and unspecified malignant neoplasm of inguinal and lower limb lymph nodes: Secondary | ICD-10-CM

## 2014-12-19 DIAGNOSIS — C3491 Malignant neoplasm of unspecified part of right bronchus or lung: Secondary | ICD-10-CM

## 2014-12-19 DIAGNOSIS — R0602 Shortness of breath: Secondary | ICD-10-CM

## 2014-12-19 DIAGNOSIS — C3411 Malignant neoplasm of upper lobe, right bronchus or lung: Secondary | ICD-10-CM

## 2014-12-19 DIAGNOSIS — Z5111 Encounter for antineoplastic chemotherapy: Secondary | ICD-10-CM | POA: Diagnosis not present

## 2014-12-19 LAB — COMPREHENSIVE METABOLIC PANEL (CC13)
ALK PHOS: 79 U/L (ref 40–150)
ALT: 10 U/L (ref 0–55)
AST: 14 U/L (ref 5–34)
Albumin: 3.3 g/dL — ABNORMAL LOW (ref 3.5–5.0)
Anion Gap: 13 mEq/L — ABNORMAL HIGH (ref 3–11)
BUN: 7.6 mg/dL (ref 7.0–26.0)
CALCIUM: 11.1 mg/dL — AB (ref 8.4–10.4)
CO2: 26 meq/L (ref 22–29)
Chloride: 100 mEq/L (ref 98–109)
Creatinine: 0.7 mg/dL (ref 0.7–1.3)
GLUCOSE: 150 mg/dL — AB (ref 70–140)
POTASSIUM: 3.5 meq/L (ref 3.5–5.1)
Sodium: 139 mEq/L (ref 136–145)
Total Bilirubin: 0.38 mg/dL (ref 0.20–1.20)
Total Protein: 7.5 g/dL (ref 6.4–8.3)

## 2014-12-19 LAB — CBC WITH DIFFERENTIAL/PLATELET
BASO%: 0.7 % (ref 0.0–2.0)
Basophils Absolute: 0.1 10*3/uL (ref 0.0–0.1)
EOS%: 0.1 % (ref 0.0–7.0)
Eosinophils Absolute: 0 10*3/uL (ref 0.0–0.5)
HCT: 37.4 % — ABNORMAL LOW (ref 38.4–49.9)
HGB: 12.7 g/dL — ABNORMAL LOW (ref 13.0–17.1)
LYMPH%: 7.6 % — ABNORMAL LOW (ref 14.0–49.0)
MCH: 29.2 pg (ref 27.2–33.4)
MCHC: 34 g/dL (ref 32.0–36.0)
MCV: 85.9 fL (ref 79.3–98.0)
MONO#: 0.4 10*3/uL (ref 0.1–0.9)
MONO%: 5.6 % (ref 0.0–14.0)
NEUT#: 6.4 10*3/uL (ref 1.5–6.5)
NEUT%: 86 % — AB (ref 39.0–75.0)
Platelets: 366 10*3/uL (ref 140–400)
RBC: 4.35 10*6/uL (ref 4.20–5.82)
RDW: 19.6 % — ABNORMAL HIGH (ref 11.0–14.6)
WBC: 7.4 10*3/uL (ref 4.0–10.3)
lymph#: 0.6 10*3/uL — ABNORMAL LOW (ref 0.9–3.3)

## 2014-12-19 MED ORDER — SODIUM CHLORIDE 0.9 % IV SOLN
Freq: Once | INTRAVENOUS | Status: AC
  Administered 2014-12-19: 13:00:00 via INTRAVENOUS
  Filled 2014-12-19: qty 8

## 2014-12-19 MED ORDER — SODIUM CHLORIDE 0.9 % IV SOLN
452.0000 mg | Freq: Once | INTRAVENOUS | Status: AC
  Administered 2014-12-19: 450 mg via INTRAVENOUS
  Filled 2014-12-19: qty 45

## 2014-12-19 MED ORDER — SODIUM CHLORIDE 0.9 % IJ SOLN
10.0000 mL | INTRAMUSCULAR | Status: DC | PRN
  Administered 2014-12-19: 10 mL
  Filled 2014-12-19: qty 10

## 2014-12-19 MED ORDER — SODIUM CHLORIDE 0.9 % IV SOLN
Freq: Once | INTRAVENOUS | Status: AC
  Administered 2014-12-19: 13:00:00 via INTRAVENOUS

## 2014-12-19 MED ORDER — HEPARIN SOD (PORK) LOCK FLUSH 100 UNIT/ML IV SOLN
500.0000 [IU] | Freq: Once | INTRAVENOUS | Status: AC | PRN
  Administered 2014-12-19: 500 [IU]
  Filled 2014-12-19: qty 5

## 2014-12-19 MED ORDER — SODIUM CHLORIDE 0.9 % IV SOLN
490.0000 mg/m2 | Freq: Once | INTRAVENOUS | Status: AC
  Administered 2014-12-19: 900 mg via INTRAVENOUS
  Filled 2014-12-19: qty 36

## 2014-12-19 NOTE — Patient Instructions (Signed)
[facility] Discharge Instructions for Patients Receiving Chemotherapy  Today you received the following chemotherapy agents: Alimta and Carboplatin.  To help prevent nausea and vomiting after your treatment, we encourage you to take your nausea medication as directed.   If you develop nausea and vomiting that is not controlled by your nausea medication, call the clinic.   BELOW ARE SYMPTOMS THAT SHOULD BE REPORTED IMMEDIATELY:  *FEVER GREATER THAN 100.5 F  *CHILLS WITH OR WITHOUT FEVER  NAUSEA AND VOMITING THAT IS NOT CONTROLLED WITH YOUR NAUSEA MEDICATION  *UNUSUAL SHORTNESS OF BREATH  *UNUSUAL BRUISING OR BLEEDING  TENDERNESS IN MOUTH AND THROAT WITH OR WITHOUT PRESENCE OF ULCERS  *URINARY PROBLEMS  *BOWEL PROBLEMS  UNUSUAL RASH Items with * indicate a potential emergency and should be followed up as soon as possible.  Feel free to call the clinic you have any questions or concerns. The clinic phone number is [phone].  Please show the CHEMO ALERT CARD at check-in to the Emergency Department and triage nurse.   

## 2014-12-19 NOTE — [facility] Oncology Navigator Note (Unsigned)
Spoke with patient today during his chemotherapy.  I assessed needs and none noted at this time.  I asked that he call if he needed.  He stated he would.

## 2014-12-19 NOTE — Progress Notes (Signed)
[facility] [phone]   [phone]  OFFICE PROGRESS NOTE  [name], MD [place] [address]  DIAGNOSIS: stage IV (T3, N2, M1 B) non-small cell lung cancer, adenocarcinoma presented with large right lung mass in addition to mediastinal lymphadenopathy and metastatic lesion in the right thigh diagnosed in December 2015.  PRIOR THERAPY: None.  CURRENT THERAPY:Systemic chemotherapy with carboplatin AUC of 5 and Alimta 500 mg/m started on 09/05/2014.  INTERVAL HISTORY: Brendan Hooper 69 y.o. male returns to the clinic today for follow-up visit accompanied by family member. The patient is tolerating his current systemic chemotherapy with carboplatin and Alimta fairly well with no significant adverse effects. He denied having any significant nausea or vomiting, no fever or chills. He has no chest pain but continues to have shortness of breath with exertion with no cough or hemoptysis. The patient denied having any significant weight loss or night sweats. He had a recent CT scan. He is here today to review results and to start cycle # 5 of his systemic therapy.  MEDICAL HISTORY: Past Medical History  Diagnosis Date  . GERD (gastroesophageal reflux disease)   . Headache(784.0)   . Sickle cell anemia   . Lung mass   . Hypertension   . 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  MEDICATIONS:  Current Outpatient Prescriptions  Medication Sig Dispense Refill  . dexamethasone (DECADRON) 4 MG tablet 4 mg by mouth twice a day the day before, day of and day after the chemotherapy every 3 weeks 40 tablet 1  . folic acid (FOLVITE) 1 MG tablet Take 1 tablet (1 mg total) by mouth daily. 30 tablet 0  . lidocaine-prilocaine (EMLA) cream Apply 1 application topically as needed. 30 g 1  . Multiple Vitamin (MULTIVITAMIN WITH MINERALS)  TABS tablet Take 1 tablet by mouth daily. 30 tablet 0  . nicotine (NICODERM CQ - DOSED IN MG/24 HOURS) 21 mg/24hr patch Place 1 patch (21 mg total) onto the skin daily. 28 patch 0  . oxyCODONE (OXY IR/ROXICODONE) 5 MG immediate release tablet Take 1-2 tablets (5-10 mg total) by mouth every 4 (four) hours as needed for moderate pain. 45 tablet 0  . prochlorperazine (COMPAZINE) 10 MG tablet Take 1 tablet (10 mg total) by mouth every 6 (six) hours as needed for nausea or vomiting. 60 tablet 0  . thiamine 100 MG tablet Take 1 tablet (100 mg total) by mouth daily. 30 tablet 0   No current facility-administered medications for this visit.   Facility-Administered Medications Ordered in Other Visits  Medication Dose Route Frequency Provider Last Rate Last Dose  . 0.9 %  sodium chloride infusion   Intravenous Once Curt Bears, MD      . CARBOplatin (PARAPLATIN) 450 mg in sodium chloride 0.9 % 250 mL chemo infusion  450 mg Intravenous Once Curt Bears, MD      . heparin lock flush 100 unit/mL  500 Units Intracatheter Once PRN Curt Bears, MD      . ondansetron (ZOFRAN) 16 mg, dexamethasone (DECADRON) 20 mg in sodium chloride 0.9 % 50 mL IVPB   Intravenous Once Curt Bears, MD      . PEMEtrexed (ALIMTA) 900 mg in sodium chloride 0.9 % 100 mL chemo infusion  490 mg/m2 (Treatment Plan Actual) Intravenous Once [facility],  MD      . sodium chloride 0.9 % injection 10 mL  10 mL Intracatheter PRN Curt Bears, MD        SURGICAL HISTORY:  Past Surgical History  Procedure Laterality Date  . No past surgeries    . Portacath placement Left 09/01/2014    Procedure: INSERTION PORT-A-CATH;  Surgeon: Melrose Nakayama, MD;  Location: [facility];  Service: Thoracic;  Laterality: Left;    REVIEW OF SYSTEMS:  Constitutional: negative Eyes: negative Ears, nose, mouth, throat, and face: negative Respiratory: positive for dyspnea on exertion Cardiovascular: negative Gastrointestinal:  negative Genitourinary:negative Integument/breast: negative Hematologic/lymphatic: negative Musculoskeletal:negative Neurological: negative Behavioral/Psych: negative Endocrine: negative Allergic/Immunologic: negative   PHYSICAL EXAMINATION: General appearance: alert, cooperative and no distress Head: Normocephalic, without obvious abnormality, atraumatic Neck: no adenopathy, no JVD, supple, symmetrical, trachea midline and thyroid not enlarged, symmetric, no tenderness/mass/nodules Lymph nodes: Cervical, supraclavicular, and axillary nodes normal. Resp: clear to auscultation bilaterally Back: symmetric, no curvature. ROM normal. No CVA tenderness. Cardio: regular rate and rhythm, S1, S2 normal, no murmur, click, rub or gallop GI: soft, non-tender; bowel sounds normal; no masses,  no organomegaly Extremities: extremities normal, atraumatic, no cyanosis or edema Neurologic: Alert and oriented X 3, normal strength and tone. Normal symmetric reflexes. Normal coordination and gait  ECOG PERFORMANCE STATUS: 1 - Symptomatic but completely ambulatory  Blood pressure 121/76, pulse 93, temperature 98.1 F (36.7 C), temperature source Oral, resp. rate 18, height 6' (1.829 m), weight 148 lb 14.4 oz (67.541 kg), SpO2 97 %.  LABORATORY DATA: Lab Results  Component Value Date   WBC 7.4 12/19/2014   HGB 12.7* 12/19/2014   HCT 37.4* 12/19/2014   MCV 85.9 12/19/2014   PLT 366 12/19/2014      Chemistry      Component Value Date/Time   NA 139 12/19/2014 1209   NA 141 09/01/2014 0639   K 3.5 12/19/2014 1209   K 5.0 09/01/2014 0639   CL 101 09/01/2014 0639   CO2 26 12/19/2014 1209   CO2 25 09/01/2014 0639   BUN 7.6 12/19/2014 1209   BUN 6 09/01/2014 0639   CREATININE 0.7 12/19/2014 1209   CREATININE 0.80 09/01/2014 0639      Component Value Date/Time   CALCIUM 11.1* 12/19/2014 1209   CALCIUM 9.9 09/01/2014 0639   ALKPHOS 79 12/19/2014 1209   ALKPHOS 57 09/01/2014 0639   AST 14  12/19/2014 1209   AST 23 09/01/2014 0639   ALT 10 12/19/2014 1209   ALT 10 09/01/2014 0639   BILITOT 0.38 12/19/2014 1209   BILITOT 0.6 09/01/2014 0639       RADIOGRAPHIC STUDIES: Ct Chest W Contrast  12/02/2014   CLINICAL DATA:  Lung cancer diagnosed in 07/2014. Chemotherapy in progress.  EXAM: CT CHEST, ABDOMEN, AND PELVIS WITH CONTRAST  TECHNIQUE: Multidetector CT imaging of the chest, abdomen and pelvis was performed following the standard protocol during bolus administration of intravenous contrast.  CONTRAST:  162m OMNIPAQUE IOHEXOL 300 MG/ML  SOLN  COMPARISON:  PET of 09/02/2014.  FINDINGS: CT CHEST FINDINGS  Mediastinum/Nodes: No supraclavicular adenopathy. Aortic and branch vessel atherosclerosis. Normal heart size, without pericardial effusion. Multivessel coronary artery atherosclerosis. No central pulmonary embolism, on this non-dedicated study.  High right paratracheal node measures 9 mm on image 9 versus 10 mm on the prior.  Subcarinal node measures 2.4 cm on image 33 versus 4.2 cm on the prior. No left hilar adenopathy.  Lungs/Pleura: No pleural fluid. Mild centrilobular emphysema. Improved mass  effect upon the right middle lobe bronchus.  Central right middle lobe lung mass measures 7.8 x 6.2 cm on image 34. Compare 10.1 x 9.3 cm on the prior exam. Improved surrounding aeration, likely postobstructive in nature.  Clear left lung.  Musculoskeletal: No new osseous metastasis.  CT ABDOMEN AND PELVIS FINDINGS  Hepatobiliary: Subcapsular anterior segment right liver lobe metastasis measures 3.8 x 3.2 cm on image 54. Compare 4.0 x 3.2 cm on the prior exam. No new hepatic metastasis identified. Well-circumscribed low-density lesions are likely cysts and tiny. Normal gallbladder, without biliary ductal dilatation.  Pancreas: Normal, without mass or ductal dilatation.  Spleen: Normal  Adrenals/Urinary Tract: 2.8 cm right adrenal nodule is unchanged. Similar mild left adrenal nodularity. Bilateral  renal cysts. No hydronephrosis. Normal urinary bladder.  Stomach/Bowel: Normal stomach, without wall thickening. Normal colon and terminal ileum. Normal small bowel.  Vascular/Lymphatic: Aortic and branch vessel atherosclerosis. Necrotic right inguinal node is decreased in size, 2.5 cm today versus 3.3 cm on the prior.  Reproductive: Normal prostate.  Other: No significant free fluid.  Musculoskeletal: New lytic lesion in the right side of the L5 vertebral body on image 91. Left sided L1 lytic lesion is new at 1.0 cm on image 62 of series 2.  IMPRESSION: CT CHEST IMPRESSION  1. Decreased size of right middle lobe lung mass. Decreased mediastinal nodal metastasis. 2. Resolved right-sided pleural effusion. 3. Improved peripheral right middle lobe aeration. 4.  Multivessel coronary artery atherosclerosis.  CT ABDOMEN AND PELVIS IMPRESSION  1. Slight decrease in hepatic and right inguinal nodal metastasis 2. Similar right adrenal metastasis. 3. Progressive osseous metastasis.   Electronically Signed   By: Abigail Miyamoto M.D.   On: 12/02/2014 08:56   Ct Abdomen Pelvis W Contrast  12/02/2014   CLINICAL DATA:  Lung cancer diagnosed in 07/2014. Chemotherapy in progress.  EXAM: CT CHEST, ABDOMEN, AND PELVIS WITH CONTRAST  TECHNIQUE: Multidetector CT imaging of the chest, abdomen and pelvis was performed following the standard protocol during bolus administration of intravenous contrast.  CONTRAST:  114m OMNIPAQUE IOHEXOL 300 MG/ML  SOLN  COMPARISON:  PET of 09/02/2014.  FINDINGS: CT CHEST FINDINGS  Mediastinum/Nodes: No supraclavicular adenopathy. Aortic and branch vessel atherosclerosis. Normal heart size, without pericardial effusion. Multivessel coronary artery atherosclerosis. No central pulmonary embolism, on this non-dedicated study.  High right paratracheal node measures 9 mm on image 9 versus 10 mm on the prior.  Subcarinal node measures 2.4 cm on image 33 versus 4.2 cm on the prior. No left hilar adenopathy.   Lungs/Pleura: No pleural fluid. Mild centrilobular emphysema. Improved mass effect upon the right middle lobe bronchus.  Central right middle lobe lung mass measures 7.8 x 6.2 cm on image 34. Compare 10.1 x 9.3 cm on the prior exam. Improved surrounding aeration, likely postobstructive in nature.  Clear left lung.  Musculoskeletal: No new osseous metastasis.  CT ABDOMEN AND PELVIS FINDINGS  Hepatobiliary: Subcapsular anterior segment right liver lobe metastasis measures 3.8 x 3.2 cm on image 54. Compare 4.0 x 3.2 cm on the prior exam. No new hepatic metastasis identified. Well-circumscribed low-density lesions are likely cysts and tiny. Normal gallbladder, without biliary ductal dilatation.  Pancreas: Normal, without mass or ductal dilatation.  Spleen: Normal  Adrenals/Urinary Tract: 2.8 cm right adrenal nodule is unchanged. Similar mild left adrenal nodularity. Bilateral renal cysts. No hydronephrosis. Normal urinary bladder.  Stomach/Bowel: Normal stomach, without wall thickening. Normal colon and terminal ileum. Normal small bowel.  Vascular/Lymphatic: Aortic and branch vessel atherosclerosis.  Necrotic right inguinal node is decreased in size, 2.5 cm today versus 3.3 cm on the prior.  Reproductive: Normal prostate.  Other: No significant free fluid.  Musculoskeletal: New lytic lesion in the right side of the L5 vertebral body on image 91. Left sided L1 lytic lesion is new at 1.0 cm on image 62 of series 2.  IMPRESSION: CT CHEST IMPRESSION  1. Decreased size of right middle lobe lung mass. Decreased mediastinal nodal metastasis. 2. Resolved right-sided pleural effusion. 3. Improved peripheral right middle lobe aeration. 4.  Multivessel coronary artery atherosclerosis.  CT ABDOMEN AND PELVIS IMPRESSION  1. Slight decrease in hepatic and right inguinal nodal metastasis 2. Similar right adrenal metastasis. 3. Progressive osseous metastasis.   Electronically Signed   By: Abigail Miyamoto M.D.   On: 12/02/2014 08:56     ASSESSMENT AND PLAN: This is a very pleasant 69 year old African American male with history of stage IV non-small cell lung cancer, adenocarcinoma and currently undergoing systemic chemotherapy was carboplatin and Alimta status post 4 cycles.   Patient was seen and examined with Dr. [name]. His recent CT scan results were reviewed which showed continued improvement in his cancer. Recommend that he continue with cycle 5 of carboplatin for an AUC of 5 and Alimta 500 mg meter squared as scheduled today.  The patient would come back for follow-up visit in 3 weeks with the start of the next cycle of his systemic therapy. He was advised to call immediately if he has any concerning symptoms in the interval.   He was advised to call immediately if he has any concerning symptoms in the interval.  The patient voices understanding of current disease status and treatment options and is in agreement with the current care plan.  All questions were answered. The patient knows to call the clinic with any problems, questions or concerns. We can certainly see the patient much sooner if necessary.  Brendan Bussing, DNP, AGPCNP-BC, AOCNP  ADDENDUM: Hematology/Oncology Attending: I had a face to face encounter with the patient. I recommended her care plan. This is a very pleasant 69 years old African-American male with a stage IV non-small cell lung cancer, adenocarcinoma was currently undergoing systemic chemotherapy with carboplatin and Alimta status post 4 cycles. The patient is rating his treatment fairly well was no significant adverse effects. The recent CT scan of the chest, abdomen and pelvis showed improvement of his disease. I discussed the scan results with the patient and his family member today. I recommended for him to continue with his chemotherapy today as scheduled. He would come back for follow-up visit in 3 weeks for reevaluation before starting cycle #6. The patient was advised to call  immediately if he has any concerning symptoms in the interval.  Disclaimer: This note was dictated with voice recognition software. Similar sounding words can inadvertently be transcribed and may be missed upon review. Eilleen Kempf., MD 12/19/2014

## 2014-12-19 NOTE — Telephone Encounter (Signed)
Gave avs & calendar for May. Sent message to schedule treatment. °

## 2014-12-26 ENCOUNTER — Telehealth: Payer: Self-pay | Admitting: *Deleted

## 2014-12-26 ENCOUNTER — Other Ambulatory Visit (HOSPITAL_BASED_OUTPATIENT_CLINIC_OR_DEPARTMENT_OTHER): Payer: Medicare Other

## 2014-12-26 DIAGNOSIS — C3411 Malignant neoplasm of upper lobe, right bronchus or lung: Secondary | ICD-10-CM

## 2014-12-26 DIAGNOSIS — C3491 Malignant neoplasm of unspecified part of right bronchus or lung: Secondary | ICD-10-CM

## 2014-12-26 LAB — CBC WITH DIFFERENTIAL/PLATELET
BASO%: 2 % (ref 0.0–2.0)
BASOS ABS: 0 10*3/uL (ref 0.0–0.1)
EOS ABS: 0 10*3/uL (ref 0.0–0.5)
EOS%: 1.5 % (ref 0.0–7.0)
HCT: 38.2 % — ABNORMAL LOW (ref 38.4–49.9)
HEMOGLOBIN: 13.3 g/dL (ref 13.0–17.1)
LYMPH%: 35.1 % (ref 14.0–49.0)
MCH: 29.4 pg (ref 27.2–33.4)
MCHC: 34.8 g/dL (ref 32.0–36.0)
MCV: 84.3 fL (ref 79.3–98.0)
MONO#: 0.1 10*3/uL (ref 0.1–0.9)
MONO%: 6.3 % (ref 0.0–14.0)
NEUT%: 55.1 % (ref 39.0–75.0)
NEUTROS ABS: 1.1 10*3/uL — AB (ref 1.5–6.5)
PLATELETS: 215 10*3/uL (ref 140–400)
RBC: 4.53 10*6/uL (ref 4.20–5.82)
RDW: 16.8 % — AB (ref 11.0–14.6)
WBC: 2.1 10*3/uL — ABNORMAL LOW (ref 4.0–10.3)
lymph#: 0.7 10*3/uL — ABNORMAL LOW (ref 0.9–3.3)

## 2014-12-26 LAB — COMPREHENSIVE METABOLIC PANEL (CC13)
ALBUMIN: 3.2 g/dL — AB (ref 3.5–5.0)
ALT: 15 U/L (ref 0–55)
AST: 22 U/L (ref 5–34)
Alkaline Phosphatase: 78 U/L (ref 40–150)
Anion Gap: 15 mEq/L — ABNORMAL HIGH (ref 3–11)
BILIRUBIN TOTAL: 0.6 mg/dL (ref 0.20–1.20)
BUN: 8.6 mg/dL (ref 7.0–26.0)
CO2: 26 mEq/L (ref 22–29)
Calcium: 9.9 mg/dL (ref 8.4–10.4)
Chloride: 100 mEq/L (ref 98–109)
Creatinine: 0.7 mg/dL (ref 0.7–1.3)
EGFR: >90 mL/min/{1.73_m2} (ref >90)
Glucose: 113 mg/dl (ref 70–140)
POTASSIUM: 3.2 meq/L — AB (ref 3.5–5.1)
Sodium: 141 mEq/L (ref 136–145)
Total Protein: 7.2 g/dL (ref 6.4–8.3)

## 2014-12-26 NOTE — Telephone Encounter (Signed)
Called pt per MD encouraged K+ rich foods into diet. Discussed with pt examples of these foods. Pt verbalized understanding, voiced he does not have much of an appetite.

## 2014-12-26 NOTE — Telephone Encounter (Signed)
-----   Message from Curt Bears, MD sent at 12/26/2014  1:25 PM EDT ----- Call patient with the result and encourage K rich diet.

## 2014-12-26 NOTE — Progress Notes (Signed)
Quick Note:  Call patient with the result and encourage K rich diet ______ 

## 2015-01-02 ENCOUNTER — Other Ambulatory Visit (HOSPITAL_BASED_OUTPATIENT_CLINIC_OR_DEPARTMENT_OTHER): Payer: Medicare Other

## 2015-01-02 ENCOUNTER — Telehealth: Payer: Self-pay | Admitting: Medical Oncology

## 2015-01-02 ENCOUNTER — Other Ambulatory Visit: Payer: Self-pay | Admitting: Medical Oncology

## 2015-01-02 DIAGNOSIS — C3411 Malignant neoplasm of upper lobe, right bronchus or lung: Secondary | ICD-10-CM

## 2015-01-02 DIAGNOSIS — C3491 Malignant neoplasm of unspecified part of right bronchus or lung: Secondary | ICD-10-CM

## 2015-01-02 DIAGNOSIS — E876 Hypokalemia: Secondary | ICD-10-CM

## 2015-01-02 LAB — CBC WITH DIFFERENTIAL/PLATELET
BASO%: 0.5 % (ref 0.0–2.0)
BASOS ABS: 0 10*3/uL (ref 0.0–0.1)
EOS%: 1 % (ref 0.0–7.0)
Eosinophils Absolute: 0 10*3/uL (ref 0.0–0.5)
HCT: 39.7 % (ref 38.4–49.9)
HEMOGLOBIN: 13.3 g/dL (ref 13.0–17.1)
LYMPH#: 0.8 10*3/uL — AB (ref 0.9–3.3)
LYMPH%: 23.9 % (ref 14.0–49.0)
MCH: 28.9 pg (ref 27.2–33.4)
MCHC: 33.5 g/dL (ref 32.0–36.0)
MCV: 86.2 fL (ref 79.3–98.0)
MONO#: 0.6 10*3/uL (ref 0.1–0.9)
MONO%: 18.8 % — AB (ref 0.0–14.0)
NEUT#: 1.8 10*3/uL (ref 1.5–6.5)
NEUT%: 55.8 % (ref 39.0–75.0)
Platelets: 252 10*3/uL (ref 140–400)
RBC: 4.6 10*6/uL (ref 4.20–5.82)
RDW: 19 % — AB (ref 11.0–14.6)
WBC: 3.2 10*3/uL — ABNORMAL LOW (ref 4.0–10.3)

## 2015-01-02 LAB — COMPREHENSIVE METABOLIC PANEL (CC13)
ALT: 16 U/L (ref 0–55)
AST: 24 U/L (ref 5–34)
Albumin: 3.4 g/dL — ABNORMAL LOW (ref 3.5–5.0)
Alkaline Phosphatase: 85 U/L (ref 40–150)
Anion Gap: 13 mEq/L — ABNORMAL HIGH (ref 3–11)
BUN: 3 mg/dL — ABNORMAL LOW (ref 7.0–26.0)
CHLORIDE: 100 meq/L (ref 98–109)
CO2: 27 meq/L (ref 22–29)
Calcium: 10.2 mg/dL (ref 8.4–10.4)
Creatinine: 0.7 mg/dL (ref 0.7–1.3)
EGFR: >90 mL/min/{1.73_m2} (ref >90)
Glucose: 105 mg/dl (ref 70–140)
Potassium: 2.9 mEq/L — CL (ref 3.5–5.1)
SODIUM: 141 meq/L (ref 136–145)
TOTAL PROTEIN: 7.2 g/dL (ref 6.4–8.3)
Total Bilirubin: 0.41 mg/dL (ref 0.20–1.20)

## 2015-01-02 MED ORDER — POTASSIUM CHLORIDE CRYS ER 20 MEQ PO TBCR: 20.0000 meq | EXTENDED_RELEASE_TABLET | Freq: Two times a day (BID) | ORAL | Status: DC

## 2015-01-02 NOTE — Telephone Encounter (Signed)
-----   Message from Curt Bears, MD sent at 01/02/2015 12:54 PM EDT ----- Call patient with the result and order K Dur 40 meq po qd X 7

## 2015-01-02 NOTE — Progress Notes (Signed)
Quick Note:  Call patient with the result and order K Dur 40 meq po qd X 7 ______ 

## 2015-01-02 NOTE — Telephone Encounter (Signed)
Pt notified and rx sent to [facility].

## 2015-01-09 ENCOUNTER — Ambulatory Visit (HOSPITAL_BASED_OUTPATIENT_CLINIC_OR_DEPARTMENT_OTHER): Payer: Medicare Other | Admitting: Physician Assistant

## 2015-01-09 ENCOUNTER — Other Ambulatory Visit (HOSPITAL_BASED_OUTPATIENT_CLINIC_OR_DEPARTMENT_OTHER): Payer: Medicare Other

## 2015-01-09 ENCOUNTER — Telehealth: Payer: Self-pay | Admitting: Physician Assistant

## 2015-01-09 ENCOUNTER — Ambulatory Visit (HOSPITAL_BASED_OUTPATIENT_CLINIC_OR_DEPARTMENT_OTHER): Payer: Medicare Other

## 2015-01-09 ENCOUNTER — Ambulatory Visit: Payer: Medicare Other

## 2015-01-09 ENCOUNTER — Encounter: Payer: Self-pay | Admitting: Physician Assistant

## 2015-01-09 ENCOUNTER — Telehealth: Payer: Self-pay | Admitting: *Deleted

## 2015-01-09 VITALS — BP 101/66 | HR 95 | Temp 97.5°F | Resp 18 | Ht 72.0 in | Wt 139.7 lb

## 2015-01-09 DIAGNOSIS — C3411 Malignant neoplasm of upper lobe, right bronchus or lung: Secondary | ICD-10-CM

## 2015-01-09 DIAGNOSIS — B370 Candidal stomatitis: Secondary | ICD-10-CM

## 2015-01-09 DIAGNOSIS — C7931 Secondary malignant neoplasm of brain: Secondary | ICD-10-CM

## 2015-01-09 DIAGNOSIS — Z5111 Encounter for antineoplastic chemotherapy: Secondary | ICD-10-CM | POA: Diagnosis not present

## 2015-01-09 DIAGNOSIS — C774 Secondary and unspecified malignant neoplasm of inguinal and lower limb lymph nodes: Secondary | ICD-10-CM

## 2015-01-09 DIAGNOSIS — C3491 Malignant neoplasm of unspecified part of right bronchus or lung: Secondary | ICD-10-CM

## 2015-01-09 LAB — COMPREHENSIVE METABOLIC PANEL (CC13)
ALT: 11 U/L (ref 0–55)
AST: 19 U/L (ref 5–34)
Albumin: 3.4 g/dL — ABNORMAL LOW (ref 3.5–5.0)
Alkaline Phosphatase: 76 U/L (ref 40–150)
Anion Gap: 18 mEq/L — ABNORMAL HIGH (ref 3–11)
BILIRUBIN TOTAL: 0.35 mg/dL (ref 0.20–1.20)
BUN: 8.8 mg/dL (ref 7.0–26.0)
CALCIUM: 10.2 mg/dL (ref 8.4–10.4)
CO2: 20 mEq/L — ABNORMAL LOW (ref 22–29)
Chloride: 99 mEq/L (ref 98–109)
Creatinine: 0.8 mg/dL (ref 0.7–1.3)
EGFR: >90 mL/min/{1.73_m2} (ref >90)
Glucose: 159 mg/dl — ABNORMAL HIGH (ref 70–140)
Potassium: 4.4 mEq/L (ref 3.5–5.1)
Sodium: 136 mEq/L (ref 136–145)
Total Protein: 7.3 g/dL (ref 6.4–8.3)

## 2015-01-09 LAB — CBC WITH DIFFERENTIAL/PLATELET
BASO%: 0 % (ref 0.0–2.0)
Basophils Absolute: 0 10*3/uL (ref 0.0–0.1)
EOS%: 0 % (ref 0.0–7.0)
Eosinophils Absolute: 0 10*3/uL (ref 0.0–0.5)
HEMATOCRIT: 36.5 % — AB (ref 38.4–49.9)
HGB: 12.9 g/dL — ABNORMAL LOW (ref 13.0–17.1)
LYMPH#: 0.5 10*3/uL — AB (ref 0.9–3.3)
LYMPH%: 9.9 % — ABNORMAL LOW (ref 14.0–49.0)
MCH: 28.8 pg (ref 27.2–33.4)
MCHC: 35.3 g/dL (ref 32.0–36.0)
MCV: 81.5 fL (ref 79.3–98.0)
MONO#: 0.2 10*3/uL (ref 0.1–0.9)
MONO%: 3.3 % (ref 0.0–14.0)
NEUT#: 4.2 10*3/uL (ref 1.5–6.5)
NEUT%: 86.8 % — ABNORMAL HIGH (ref 39.0–75.0)
Platelets: 372 10*3/uL (ref 140–400)
RBC: 4.48 10*6/uL (ref 4.20–5.82)
RDW: 16.8 % — AB (ref 11.0–14.6)
WBC: 4.8 10*3/uL (ref 4.0–10.3)

## 2015-01-09 MED ORDER — SODIUM CHLORIDE 0.9 % IV SOLN
Freq: Once | INTRAVENOUS | Status: AC
  Administered 2015-01-09: 13:00:00 via INTRAVENOUS
  Filled 2015-01-09: qty 8

## 2015-01-09 MED ORDER — CYANOCOBALAMIN 1000 MCG/ML IJ SOLN
1000.0000 ug | Freq: Once | INTRAMUSCULAR | Status: AC
  Administered 2015-01-09: 1000 ug via INTRAMUSCULAR

## 2015-01-09 MED ORDER — SODIUM CHLORIDE 0.9 % IV SOLN
452.0000 mg | Freq: Once | INTRAVENOUS | Status: AC
  Administered 2015-01-09: 450 mg via INTRAVENOUS
  Filled 2015-01-09: qty 45

## 2015-01-09 MED ORDER — OXYCODONE HCL 5 MG PO TABS: 5.0000 mg | ORAL_TABLET | ORAL | Status: DC | PRN

## 2015-01-09 MED ORDER — CYANOCOBALAMIN 1000 MCG/ML IJ SOLN
INTRAMUSCULAR | Status: AC
  Filled 2015-01-09: qty 1

## 2015-01-09 MED ORDER — SODIUM CHLORIDE 0.9 % IJ SOLN
10.0000 mL | INTRAMUSCULAR | Status: DC | PRN
  Administered 2015-01-09: 10 mL
  Filled 2015-01-09: qty 10

## 2015-01-09 MED ORDER — SODIUM CHLORIDE 0.9 % IV SOLN
490.0000 mg/m2 | Freq: Once | INTRAVENOUS | Status: AC
  Administered 2015-01-09: 900 mg via INTRAVENOUS
  Filled 2015-01-09: qty 36

## 2015-01-09 MED ORDER — SODIUM CHLORIDE 0.9 % IV SOLN
Freq: Once | INTRAVENOUS | Status: AC
  Administered 2015-01-09: 13:00:00 via INTRAVENOUS

## 2015-01-09 MED ORDER — FLUCONAZOLE 100 MG PO TABS: 100.0000 mg | ORAL_TABLET | Freq: Every day | ORAL | Status: DC

## 2015-01-09 MED ORDER — HEPARIN SOD (PORK) LOCK FLUSH 100 UNIT/ML IV SOLN
500.0000 [IU] | Freq: Once | INTRAVENOUS | Status: AC | PRN
  Administered 2015-01-09: 500 [IU]
  Filled 2015-01-09: qty 5

## 2015-01-09 NOTE — Telephone Encounter (Signed)
Per staff message and POF I have scheduled appts. Advised scheduler of appts. JMW  

## 2015-01-09 NOTE — Progress Notes (Addendum)
[facility] [phone]   [phone]  OFFICE PROGRESS NOTE  [name], MD [place] [address]  DIAGNOSIS: stage IV (T3, N2, M1 B) non-small cell lung cancer, adenocarcinoma presented with large right lung mass in addition to mediastinal lymphadenopathy and metastatic lesion in the right thigh diagnosed in December 2015.  PRIOR THERAPY: None.  CURRENT THERAPY:Systemic chemotherapy with carboplatin AUC of 5 and Alimta 500 mg/m started on 09/05/2014. Status post 5 cycles.  INTERVAL HISTORY: Brendan Hooper 69 y.o. male returns to the clinic today for follow-up visit accompanied by family member. The patient is tolerating his current systemic chemotherapy with carboplatin and Alimta fairly well with no significant adverse effects. He denied having any significant nausea or vomiting, no fever or chills. He does report decreased appetite and decreased by mouth intake, with recent weight loss.He has no chest pain but continues to have shortness of breath with exertion with no cough or hemoptysis. The patient denied having any night sweats. He presents today to proceed with cycle #6. He requests a refill for his pain medication.  MEDICAL HISTORY: Past Medical History  Diagnosis Date  . GERD (gastroesophageal reflux disease)   . Headache(784.0)   . Sickle cell anemia   . Lung mass   . Hypertension   . 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  MEDICATIONS:  Current Outpatient Prescriptions  Medication Sig Dispense Refill  . dexamethasone (DECADRON) 4 MG tablet 4 mg by mouth twice a day the day before, day of and day after the chemotherapy every 3 weeks 40 tablet 1  . folic acid (FOLVITE) 1 MG tablet Take 1 tablet (1 mg total) by mouth daily. 30 tablet 0  . lidocaine-prilocaine (EMLA) cream Apply 1 application topically as  needed. 30 g 1  . Multiple Vitamin (MULTIVITAMIN WITH MINERALS) TABS tablet Take 1 tablet by mouth daily. 30 tablet 0  . nicotine (NICODERM CQ - DOSED IN MG/24 HOURS) 21 mg/24hr patch Place 1 patch (21 mg total) onto the skin daily. 28 patch 0  . oxyCODONE (OXY IR/ROXICODONE) 5 MG immediate release tablet Take 1-2 tablets (5-10 mg total) by mouth every 4 (four) hours as needed for moderate pain. 45 tablet 0  . potassium chloride SA (K-DUR,KLOR-CON) 20 MEQ tablet Take 1 tablet (20 mEq total) by mouth 2 (two) times daily. 14 tablet 0  . prochlorperazine (COMPAZINE) 10 MG tablet Take 1 tablet (10 mg total) by mouth every 6 (six) hours as needed for nausea or vomiting. 60 tablet 0  . thiamine 100 MG tablet Take 1 tablet (100 mg total) by mouth daily. 30 tablet 0  . fluconazole (DIFLUCAN) 100 MG tablet Take 1 tablet (100 mg total) by mouth daily. 14 tablet 0   No current facility-administered medications for this visit.    SURGICAL HISTORY:  Past Surgical History  Procedure Laterality Date  . No past surgeries    . Portacath placement Left 09/01/2014    Procedure: INSERTION PORT-A-CATH;  Surgeon: Melrose Nakayama, MD;  Location: [facility];  Service: Thoracic;  Laterality: Left;    REVIEW OF SYSTEMS:  Constitutional: positive for anorexia and weight loss Eyes: negative Ears, nose, mouth, throat, and face: negative Respiratory: positive for dyspnea on exertion Cardiovascular: negative Gastrointestinal: negative Genitourinary:negative Integument/breast: negative Hematologic/lymphatic: negative Musculoskeletal:negative Neurological: negative  Behavioral/Psych: negative Endocrine: negative Allergic/Immunologic: negative   PHYSICAL EXAMINATION: General appearance: alert, cooperative and no distress Head: Normocephalic, without obvious abnormality, atraumatic Neck: no adenopathy, no JVD, supple, symmetrical, trachea midline and thyroid not enlarged, symmetric, no tenderness/mass/nodules Lymph  nodes: Cervical, supraclavicular, and axillary nodes normal. Resp: clear to auscultation bilaterally Back: symmetric, no curvature. ROM normal. No CVA tenderness. Cardio: regular rate and rhythm, S1, S2 normal, no murmur, click, rub or gallop GI: soft, non-tender; bowel sounds normal; no masses,  no organomegaly Extremities: extremities normal, atraumatic, no cyanosis or edema Neurologic: Alert and oriented X 3, normal strength and tone. Normal symmetric reflexes. Normal coordination and gait Mouth: positive for thrush, no mucousitis  ECOG PERFORMANCE STATUS: 1 - Symptomatic but completely ambulatory  Blood pressure 101/66, pulse 95, temperature 97.5 F (36.4 C), temperature source Oral, resp. rate 18, height 6' (1.829 m), weight 139 lb 11.2 oz (63.368 kg), SpO2 100 %.  LABORATORY DATA: Lab Results  Component Value Date   WBC 4.8 01/09/2015   HGB 12.9* 01/09/2015   HCT 36.5* 01/09/2015   MCV 81.5 01/09/2015   PLT 372 01/09/2015      Chemistry      Component Value Date/Time   NA 136 01/09/2015 1133   NA 141 09/01/2014 0639   K 4.4 01/09/2015 1133   K 5.0 09/01/2014 0639   CL 101 09/01/2014 0639   CO2 20* 01/09/2015 1133   CO2 25 09/01/2014 0639   BUN 8.8 01/09/2015 1133   BUN 6 09/01/2014 0639   CREATININE 0.8 01/09/2015 1133   CREATININE 0.80 09/01/2014 0639      Component Value Date/Time   CALCIUM 10.2 01/09/2015 1133   CALCIUM 9.9 09/01/2014 0639   ALKPHOS 76 01/09/2015 1133   ALKPHOS 57 09/01/2014 0639   AST 19 01/09/2015 1133   AST 23 09/01/2014 0639   ALT 11 01/09/2015 1133   ALT 10 09/01/2014 0639   BILITOT 0.35 01/09/2015 1133   BILITOT 0.6 09/01/2014 0639       RADIOGRAPHIC STUDIES: No results found.  ASSESSMENT AND PLAN: This is a very pleasant 69 year old African American male with history of stage IV non-small cell lung cancer, adenocarcinoma and currently undergoing systemic chemotherapy was carboplatin and Alimta status post 4 cycles.   Patient  was seen and examined with Dr. [name]. His last CT scan results  showed continued improvement in his cancer. Recommend that he continue with cycle 6 of carboplatin for an AUC of 5 and Alimta 500 mg meter squared as scheduled today.  The patient would come back for follow-up visit in 3 weeks with a restaging CT scan of the chest, abdomen and pelvis to re-evaluate his disease. A prescription for diflucan was sent to his pharmacy of record via [facility] to address the oral candidiasis. He was also given a refill prescription for his oxycodone. He was advised to call immediately if he has any concerning symptoms in the interval.   The patient voices understanding of current disease status and treatment options and is in agreement with the current care plan.  All questions were answered. The patient knows to call the clinic with any problems, questions or concerns. We can certainly see the patient much sooner if necessary.  Carlton Adam, PA-C 01/09/2015   ADDENDUM: Hematology/Oncology Attending: I had a face to face encounter with the patient. I recommended his care plan. This is a very pleasant 69 years old African-American male with a stage IV non-small cell lung cancer  currently undergoing systemic chemotherapy with carboplatin and Alimta status post 5 cycles. He is tolerating his treatment fairly well with no significant adverse effects. I recommended for the patient to proceed with cycle #6 today as scheduled. He will come back for follow-up visit in 3 weeks for reevaluation after repeating CT scan of the chest, abdomen and pelvis for restaging of his disease. The patient was giving a prescription for Diflucan for the oral thrush in addition to a refill of his pain medication. He was advised to call immediately if he has any concerning symptoms in the interval.  Disclaimer: This note was dictated with voice recognition software. Similar sounding words can inadvertently be transcribed and may  be missed upon review. Eilleen Kempf., MD 01/16/2015

## 2015-01-09 NOTE — Patient Instructions (Signed)
[facility] Discharge Instructions for Patients Receiving Chemotherapy  Today you received the following chemotherapy agents alimta and carboplatin  To help prevent nausea and vomiting after your treatment, we encourage you to take your nausea medication if needed   If you develop nausea and vomiting that is not controlled by your nausea medication, call the clinic.   BELOW ARE SYMPTOMS THAT SHOULD BE REPORTED IMMEDIATELY:  *FEVER GREATER THAN 100.5 F  *CHILLS WITH OR WITHOUT FEVER  NAUSEA AND VOMITING THAT IS NOT CONTROLLED WITH YOUR NAUSEA MEDICATION  *UNUSUAL SHORTNESS OF BREATH  *UNUSUAL BRUISING OR BLEEDING  TENDERNESS IN MOUTH AND THROAT WITH OR WITHOUT PRESENCE OF ULCERS  *URINARY PROBLEMS  *BOWEL PROBLEMS  UNUSUAL RASH Items with * indicate a potential emergency and should be followed up as soon as possible.  Feel free to call the clinic you have any questions or concerns. The clinic phone number is [phone].  Please show the [facility] at check-in to the Emergency Department and triage nurse.

## 2015-01-09 NOTE — Telephone Encounter (Signed)
Pt confirmed labs/ov per 05/23 POF, gave pt AVS and Calendar.... KJ, sent msg to add chemo

## 2015-01-09 NOTE — Progress Notes (Signed)
Vitamin B 12 injection given by infusion nurse while receiving chemo

## 2015-01-13 NOTE — Patient Instructions (Signed)
Follow up in 3 weeks with a restaging CT scan of your chest, abdomen and pelvis to re-evaluate your disease Continue weekly labs as scheduled

## 2015-01-14 ENCOUNTER — Emergency Department (HOSPITAL_COMMUNITY)
Admission: EM | Admit: 2015-01-14 | Discharge: 2015-01-14 | Disposition: A | Discharge status: Home / Self Care | Payer: Medicare Other | Attending: Emergency Medicine | Admitting: Emergency Medicine

## 2015-01-14 ENCOUNTER — Encounter (HOSPITAL_COMMUNITY): Payer: Self-pay | Admitting: Emergency Medicine

## 2015-01-14 ENCOUNTER — Emergency Department (HOSPITAL_COMMUNITY): Payer: Medicare Other

## 2015-01-14 DIAGNOSIS — D571 Sickle-cell disease without crisis: Secondary | ICD-10-CM | POA: Diagnosis not present

## 2015-01-14 DIAGNOSIS — K5900 Constipation, unspecified: Secondary | ICD-10-CM | POA: Insufficient documentation

## 2015-01-14 DIAGNOSIS — Z79899 Other long term (current) drug therapy: Secondary | ICD-10-CM | POA: Insufficient documentation

## 2015-01-14 DIAGNOSIS — Z85841 Personal history of malignant neoplasm of brain: Secondary | ICD-10-CM | POA: Insufficient documentation

## 2015-01-14 DIAGNOSIS — I10 Essential (primary) hypertension: Secondary | ICD-10-CM | POA: Insufficient documentation

## 2015-01-14 DIAGNOSIS — Z720 Tobacco use: Secondary | ICD-10-CM | POA: Diagnosis not present

## 2015-01-14 DIAGNOSIS — Z85118 Personal history of other malignant neoplasm of bronchus and lung: Secondary | ICD-10-CM | POA: Insufficient documentation

## 2015-01-14 DIAGNOSIS — R1030 Lower abdominal pain, unspecified: Secondary | ICD-10-CM | POA: Insufficient documentation

## 2015-01-14 LAB — URINE MICROSCOPIC-ADD ON

## 2015-01-14 LAB — CBC WITH DIFFERENTIAL/PLATELET
BASOS ABS: 0 10*3/uL (ref 0.0–0.1)
Basophils Relative: 0 % (ref 0–1)
Eosinophils Absolute: 0.1 10*3/uL (ref 0.0–0.7)
Eosinophils Relative: 1 % (ref 0–5)
HCT: 36.8 % — ABNORMAL LOW (ref 39.0–52.0)
Hemoglobin: 12.5 g/dL — ABNORMAL LOW (ref 13.0–17.0)
Lymphocytes Relative: 24 % (ref 12–46)
Lymphs Abs: 0.9 10*3/uL (ref 0.7–4.0)
MCH: 27.7 pg (ref 26.0–34.0)
MCHC: 34 g/dL (ref 30.0–36.0)
MCV: 81.6 fL (ref 78.0–100.0)
MONOS PCT: 3 % (ref 3–12)
Monocytes Absolute: 0.1 10*3/uL (ref 0.1–1.0)
NEUTROS ABS: 2.5 10*3/uL (ref 1.7–7.7)
Neutrophils Relative %: 72 % (ref 43–77)
Platelets: 251 10*3/uL (ref 150–400)
RBC: 4.51 MIL/uL (ref 4.22–5.81)
RDW: 16.5 % — ABNORMAL HIGH (ref 11.5–15.5)
WBC: 3.5 10*3/uL — AB (ref 4.0–10.5)

## 2015-01-14 LAB — URINALYSIS, ROUTINE W REFLEX MICROSCOPIC
Glucose, UA: 100 mg/dL — AB
HGB URINE DIPSTICK: NEGATIVE
Ketones, ur: 40 mg/dL — AB
NITRITE: NEGATIVE
PROTEIN: 100 mg/dL — AB
SPECIFIC GRAVITY, URINE: 1.022 (ref 1.005–1.030)
Urobilinogen, UA: 1 mg/dL (ref 0.0–1.0)
pH: 6.5 (ref 5.0–8.0)

## 2015-01-14 LAB — LIPASE, BLOOD: Lipase: 16 U/L — ABNORMAL LOW (ref 22–51)

## 2015-01-14 LAB — COMPREHENSIVE METABOLIC PANEL
ALBUMIN: 3.2 g/dL — AB (ref 3.5–5.0)
ALT: 12 U/L — ABNORMAL LOW (ref 17–63)
ANION GAP: 16 — AB (ref 5–15)
AST: 17 U/L (ref 15–41)
Alkaline Phosphatase: 67 U/L (ref 38–126)
BUN: 8 mg/dL (ref 6–20)
CO2: 23 mmol/L (ref 22–32)
CREATININE: 0.8 mg/dL (ref 0.61–1.24)
Calcium: 9.3 mg/dL (ref 8.9–10.3)
Chloride: 97 mmol/L — ABNORMAL LOW (ref 101–111)
GFR calc Af Amer: >60 mL/min (ref >60)
GFR calc non Af Amer: >60 mL/min (ref >60)
Glucose, Bld: 119 mg/dL — ABNORMAL HIGH (ref 65–99)
Potassium: 3.3 mmol/L — ABNORMAL LOW (ref 3.5–5.1)
Sodium: 136 mmol/L (ref 135–145)
Total Bilirubin: 0.9 mg/dL (ref 0.3–1.2)
Total Protein: 7.4 g/dL (ref 6.5–8.1)

## 2015-01-14 MED ORDER — SODIUM CHLORIDE 0.9 % IV BOLUS (SEPSIS)
1000.0000 mL | Freq: Once | INTRAVENOUS | Status: AC
  Administered 2015-01-14: 1000 mL via INTRAVENOUS

## 2015-01-14 MED ORDER — MORPHINE SULFATE 4 MG/ML IJ SOLN
4.0000 mg | Freq: Once | INTRAMUSCULAR | Status: AC
  Administered 2015-01-14: 4 mg via INTRAVENOUS
  Filled 2015-01-14: qty 1

## 2015-01-14 MED ORDER — ONDANSETRON HCL 4 MG/2ML IJ SOLN
4.0000 mg | Freq: Once | INTRAMUSCULAR | Status: AC
  Administered 2015-01-14: 4 mg via INTRAVENOUS
  Filled 2015-01-14: qty 2

## 2015-01-14 MED ORDER — POTASSIUM CHLORIDE CRYS ER 20 MEQ PO TBCR
20.0000 meq | EXTENDED_RELEASE_TABLET | Freq: Once | ORAL | Status: AC
  Administered 2015-01-14: 20 meq via ORAL
  Filled 2015-01-14: qty 1

## 2015-01-14 NOTE — ED Notes (Signed)
Pt. reports mid abdominal pain and constipation onset this week , denies nausea or vomitting , no fever or chills. Last BM 2 days ago .

## 2015-01-14 NOTE — ED Notes (Signed)
Pt verbalized understanding of d/c instructions and has no further questionjs.

## 2015-01-14 NOTE — Discharge Instructions (Signed)

## 2015-01-14 NOTE — ED Provider Notes (Signed)
CSN: [id number]     Arrival date & time 01/14/15  2002 History   First MD Initiated Contact with Patient 01/14/15 2023     Chief Complaint  Patient presents with  . Abdominal Pain  . Constipation     (Consider location/radiation/quality/duration/timing/severity/associated sxs/prior Treatment) HPI Brendan Hooper is a 69 year old male with a history of a headache, hypertension, brain cancer who presents for abdominal pain that began 3-4 days ago. He describes the pain as a pressure in the lower abdomen. He states he has not had a bowel movement in 2 days and he normally goes at 9 AM on a daily basis. He also has nausea but no vomiting. He denies any fever, chills, chest pain, shortness of breath, vomiting, diarrhea, dysuria, hematuria, urinary frequency. Past Medical History  Diagnosis Date  . GERD (gastroesophageal reflux disease)   . Headache(784.0)   . Sickle cell anemia   . Lung mass   . Hypertension   . 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 No past surgeries    . Portacath placement Left 09/01/2014    Procedure: INSERTION PORT-A-CATH;  Surgeon: Melrose Nakayama, MD;  Location: [facility] OR;  Service: Thoracic;  Laterality: Left;   Family History  Problem Relation Age of Onset  . Cancer Mother   . Stroke Father    History  Substance Use Topics  . Smoking status: Current Every Day Smoker -- 0.25 packs/day for 40 years    Types: Cigarettes  . Smokeless tobacco: Never Used  . Alcohol Use: 0.0 oz/week    0 Standard drinks or equivalent per week     Comment: social    Review of Systems  Gastrointestinal: Positive for abdominal pain and constipation. Negative for rectal pain.  Musculoskeletal: Negative for back pain.  Neurological: Negative for dizziness, weakness and light-headedness.  All other systems reviewed and are negative.     Allergies  Review of  patient's allergies indicates no known allergies.  Home Medications   Prior to Admission medications   Medication Sig Start Date End Date Taking? Authorizing Provider  dexamethasone (DECADRON) 4 MG tablet 4 mg by mouth twice a day the day before, day of and day after the chemotherapy every 3 weeks 12/14/14   Curt Bears, MD  fluconazole (DIFLUCAN) 100 MG tablet Take 1 tablet (100 mg total) by mouth daily. 01/09/15   Carlton Adam, PA-C  folic acid (FOLVITE) 1 MG tablet Take 1 tablet (1 mg total) by mouth daily. 08/11/14   Modena Jansky, MD  lidocaine-prilocaine (EMLA) cream Apply 1 application topically as needed. 09/02/14   Curt Bears, MD  Multiple Vitamin (MULTIVITAMIN WITH MINERALS) TABS tablet Take 1 tablet by mouth daily. 08/11/14   Modena Jansky, MD  nicotine (NICODERM CQ - DOSED IN MG/24 HOURS) 21 mg/24hr patch Place 1 patch (21 mg total) onto the skin daily. 12/15/14   Curt Bears, MD  oxyCODONE (OXY IR/ROXICODONE) 5 MG immediate release tablet Take 1-2 tablets (5-10 mg total) by mouth every 4 (four) hours as needed for moderate pain. 01/09/15   Carlton Adam, PA-C  potassium chloride SA (K-DUR,KLOR-CON) 20 MEQ tablet Take 1 tablet (20 mEq total) by mouth 2 (two) times daily. 01/02/15   Curt Bears, MD  prochlorperazine (COMPAZINE) 10 MG tablet Take 1 tablet (10 mg total) by mouth every 6 (six)  hours as needed for nausea or vomiting. 12/15/14   Curt Bears, MD  thiamine 100 MG tablet Take 1 tablet (100 mg total) by mouth daily. 08/11/14   Modena Jansky, MD   BP 113/67 mmHg  Pulse 88  Temp(Src) 98 F (36.7 C) (Oral)  Resp 16  Ht '6\' 6"'$  (1.981 m)  Wt 150 lb (68.04 kg)  BMI 17.34 kg/m2  SpO2 98% Physical Exam  Constitutional: He is oriented to person, place, and time. He appears well-developed and well-nourished.  HENT:  Head: Normocephalic and atraumatic.  Eyes: Conjunctivae are normal.  Neck: Normal range of motion. Neck supple.  Cardiovascular:  Normal rate, regular rhythm and normal heart sounds.   Pulmonary/Chest: Effort normal and breath sounds normal.  Abdominal: Soft. Normal appearance. He exhibits no distension and no mass. There is tenderness in the suprapubic area. There is no rebound and no guarding.    Genitourinary:  Rectal Exam: Chaperone present. Dark brown stool without blood. No impaction.  Musculoskeletal: Normal range of motion.  Neurological: He is alert and oriented to person, place, and time.  Skin: Skin is warm and dry.  Nursing note and vitals reviewed.   ED Course  Procedures (including critical care time) Labs Review Labs Reviewed  CBC WITH DIFFERENTIAL/PLATELET - Abnormal; Notable for the following:    WBC 3.5 (*)    Hemoglobin 12.5 (*)    HCT 36.8 (*)    RDW 16.5 (*)    All other components within normal limits  COMPREHENSIVE METABOLIC PANEL - Abnormal; Notable for the following:    Potassium 3.3 (*)    Chloride 97 (*)    Glucose, Bld 119 (*)    Albumin 3.2 (*)    ALT 12 (*)    Anion gap 16 (*)    All other components within normal limits  LIPASE, BLOOD - Abnormal; Notable for the following:    Lipase 16 (*)    All other components within normal limits  URINALYSIS, ROUTINE W REFLEX MICROSCOPIC (NOT AT [facility]) - Abnormal; Notable for the following:    APPearance CLOUDY (*)    Glucose, UA 100 (*)    Bilirubin Urine MODERATE (*)    Ketones, ur 40 (*)    Protein, ur 100 (*)    Leukocytes, UA TRACE (*)    All other components within normal limits  URINE MICROSCOPIC-ADD ON - Abnormal; Notable for the following:    Bacteria, UA FEW (*)    Casts HYALINE CASTS (*)    All other components within normal limits  URINE CULTURE    Imaging Review Dg Abd 1 View  01/14/2015   CLINICAL DATA:  Acute onset of mid abdominal pain and nausea. Initial encounter.  EXAM: ABDOMEN - 1 VIEW  COMPARISON:  CT of the chest, abdomen and pelvis from 12/01/2014  FINDINGS: The visualized bowel gas pattern is  unremarkable. Scattered air and stool filled loops of colon are seen; no abnormal dilatation of small bowel loops is seen to suggest small bowel obstruction. No free intra-abdominal air is identified, though evaluation for free air is limited on a single supine view.  The visualized osseous structures are within normal limits; the sacroiliac joints are unremarkable in appearance.  IMPRESSION: Unremarkable bowel gas pattern; no free intra-abdominal air seen. Small amount of stool noted in the colon.   Electronically Signed   By: Garald Balding M.D.   On: 01/14/2015 21:05     EKG Interpretation None      MDM  Final diagnoses:  Abdominal pain, suprapubic, unspecified laterality  Patient presents for abdominal pain for the past 2 days. He states he has not had a bowel movement for the last 2 days also. On exam he has mild suprapubic tenderness to palpation. No fecal impaction. He is hypokalemic and treated with potassium in the ED. He has proteinuria but no UTI. His abdominal x-ray is negative for free air and he has a small amount of stool in the colon. After fluids, Zofran, and pain medication he is feeling much better. His vitals are stable. I have given him return precautions. I discussed this patient with Dr. Zenia Resides who agrees with the plan to have the patient follow up with his PCP.     Brendan Glazier, PA-C 01/14/15 2230

## 2015-01-14 NOTE — ED Provider Notes (Signed)
Medical screening examination/treatment/procedure(s) were conducted as a shared visit with non-physician practitioner(s) and myself.  I personally evaluated the patient during the encounter.   EKG Interpretation None     Patient here with crampy abdominal pain and feeling constipated. No vomiting, fever, nausea. Workup here shows mild ketosis in his urine. Abdominal exam is normal. Given fluids and meds feels better. Stable for discharge  Lacretia Leigh, MD 01/14/15 2210

## 2015-01-16 LAB — URINE CULTURE
Colony Count: NO GROWTH
Culture: NO GROWTH

## 2015-01-20 ENCOUNTER — Other Ambulatory Visit: Payer: Self-pay | Admitting: Medical Oncology

## 2015-01-20 ENCOUNTER — Other Ambulatory Visit: Payer: Self-pay | Admitting: *Deleted

## 2015-01-20 ENCOUNTER — Other Ambulatory Visit: Payer: Self-pay | Admitting: Internal Medicine

## 2015-01-20 DIAGNOSIS — C7931 Secondary malignant neoplasm of brain: Secondary | ICD-10-CM

## 2015-01-20 DIAGNOSIS — C3491 Malignant neoplasm of unspecified part of right bronchus or lung: Secondary | ICD-10-CM

## 2015-01-20 DIAGNOSIS — F17200 Nicotine dependence, unspecified, uncomplicated: Secondary | ICD-10-CM

## 2015-01-20 DIAGNOSIS — R918 Other nonspecific abnormal finding of lung field: Secondary | ICD-10-CM

## 2015-01-20 MED ORDER — PROCHLORPERAZINE MALEATE 10 MG PO TABS: 10.0000 mg | ORAL_TABLET | Freq: Four times a day (QID) | ORAL | Status: DC | PRN

## 2015-01-27 ENCOUNTER — Ambulatory Visit (HOSPITAL_COMMUNITY)
Admission: RE | Admit: 2015-01-27 | Discharge: 2015-01-27 | Disposition: A | Discharge status: Home / Self Care | Payer: Medicare Other | Source: Ambulatory Visit | Attending: Physician Assistant | Admitting: Physician Assistant

## 2015-01-27 ENCOUNTER — Encounter (HOSPITAL_COMMUNITY): Payer: Self-pay

## 2015-01-27 DIAGNOSIS — R599 Enlarged lymph nodes, unspecified: Secondary | ICD-10-CM | POA: Diagnosis not present

## 2015-01-27 DIAGNOSIS — C7931 Secondary malignant neoplasm of brain: Secondary | ICD-10-CM | POA: Diagnosis not present

## 2015-01-27 DIAGNOSIS — Z79899 Other long term (current) drug therapy: Secondary | ICD-10-CM | POA: Insufficient documentation

## 2015-01-27 DIAGNOSIS — C787 Secondary malignant neoplasm of liver and intrahepatic bile duct: Secondary | ICD-10-CM | POA: Insufficient documentation

## 2015-01-27 DIAGNOSIS — C3491 Malignant neoplasm of unspecified part of right bronchus or lung: Secondary | ICD-10-CM | POA: Diagnosis not present

## 2015-01-27 DIAGNOSIS — Z923 Personal history of irradiation: Secondary | ICD-10-CM | POA: Diagnosis not present

## 2015-01-27 DIAGNOSIS — R634 Abnormal weight loss: Secondary | ICD-10-CM | POA: Diagnosis not present

## 2015-01-27 DIAGNOSIS — C7970 Secondary malignant neoplasm of unspecified adrenal gland: Secondary | ICD-10-CM | POA: Insufficient documentation

## 2015-01-27 DIAGNOSIS — R42 Dizziness and giddiness: Secondary | ICD-10-CM | POA: Diagnosis not present

## 2015-01-27 DIAGNOSIS — Z08 Encounter for follow-up examination after completed treatment for malignant neoplasm: Secondary | ICD-10-CM | POA: Diagnosis not present

## 2015-01-27 DIAGNOSIS — K5900 Constipation, unspecified: Secondary | ICD-10-CM | POA: Diagnosis not present

## 2015-01-27 DIAGNOSIS — R1033 Periumbilical pain: Secondary | ICD-10-CM | POA: Diagnosis not present

## 2015-01-27 MED ORDER — IOHEXOL 300 MG/ML  SOLN
100.0000 mL | Freq: Once | INTRAMUSCULAR | Status: AC | PRN
  Administered 2015-01-27: 100 mL via INTRAVENOUS

## 2015-01-30 ENCOUNTER — Ambulatory Visit: Payer: Medicare Other

## 2015-01-30 ENCOUNTER — Ambulatory Visit (HOSPITAL_BASED_OUTPATIENT_CLINIC_OR_DEPARTMENT_OTHER): Payer: Medicare Other | Admitting: Physician Assistant

## 2015-01-30 ENCOUNTER — Encounter: Payer: Self-pay | Admitting: Physician Assistant

## 2015-01-30 ENCOUNTER — Other Ambulatory Visit (HOSPITAL_BASED_OUTPATIENT_CLINIC_OR_DEPARTMENT_OTHER): Payer: Medicare Other

## 2015-01-30 ENCOUNTER — Ambulatory Visit (HOSPITAL_BASED_OUTPATIENT_CLINIC_OR_DEPARTMENT_OTHER): Payer: Medicare Other

## 2015-01-30 VITALS — BP 103/67 | HR 101 | Resp 16

## 2015-01-30 VITALS — BP 77/57 | HR 124 | Temp 97.9°F | Resp 18 | Ht 78.0 in | Wt 127.6 lb

## 2015-01-30 DIAGNOSIS — R634 Abnormal weight loss: Secondary | ICD-10-CM | POA: Diagnosis not present

## 2015-01-30 DIAGNOSIS — C3491 Malignant neoplasm of unspecified part of right bronchus or lung: Secondary | ICD-10-CM

## 2015-01-30 DIAGNOSIS — R630 Anorexia: Secondary | ICD-10-CM

## 2015-01-30 DIAGNOSIS — B370 Candidal stomatitis: Secondary | ICD-10-CM | POA: Diagnosis not present

## 2015-01-30 DIAGNOSIS — C3411 Malignant neoplasm of upper lobe, right bronchus or lung: Secondary | ICD-10-CM

## 2015-01-30 DIAGNOSIS — C7951 Secondary malignant neoplasm of bone: Secondary | ICD-10-CM | POA: Diagnosis not present

## 2015-01-30 DIAGNOSIS — C7931 Secondary malignant neoplasm of brain: Secondary | ICD-10-CM

## 2015-01-30 DIAGNOSIS — E860 Dehydration: Secondary | ICD-10-CM

## 2015-01-30 LAB — CBC WITH DIFFERENTIAL/PLATELET
BASO%: 0.6 % (ref 0.0–2.0)
Basophils Absolute: 0 10*3/uL (ref 0.0–0.1)
EOS ABS: 0 10*3/uL (ref 0.0–0.5)
EOS%: 1.4 % (ref 0.0–7.0)
HEMATOCRIT: 38.2 % — AB (ref 38.4–49.9)
HEMOGLOBIN: 12.9 g/dL — AB (ref 13.0–17.1)
LYMPH%: 23.9 % (ref 14.0–49.0)
MCH: 28.3 pg (ref 27.2–33.4)
MCHC: 33.9 g/dL (ref 32.0–36.0)
MCV: 83.6 fL (ref 79.3–98.0)
MONO#: 0.8 10*3/uL (ref 0.1–0.9)
MONO%: 24.4 % — AB (ref 0.0–14.0)
NEUT#: 1.6 10*3/uL (ref 1.5–6.5)
NEUT%: 49.7 % (ref 39.0–75.0)
Platelets: 406 10*3/uL — ABNORMAL HIGH (ref 140–400)
RBC: 4.57 10*6/uL (ref 4.20–5.82)
RDW: 17.9 % — AB (ref 11.0–14.6)
WBC: 3.3 10*3/uL — AB (ref 4.0–10.3)
lymph#: 0.8 10*3/uL — ABNORMAL LOW (ref 0.9–3.3)

## 2015-01-30 LAB — COMPREHENSIVE METABOLIC PANEL (CC13)
ALBUMIN: 3.2 g/dL — AB (ref 3.5–5.0)
ALT: 9 U/L (ref 0–55)
ANION GAP: 15 meq/L — AB (ref 3–11)
AST: 22 U/L (ref 5–34)
Alkaline Phosphatase: 81 U/L (ref 40–150)
BUN: 5.5 mg/dL — ABNORMAL LOW (ref 7.0–26.0)
CALCIUM: 10.5 mg/dL — AB (ref 8.4–10.4)
CHLORIDE: 99 meq/L (ref 98–109)
CO2: 29 meq/L (ref 22–29)
Creatinine: 0.8 mg/dL (ref 0.7–1.3)
EGFR: >90 mL/min/{1.73_m2} (ref >90)
GLUCOSE: 125 mg/dL (ref 70–140)
POTASSIUM: 3.3 meq/L — AB (ref 3.5–5.1)
Sodium: 143 mEq/L (ref 136–145)
Total Bilirubin: 0.41 mg/dL (ref 0.20–1.20)
Total Protein: 7.2 g/dL (ref 6.4–8.3)

## 2015-01-30 MED ORDER — SODIUM CHLORIDE 0.9 % IJ SOLN
10.0000 mL | INTRAMUSCULAR | Status: DC | PRN
  Administered 2015-01-30: 10 mL via INTRAVENOUS
  Filled 2015-01-30: qty 10

## 2015-01-30 MED ORDER — HEPARIN SOD (PORK) LOCK FLUSH 100 UNIT/ML IV SOLN
500.0000 [IU] | Freq: Once | INTRAVENOUS | Status: AC
  Administered 2015-01-30: 500 [IU] via INTRAVENOUS
  Filled 2015-01-30: qty 5

## 2015-01-30 MED ORDER — SODIUM CHLORIDE 0.9 % IV SOLN
Freq: Once | INTRAVENOUS | Status: AC
  Administered 2015-01-30: 12:00:00 via INTRAVENOUS

## 2015-01-30 MED ORDER — SODIUM CHLORIDE 0.9 % IV SOLN: 1000.0000 mL | INTRAVENOUS | Status: AC

## 2015-01-30 NOTE — Progress Notes (Addendum)
[place] [phone]   [phone]  OFFICE PROGRESS NOTE  [name], MD [place] [address]  DIAGNOSIS: stage IV (T3, N2, M1 B) non-small cell lung cancer, adenocarcinoma presented with large right lung mass in addition to mediastinal lymphadenopathy and metastatic lesion in the right thigh diagnosed in December 2015.  PRIOR THERAPY: None.  CURRENT THERAPY:Systemic chemotherapy with carboplatin AUC of 5 and Alimta 500 mg/m started on 09/05/2014. Status post 6 cycles.  INTERVAL HISTORY: Brendan Hooper 69 y.o. male returns to the clinic today for follow-up visit accompanied by a family member. The patient tolerated his current systemic chemotherapy with carboplatin and Alimta fairly well with no significant adverse effects. He denied having any significant nausea or vomiting, no fever or chills. He does report decreased appetite and decreased by mouth intake, with more recent weight loss. He is drinking 1 Ensure daily. He also reports some constipation. He has no chest pain but continues to have shortness of breath with exertion with no cough or hemoptysis. The patient denied having any night sweats. He presents today to discuss the results of his recent restaging CT scan of the chest, abdomen and pelvis to re-evaluate his disease.  MEDICAL HISTORY: Past Medical History  Diagnosis Date  . GERD (gastroesophageal reflux disease)   . Headache(784.0)   . Sickle cell anemia   . Lung mass   . Hypertension   . 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  MEDICATIONS:  Current Outpatient Prescriptions  Medication Sig Dispense Refill  . dexamethasone (DECADRON) 4 MG tablet 4 mg by mouth twice a day the day before, day of and day after the chemotherapy every 3 weeks 40 tablet 1  . fluconazole (DIFLUCAN) 100 MG tablet Take 1  tablet (100 mg total) by mouth daily. 14 tablet 0  . folic acid (FOLVITE) 1 MG tablet Take 1 tablet (1 mg total) by mouth daily. 30 tablet 0  . lidocaine-prilocaine (EMLA) cream Apply 1 application topically as needed. 30 g 1  . Multiple Vitamin (MULTIVITAMIN WITH MINERALS) TABS tablet Take 1 tablet by mouth daily. 30 tablet 0  . nicotine (NICODERM CQ - DOSED IN MG/24 HOURS) 21 mg/24hr patch Place 1 patch (21 mg total) onto the skin daily. 28 patch 0  . oxyCODONE (OXY IR/ROXICODONE) 5 MG immediate release tablet Take 1-2 tablets (5-10 mg total) by mouth every 4 (four) hours as needed for moderate pain. 45 tablet 0  . potassium chloride SA (K-DUR,KLOR-CON) 20 MEQ tablet Take 1 tablet (20 mEq total) by mouth 2 (two) times daily. 14 tablet 0  . prochlorperazine (COMPAZINE) 10 MG tablet Take 1 tablet (10 mg total) by mouth every 6 (six) hours as needed for nausea or vomiting. 60 tablet 1  . thiamine 100 MG tablet Take 1 tablet (100 mg total) by mouth daily. 30 tablet 0   No current facility-administered medications for this visit.    SURGICAL HISTORY:  Past Surgical History  Procedure Laterality Date  . No past surgeries    . Portacath placement Left 09/01/2014    Procedure: INSERTION PORT-A-CATH;  Surgeon: Melrose Nakayama, MD;  Location: [facility] OR;  Service: Thoracic;  Laterality: Left;    REVIEW OF SYSTEMS:  Constitutional: positive for anorexia and weight loss Eyes: negative Ears, nose, mouth, throat, and  face: negative Respiratory: positive for dyspnea on exertion Cardiovascular: negative Gastrointestinal: negative Genitourinary:negative Integument/breast: negative Hematologic/lymphatic: negative Musculoskeletal:negative Neurological: negative Behavioral/Psych: negative Endocrine: negative Allergic/Immunologic: negative   PHYSICAL EXAMINATION: General appearance: alert, cooperative and no distress Head: Normocephalic, without obvious abnormality, atraumatic Neck: no adenopathy,  no JVD, supple, symmetrical, trachea midline and thyroid not enlarged, symmetric, no tenderness/mass/nodules Lymph nodes: Cervical, supraclavicular, and axillary nodes normal. Resp: clear to auscultation bilaterally Back: symmetric, no curvature. ROM normal. No CVA tenderness. Cardio: regular rate and rhythm, S1, S2 normal, no murmur, click, rub or gallop GI: soft, non-tender; bowel sounds normal; no masses,  no organomegaly Extremities: extremities normal, atraumatic, no cyanosis or edema Neurologic: Alert and oriented X 3, normal strength and tone. Normal symmetric reflexes. Normal coordination and gait Mouth: positive for significant thrush   ECOG PERFORMANCE STATUS: 1 - Symptomatic but completely ambulatory  Blood pressure 77/57, pulse 124, temperature 97.9 F (36.6 C), temperature source Oral, resp. rate 18, height '6\' 6"'$  (1.981 m), weight 127 lb 9.6 oz (57.879 kg), SpO2 100 %.  LABORATORY DATA: Lab Results  Component Value Date   WBC 3.3* 01/30/2015   HGB 12.9* 01/30/2015   HCT 38.2* 01/30/2015   MCV 83.6 01/30/2015   PLT 406* 01/30/2015      Chemistry      Component Value Date/Time   NA 143 01/30/2015 0958   NA 136 01/14/2015 2017   K 3.3* 01/30/2015 0958   K 3.3* 01/14/2015 2017   CL 97* 01/14/2015 2017   CO2 29 01/30/2015 0958   CO2 23 01/14/2015 2017   BUN 5.5* 01/30/2015 0958   BUN 8 01/14/2015 2017   CREATININE 0.8 01/30/2015 0958   CREATININE 0.80 01/14/2015 2017      Component Value Date/Time   CALCIUM 10.5* 01/30/2015 0958   CALCIUM 9.3 01/14/2015 2017   ALKPHOS 81 01/30/2015 0958   ALKPHOS 67 01/14/2015 2017   AST 22 01/30/2015 0958   AST 17 01/14/2015 2017   ALT 9 01/30/2015 0958   ALT 12* 01/14/2015 2017   BILITOT 0.41 01/30/2015 0958   BILITOT 0.9 01/14/2015 2017       RADIOGRAPHIC STUDIES: Dg Abd 1 View  01/14/2015   CLINICAL DATA:  Acute onset of mid abdominal pain and nausea. Initial encounter.  EXAM: ABDOMEN - 1 VIEW  COMPARISON:  CT of  the chest, abdomen and pelvis from 12/01/2014  FINDINGS: The visualized bowel gas pattern is unremarkable. Scattered air and stool filled loops of colon are seen; no abnormal dilatation of small bowel loops is seen to suggest small bowel obstruction. No free intra-abdominal air is identified, though evaluation for free air is limited on a single supine view.  The visualized osseous structures are within normal limits; the sacroiliac joints are unremarkable in appearance.  IMPRESSION: Unremarkable bowel gas pattern; no free intra-abdominal air seen. Small amount of stool noted in the colon.   Electronically Signed   By: Garald Balding M.D.   On: 01/14/2015 21:05   Ct Chest W Contrast  01/27/2015   CLINICAL DATA:  69 year old male with history of right-sided lung cancer diagnosed in December 2015. Status post radiation therapy to the lung complete in February 2016. Chemotherapy in progress. Brain metastasis. 20-30 pound weight loss in the past 3-4 weeks. Decreased appetite. Upper abdominal and umbilical pain, nausea, constipation and dizziness.  EXAM: CT CHEST, ABDOMEN, AND PELVIS WITH CONTRAST  TECHNIQUE: Multidetector CT imaging of the chest, abdomen and pelvis was performed following the standard protocol during  bolus administration of intravenous contrast.  CONTRAST:  161m OMNIPAQUE IOHEXOL 300 MG/ML  SOLN  COMPARISON:  CT of the chest, abdomen and pelvis 12/01/2014.  FINDINGS: CT CHEST FINDINGS  Mediastinum/Lymph Nodes: Heart size is normal. There is no significant pericardial fluid, thickening or pericardial calcification. There is atherosclerosis of the thoracic aorta, the great vessels of the mediastinum and the coronary arteries, including calcified atherosclerotic plaque in the left anterior descending, left circumflex and right coronary arteries. Subcarinal lymphadenopathy has slightly improved compared to the prior study, currently measuring 18 mm in short axis (previously 24 mm in short axis). Right  hilar lymphadenopathy appears very similar to the prior study, with the largest right infrahilar node measuring 13 mm in short axis. Esophagus is unremarkable in appearance. Left-sided subclavian single-lumen porta cath with tip terminating in the distal superior vena cava. No axillary lymphadenopathy.  Lungs/Pleura: Slight decreased size of perihilar right middle lobe mass, which currently measures 7.2 x 5.7 cm (image 32 of series 2). There continues to be overlying pleural retraction anteriorly, and extension to the right hilum. The lesion appears to traverse the minor fissure superiorly, extending slightly into the inferior aspect of the right upper lobe, best appreciated on sagittal image 31 of series 603. In addition, in the periphery of the right upper lobe there is an enlarging pleural based mass-like area measuring up to 5.4 x 2.1 cm (image 29 of series 6). Mild diffuse bronchial wall thickening. Mild centrilobular and paraseptal emphysema. A few scattered tiny sub cm nodules are also noted, similar to the prior examination. No pleural effusions.  Musculoskeletal/Soft Tissues: New lytic lesion in the left side of the T1 vertebral body measuring 1.7 x 1.3 cm (image 5 of series 2).  CT ABDOMEN AND PELVIS FINDINGS  Hepatobiliary: Previously noted lesion in the right lobe of the liver is similar in size to the prior examination, measuring 3.6 x 3.0 cm on today's study. Other sub cm well-defined low-attenuation lesions are noted throughout the liver, which are unchanged in size, number and pattern of distribution, presumably tiny cysts. No intra or extrahepatic biliary ductal dilatation. Gallbladder is normal in appearance.  Pancreas: No pancreatic mass. No pancreatic ductal dilatation. No pancreatic or peripancreatic fluid or inflammatory changes.  Spleen: Unremarkable.  Adrenals/Urinary Tract: Marked enlargement of a right adrenal mass, which currently measures 3.9 x 2.7 x 7.5 cm, which demonstrates potential  direct invasion into the inferior vena cava, best appreciated on image 60 of series 2. Mild left adrenal nodularity is also slightly increased compared to the prior examination. Well-defined low-attenuation lesions in the kidneys bilaterally are stable compared to prior studies, compatible with simple cysts, the largest of which is exophytic extending off the upper pole of the right kidney measuring up to 4.1 cm in diameter. No hydroureteronephrosis. Urinary bladder is nearly completely decompressed, but otherwise unremarkable in appearance.  Stomach/Bowel: The appearance of the stomach is normal. No pathologic dilatation of small bowel or colon.  Vascular/Lymphatic: Atherosclerosis throughout the abdominal and pelvic vasculature, without evidence of aneurysm or dissection. Similar appearance of a centrally necrotic right inguinal lymph node, which currently measures 2.6 cm (previously 2.5 cm). No other lymphadenopathy noted in the abdomen or pelvis.  Reproductive: Prostate gland and seminal vesicles are unremarkable in appearance.  Other: No significant volume of ascites.  No pneumoperitoneum.  Musculoskeletal: Worsening osseous metastasis, most notable for an enlarging lesion in the left side of the L1 vertebral body, which currently measures 2.9 x 2.8 cm and has a  small amount of overlying soft tissue component laterally, and is associated with a pathologic fracture of the superior endplate. There is also an enlarging mixed lytic and sclerotic lesion in the inferior aspect of the L4 vertebral body, and an enlarging lesion in the right side of the L5 vertebral body which currently measures up to 2.2 cm in diameter.  IMPRESSION: 1. Although there has been slight regression of the large perihilar right middle lobe mass, similar to slightly improved mediastinal and right hilar lymphadenopathy, and the hepatic metastasis is essentially stable in size compared to the prior examination, there is otherwise general  progression of metastatic disease, with enlargement of a pleural based mass in the right upper lobe, enlargement of bilateral adrenal metastases (including a very large right adrenal mass which is showing early signs of invasion into the inferior vena cava), as well as progressive osseous metastasis, as detailed above. 2. Additional incidental findings, as above.   Electronically Signed   By: Vinnie Langton M.D.   On: 01/27/2015 11:24   Ct Abdomen Pelvis W Contrast  01/27/2015   CLINICAL DATA:  69 year old male with history of right-sided lung cancer diagnosed in December 2015. Status post radiation therapy to the lung complete in February 2016. Chemotherapy in progress. Brain metastasis. 20-30 pound weight loss in the past 3-4 weeks. Decreased appetite. Upper abdominal and umbilical pain, nausea, constipation and dizziness.  EXAM: CT CHEST, ABDOMEN, AND PELVIS WITH CONTRAST  TECHNIQUE: Multidetector CT imaging of the chest, abdomen and pelvis was performed following the standard protocol during bolus administration of intravenous contrast.  CONTRAST:  121m OMNIPAQUE IOHEXOL 300 MG/ML  SOLN  COMPARISON:  CT of the chest, abdomen and pelvis 12/01/2014.  FINDINGS: CT CHEST FINDINGS  Mediastinum/Lymph Nodes: Heart size is normal. There is no significant pericardial fluid, thickening or pericardial calcification. There is atherosclerosis of the thoracic aorta, the great vessels of the mediastinum and the coronary arteries, including calcified atherosclerotic plaque in the left anterior descending, left circumflex and right coronary arteries. Subcarinal lymphadenopathy has slightly improved compared to the prior study, currently measuring 18 mm in short axis (previously 24 mm in short axis). Right hilar lymphadenopathy appears very similar to the prior study, with the largest right infrahilar node measuring 13 mm in short axis. Esophagus is unremarkable in appearance. Left-sided subclavian single-lumen porta cath  with tip terminating in the distal superior vena cava. No axillary lymphadenopathy.  Lungs/Pleura: Slight decreased size of perihilar right middle lobe mass, which currently measures 7.2 x 5.7 cm (image 32 of series 2). There continues to be overlying pleural retraction anteriorly, and extension to the right hilum. The lesion appears to traverse the minor fissure superiorly, extending slightly into the inferior aspect of the right upper lobe, best appreciated on sagittal image 31 of series 603. In addition, in the periphery of the right upper lobe there is an enlarging pleural based mass-like area measuring up to 5.4 x 2.1 cm (image 29 of series 6). Mild diffuse bronchial wall thickening. Mild centrilobular and paraseptal emphysema. A few scattered tiny sub cm nodules are also noted, similar to the prior examination. No pleural effusions.  Musculoskeletal/Soft Tissues: New lytic lesion in the left side of the T1 vertebral body measuring 1.7 x 1.3 cm (image 5 of series 2).  CT ABDOMEN AND PELVIS FINDINGS  Hepatobiliary: Previously noted lesion in the right lobe of the liver is similar in size to the prior examination, measuring 3.6 x 3.0 cm on today's study. Other sub cm well-defined  low-attenuation lesions are noted throughout the liver, which are unchanged in size, number and pattern of distribution, presumably tiny cysts. No intra or extrahepatic biliary ductal dilatation. Gallbladder is normal in appearance.  Pancreas: No pancreatic mass. No pancreatic ductal dilatation. No pancreatic or peripancreatic fluid or inflammatory changes.  Spleen: Unremarkable.  Adrenals/Urinary Tract: Marked enlargement of a right adrenal mass, which currently measures 3.9 x 2.7 x 7.5 cm, which demonstrates potential direct invasion into the inferior vena cava, best appreciated on image 60 of series 2. Mild left adrenal nodularity is also slightly increased compared to the prior examination. Well-defined low-attenuation lesions in the  kidneys bilaterally are stable compared to prior studies, compatible with simple cysts, the largest of which is exophytic extending off the upper pole of the right kidney measuring up to 4.1 cm in diameter. No hydroureteronephrosis. Urinary bladder is nearly completely decompressed, but otherwise unremarkable in appearance.  Stomach/Bowel: The appearance of the stomach is normal. No pathologic dilatation of small bowel or colon.  Vascular/Lymphatic: Atherosclerosis throughout the abdominal and pelvic vasculature, without evidence of aneurysm or dissection. Similar appearance of a centrally necrotic right inguinal lymph node, which currently measures 2.6 cm (previously 2.5 cm). No other lymphadenopathy noted in the abdomen or pelvis.  Reproductive: Prostate gland and seminal vesicles are unremarkable in appearance.  Other: No significant volume of ascites.  No pneumoperitoneum.  Musculoskeletal: Worsening osseous metastasis, most notable for an enlarging lesion in the left side of the L1 vertebral body, which currently measures 2.9 x 2.8 cm and has a small amount of overlying soft tissue component laterally, and is associated with a pathologic fracture of the superior endplate. There is also an enlarging mixed lytic and sclerotic lesion in the inferior aspect of the L4 vertebral body, and an enlarging lesion in the right side of the L5 vertebral body which currently measures up to 2.2 cm in diameter.  IMPRESSION: 1. Although there has been slight regression of the large perihilar right middle lobe mass, similar to slightly improved mediastinal and right hilar lymphadenopathy, and the hepatic metastasis is essentially stable in size compared to the prior examination, there is otherwise general progression of metastatic disease, with enlargement of a pleural based mass in the right upper lobe, enlargement of bilateral adrenal metastases (including a very large right adrenal mass which is showing early signs of invasion  into the inferior vena cava), as well as progressive osseous metastasis, as detailed above. 2. Additional incidental findings, as above.   Electronically Signed   By: Vinnie Langton M.D.   On: 01/27/2015 11:24    ASSESSMENT AND PLAN: This is a very pleasant 69 year old African American male with history of stage IV non-small cell lung cancer, adenocarcinoma and is status post 6 cycles of systemic chemotherapy was carboplatin and Alimta.    Patient was seen and examined with Dr. [name]. His recent restaging CT scan showed evidence for disease progression. Specifically with enlargement of the pleural based mass in the right upper lobe and enlargement of bilateral adrenal metastasis including a very large right adrenal mass which shows early signs of invasion into the inferior vena Was also progressive osseous metastasis. Dr. [name] discussed starting the patient on immunotherapy with nivolumab. Side effects of this treatment including but not limited to abnormalities in thyroid function, blood sugar, pneumonitis, hepatitis, colitis and dermatitis or discussed the patient. He wishes to proceed with this treatment. We will plan for him to receive his first treatment in 1 week, giving a  managed care time to obtain the necessary preauthorization's. For the oral candidiasis a prescription for Diflucan was sent to his pharmacy of record. Patient was advised to utilize Senokot S and MiraLax for the constipation.    He was advised to call immediately if he has any concerning symptoms in the interval.   The patient voices understanding of current disease status and treatment options and is in agreement with the current care plan.  All questions were answered. The patient knows to call the clinic with any problems, questions or concerns. We can certainly see the patient much sooner if necessary.  Carlton Adam, PA-C 01/30/2015   ADDENDUM: Hematology/Oncology Attending: I had a face to face encounter  with the patient. I recommended his care plan. This is a very pleasant 69 years old African-American male with stage IV non-small cell lung cancer, adenocarcinoma status post 6 cycles of induction chemotherapy with carboplatin and Alimta with initial response but unfortunately has evidence for disease progression after cycle #3 was enlargement of the bilateral adrenal metastasis. I discussed the scan results with the patient and his family members. I recommended for him to consider treatment with second line therapy in the form of immunotherapy with Nivolumab 3 MG/KG every 2 weeks. I discussed with the patient adverse effect of this treatment including immune mediated skin rash, diarrhea, liver, renal, thyroid or endocrine dysfunction. The patient would like to proceed with the treatment as planned and he is expected to start the first cycle of his treatment next week. For the oral thrush, we will start the patient on Diflucan. The patient would come back for follow-up visit in 3 weeks for reevaluation and management of any adverse effect of his treatment. He was advised to call immediately if he has any concerning symptoms in the interval.  Disclaimer: This note was dictated with voice recognition software. Similar sounding words can inadvertently be transcribed and may not be corrected upon review. Eilleen Kempf., MD 02/01/2015

## 2015-01-30 NOTE — Patient Instructions (Signed)

## 2015-01-31 NOTE — Patient Instructions (Signed)
Your restaging CT scan showed evidence for disease progression Return in one week to begin immunotherapy with nivolumab (Opdivo)

## 2015-02-03 ENCOUNTER — Encounter: Payer: Self-pay | Admitting: Internal Medicine

## 2015-02-07 ENCOUNTER — Inpatient Hospital Stay (HOSPITAL_COMMUNITY)
Admission: EM | Admit: 2015-02-07 | Discharge: 2015-02-12 | DRG: 640 | Disposition: A | Discharge status: Home Health | Payer: Medicare Other | Attending: Family Medicine | Admitting: Family Medicine

## 2015-02-07 ENCOUNTER — Emergency Department (HOSPITAL_COMMUNITY): Payer: Medicare Other

## 2015-02-07 ENCOUNTER — Encounter (HOSPITAL_COMMUNITY): Payer: Self-pay | Admitting: *Deleted

## 2015-02-07 ENCOUNTER — Inpatient Hospital Stay (HOSPITAL_COMMUNITY): Payer: Medicare Other

## 2015-02-07 DIAGNOSIS — I951 Orthostatic hypotension: Secondary | ICD-10-CM | POA: Diagnosis present

## 2015-02-07 DIAGNOSIS — I2510 Atherosclerotic heart disease of native coronary artery without angina pectoris: Secondary | ICD-10-CM | POA: Diagnosis present

## 2015-02-07 DIAGNOSIS — Z809 Family history of malignant neoplasm, unspecified: Secondary | ICD-10-CM

## 2015-02-07 DIAGNOSIS — Z79891 Long term (current) use of opiate analgesic: Secondary | ICD-10-CM | POA: Diagnosis not present

## 2015-02-07 DIAGNOSIS — F17210 Nicotine dependence, cigarettes, uncomplicated: Secondary | ICD-10-CM | POA: Diagnosis present

## 2015-02-07 DIAGNOSIS — C7971 Secondary malignant neoplasm of right adrenal gland: Secondary | ICD-10-CM | POA: Diagnosis present

## 2015-02-07 DIAGNOSIS — R55 Syncope and collapse: Secondary | ICD-10-CM | POA: Diagnosis not present

## 2015-02-07 DIAGNOSIS — C7951 Secondary malignant neoplasm of bone: Secondary | ICD-10-CM | POA: Diagnosis present

## 2015-02-07 DIAGNOSIS — I739 Peripheral vascular disease, unspecified: Secondary | ICD-10-CM | POA: Diagnosis present

## 2015-02-07 DIAGNOSIS — E860 Dehydration: Secondary | ICD-10-CM

## 2015-02-07 DIAGNOSIS — C3491 Malignant neoplasm of unspecified part of right bronchus or lung: Secondary | ICD-10-CM | POA: Diagnosis present

## 2015-02-07 DIAGNOSIS — F1020 Alcohol dependence, uncomplicated: Secondary | ICD-10-CM | POA: Diagnosis present

## 2015-02-07 DIAGNOSIS — E876 Hypokalemia: Secondary | ICD-10-CM

## 2015-02-07 DIAGNOSIS — Z823 Family history of stroke: Secondary | ICD-10-CM | POA: Diagnosis not present

## 2015-02-07 DIAGNOSIS — M542 Cervicalgia: Secondary | ICD-10-CM | POA: Diagnosis not present

## 2015-02-07 DIAGNOSIS — K219 Gastro-esophageal reflux disease without esophagitis: Secondary | ICD-10-CM | POA: Diagnosis present

## 2015-02-07 DIAGNOSIS — C7931 Secondary malignant neoplasm of brain: Secondary | ICD-10-CM | POA: Diagnosis present

## 2015-02-07 DIAGNOSIS — Z79899 Other long term (current) drug therapy: Secondary | ICD-10-CM

## 2015-02-07 DIAGNOSIS — D571 Sickle-cell disease without crisis: Secondary | ICD-10-CM | POA: Diagnosis present

## 2015-02-07 DIAGNOSIS — E43 Unspecified severe protein-calorie malnutrition: Secondary | ICD-10-CM | POA: Diagnosis present

## 2015-02-07 DIAGNOSIS — Z681 Body mass index (BMI) 19 or less, adult: Secondary | ICD-10-CM

## 2015-02-07 DIAGNOSIS — I10 Essential (primary) hypertension: Secondary | ICD-10-CM | POA: Diagnosis present

## 2015-02-07 LAB — I-STAT TROPONIN, ED: Troponin i, poc: 0.04 ng/mL (ref 0.00–0.08)

## 2015-02-07 LAB — CBC WITH DIFFERENTIAL/PLATELET
BASOS PCT: 0 % (ref 0–1)
Basophils Absolute: 0 10*3/uL (ref 0.0–0.1)
Eosinophils Absolute: 0.1 10*3/uL (ref 0.0–0.7)
Eosinophils Relative: 1 % (ref 0–5)
HEMATOCRIT: 32.6 % — AB (ref 39.0–52.0)
Hemoglobin: 11.1 g/dL — ABNORMAL LOW (ref 13.0–17.0)
LYMPHS ABS: 0.7 10*3/uL (ref 0.7–4.0)
LYMPHS PCT: 10 % — AB (ref 12–46)
MCH: 27.8 pg (ref 26.0–34.0)
MCHC: 34 g/dL (ref 30.0–36.0)
MCV: 81.5 fL (ref 78.0–100.0)
MONO ABS: 1.2 10*3/uL — AB (ref 0.1–1.0)
MONOS PCT: 17 % — AB (ref 3–12)
NEUTROS ABS: 4.9 10*3/uL (ref 1.7–7.7)
Neutrophils Relative %: 72 % (ref 43–77)
Platelets: 268 10*3/uL (ref 150–400)
RBC: 4 MIL/uL — AB (ref 4.22–5.81)
RDW: 16.4 % — ABNORMAL HIGH (ref 11.5–15.5)
WBC: 7 10*3/uL (ref 4.0–10.5)

## 2015-02-07 LAB — COMPREHENSIVE METABOLIC PANEL
ALK PHOS: 61 U/L (ref 38–126)
ALT: 8 U/L — AB (ref 17–63)
ANION GAP: 12 (ref 5–15)
AST: 17 U/L (ref 15–41)
Albumin: 3 g/dL — ABNORMAL LOW (ref 3.5–5.0)
BILIRUBIN TOTAL: 0.4 mg/dL (ref 0.3–1.2)
BUN: 7 mg/dL (ref 6–20)
CO2: 29 mmol/L (ref 22–32)
Calcium: 9.4 mg/dL (ref 8.9–10.3)
Chloride: 101 mmol/L (ref 101–111)
Creatinine, Ser: 0.57 mg/dL — ABNORMAL LOW (ref 0.61–1.24)
GFR calc Af Amer: >60 mL/min (ref >60)
GFR calc non Af Amer: >60 mL/min (ref >60)
Glucose, Bld: 126 mg/dL — ABNORMAL HIGH (ref 65–99)
POTASSIUM: 2.5 mmol/L — AB (ref 3.5–5.1)
SODIUM: 142 mmol/L (ref 135–145)
Total Protein: 6.5 g/dL (ref 6.5–8.1)

## 2015-02-07 LAB — I-STAT CG4 LACTIC ACID, ED: LACTIC ACID, VENOUS: 1.05 mmol/L (ref 0.5–2.0)

## 2015-02-07 MED ORDER — POTASSIUM CHLORIDE 10 MEQ/100ML IV SOLN
10.0000 meq | INTRAVENOUS | Status: DC
Start: 2015-02-07 — End: 2015-02-08
  Administered 2015-02-07 – 2015-02-08 (×3): 10 meq via INTRAVENOUS
  Filled 2015-02-07 (×3): qty 100

## 2015-02-07 MED ORDER — POTASSIUM CHLORIDE CRYS ER 20 MEQ PO TBCR
40.0000 meq | EXTENDED_RELEASE_TABLET | Freq: Once | ORAL | Status: AC
  Administered 2015-02-07: 40 meq via ORAL
  Filled 2015-02-07: qty 2

## 2015-02-07 MED ORDER — SODIUM CHLORIDE 0.9 % IV BOLUS (SEPSIS)
1000.0000 mL | Freq: Once | INTRAVENOUS | Status: AC
  Administered 2015-02-07: 1000 mL via INTRAVENOUS

## 2015-02-07 MED ORDER — SODIUM CHLORIDE 0.9 % IV SOLN
Freq: Once | INTRAVENOUS | Status: AC
  Administered 2015-02-07: 22:00:00 via INTRAVENOUS

## 2015-02-07 MED ORDER — SODIUM CHLORIDE 0.9 % IV SOLN: INTRAVENOUS | Status: DC

## 2015-02-07 NOTE — ED Notes (Signed)
2 unsuccessful attempts at blood draw by two people,  Dr Darl Householder is aware and states pt will receive fluid bolus first then attempt again, pt appears to be dehydrated

## 2015-02-07 NOTE — ED Notes (Signed)
Pt states he doesn't eat or drink much, very weak

## 2015-02-07 NOTE — ED Notes (Signed)
Pt arrived via EMS from home,  He had a syncopal episode earlier today, denies any injury,  Pt has been dizzy per ems he is hypotensive while laying down at 90/70,  Pt has history of fall

## 2015-02-07 NOTE — ED Notes (Signed)
Patient transported to CT 

## 2015-02-07 NOTE — ED Notes (Signed)
Unable to get labs at this time MD stated he will give him

## 2015-02-07 NOTE — ED Notes (Signed)
Unable  To collect labs at this time MD stated for patient to get fluids then come back and collect labs.

## 2015-02-07 NOTE — ED Provider Notes (Signed)
CSN: [id number]     Arrival date & time 02/07/15  2101 History   First MD Initiated Contact with Patient 02/07/15 2109     Chief Complaint  Patient presents with  . Dizziness  . Fall  . 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 Patient had a syncopal episode this evening. Patient states that he was sitting down and stood up to go outside when he became lightheaded with tunnel vision and passed out. Roommate assisted him and called EMS. Patient is currently back at his baseline. He denies chest pain, shortness of breath at any time. Patient states that he has not been eating well but is tracking water. He feels generally weak. No unilateral weakness, confusion, slurred speech or facial droop. Patient denies fever, URI symptoms. No abdominal pain, nausea, vomiting, or diarrhea. No urinary symptoms. No skin rashes. Patient is set to resume chemotherapy in early July 2016. Onset of symptoms acute. Course is improving. Nothing makes symptoms better or worse.  Patient is a 69 y.o. male presenting with dizziness, fall, and syncope. The history is provided by the patient and medical records.  Dizziness Associated symptoms: syncope   Associated symptoms: no chest pain, no diarrhea, no headaches, no nausea and no vomiting   Fall Pertinent negatives include no abdominal pain, chest pain, coughing, fever, headaches, myalgias, nausea, rash, sore throat or vomiting.  Loss of Consciousness Associated symptoms: dizziness   Associated symptoms: no chest pain, no fever, no headaches, no nausea and no vomiting     Past Medical History  Diagnosis Date  . GERD (gastroesophageal reflux disease)   . Headache(784.0)   . Sickle cell anemia   . Lung mass   . Hypertension   . Bronchogenic cancer of right lung 08/25/2014   . 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 No past surgeries    . Portacath placement Left 09/01/2014    Procedure: INSERTION PORT-A-CATH;  Surgeon: Melrose Nakayama, MD;  Location: [facility] OR;  Service: Thoracic;  Laterality: Left;   Family History  Problem Relation Age of Onset  . Cancer Mother   . Stroke Father    History  Substance Use Topics  . Smoking status: Current Every Day Smoker -- 0.25 packs/day for 40 years    Types: Cigarettes  . Smokeless tobacco: Never Used  . Alcohol Use: 0.0 oz/week    0 Standard drinks or equivalent per week     Comment: social    Review of Systems  Constitutional: Positive for appetite change. Negative for fever.  HENT: Negative for rhinorrhea and sore throat.   Eyes: Negative for redness.  Respiratory: Negative for cough.   Cardiovascular: Positive for syncope. Negative for chest pain.  Gastrointestinal: Negative for nausea, vomiting, abdominal pain and diarrhea.  Genitourinary: Negative for dysuria.  Musculoskeletal: Negative for myalgias.  Skin: Negative for rash.  Neurological: Positive for dizziness, syncope and light-headedness. Negative for headaches.      Allergies  Review of patient's allergies indicates no known allergies.  Home Medications   Prior to Admission medications   Medication Sig Start Date End Date Taking? Authorizing Provider  dexamethasone (DECADRON) 4 MG tablet 4 mg by mouth twice a day the day before, day of and day after the chemotherapy every 3 weeks  12/14/14   Curt Bears, MD  fluconazole (DIFLUCAN) 100 MG tablet Take 1 tablet (100 mg total) by mouth daily. 01/09/15   Carlton Adam, PA-C  folic acid (FOLVITE) 1 MG tablet Take 1 tablet (1 mg total) by mouth daily. 08/11/14   Modena Jansky, MD  lidocaine-prilocaine (EMLA) cream Apply 1 application topically as needed. 09/02/14   Curt Bears, MD  Multiple Vitamin  (MULTIVITAMIN WITH MINERALS) TABS tablet Take 1 tablet by mouth daily. 08/11/14   Modena Jansky, MD  nicotine (NICODERM CQ - DOSED IN MG/24 HOURS) 21 mg/24hr patch Place 1 patch (21 mg total) onto the skin daily. 12/15/14   Curt Bears, MD  oxyCODONE (OXY IR/ROXICODONE) 5 MG immediate release tablet Take 1-2 tablets (5-10 mg total) by mouth every 4 (four) hours as needed for moderate pain. 01/09/15   Carlton Adam, PA-C  potassium chloride SA (K-DUR,KLOR-CON) 20 MEQ tablet Take 1 tablet (20 mEq total) by mouth 2 (two) times daily. 01/02/15   Curt Bears, MD  prochlorperazine (COMPAZINE) 10 MG tablet Take 1 tablet (10 mg total) by mouth every 6 (six) hours as needed for nausea or vomiting. 01/20/15   Carlton Adam, PA-C  thiamine 100 MG tablet Take 1 tablet (100 mg total) by mouth daily. 08/11/14   Modena Jansky, MD   BP 91/57 mmHg  Pulse 114  Temp(Src) 98.4 F (36.9 C)  Resp 18  SpO2 97%   Physical Exam  Constitutional: He appears well-developed.  Thin  HENT:  Head: Normocephalic and atraumatic.  Mouth/Throat: Oropharynx is clear and moist.  Eyes: Conjunctivae are normal. Right eye exhibits no discharge. Left eye exhibits no discharge.  No significant pallor  Neck: Normal range of motion. Neck supple.  Cardiovascular: Regular rhythm and normal heart sounds.  Tachycardia present.   No murmur heard. Pulmonary/Chest: Effort normal and breath sounds normal. No respiratory distress. He has no wheezes. He has no rales.  Abdominal: Soft. There is no tenderness. There is no rebound and no guarding.  Musculoskeletal: He exhibits no edema or tenderness.  No LE swelling or tenderness. No clinical signs of DVT.   Neurological: He is alert.  Skin: Skin is warm and dry.  Psychiatric: He has a normal mood and affect.  Nursing note and vitals reviewed.   ED Course  Procedures (including critical care time) Labs Review Labs Reviewed  CBC WITH DIFFERENTIAL/PLATELET - Abnormal;  Notable for the following:    RBC 4.00 (*)    Hemoglobin 11.1 (*)    HCT 32.6 (*)    RDW 16.4 (*)    Lymphocytes Relative 10 (*)    Monocytes Relative 17 (*)    Monocytes Absolute 1.2 (*)    All other components within normal limits  COMPREHENSIVE METABOLIC PANEL - Abnormal; Notable for the following:    Potassium 2.5 (*)    Glucose, Bld 126 (*)    Creatinine, Ser 0.57 (*)    Albumin 3.0 (*)    ALT 8 (*)    All other components within normal limits  URINALYSIS, ROUTINE W REFLEX MICROSCOPIC (NOT AT [facility])  I-STAT CG4 LACTIC ACID, ED  Randolm Idol, ED    Imaging Review Dg Chest 2 View  02/07/2015   CLINICAL DATA:  Acute onset of dizziness and syncope. Status post fall. Initial encounter.  EXAM: CHEST  2 VIEW  COMPARISON:  CT of the chest performed 01/27/2015  FINDINGS: The patient's right middle lobe lung mass is again noted, with overlying  peripheral right-sided airspace opacification. No pleural effusion or pneumothorax identified.  The heart remains normal in size. A chest port is noted at the left chest wall, ending at the mid SVC. No acute osseous abnormalities are identified.  IMPRESSION: Right middle lobe lung mass again noted, with overlying right-sided airspace opacification. No displaced rib fracture seen.   Electronically Signed   By: Garald Balding M.D.   On: 02/07/2015 22:28     EKG Interpretation   Date/Time:  Tuesday February 07 2015 21:08:38 EDT Ventricular Rate:  109 PR Interval:  139 QRS Duration: 92 QT Interval:  344 QTC Calculation: 463 R Axis:   -53 Text Interpretation:  Sinus tachycardia Ventricular premature complex  Biatrial enlargement Left anterior fascicular block Abnormal R-wave  progression, early transition tachycardia new since previous Confirmed by  YAO  MD, DAVID ([id number]) on 02/07/2015 10:27:00 PM       9:36 PM Patient seen and examined. Discussed with Dr. Darl Householder who will see. Work-up initiated. Medications ordered.   Vital signs reviewed and are  as follows: BP 91/57 mmHg  Pulse 114  Temp(Src) 98.4 F (36.9 C)  Resp 18  SpO2 97%  10:09 PM Patient symptomatic with standing. Will likely need admission. Pending labs.   11:22 PM BP improved with fluids. HR=100. Potassium ordered for hypokalemia. Pt agrees to admission. Will admit.   11:28 PM Spoke with Dr. Hal Hope who will admit. Asked for CT head and additional fluids.   MDM   Final diagnoses:  Syncope, unspecified syncope type  Dehydration  Hypokalemia  Syncope   Admit.   Carlisle Cater, PA-C 02/07/15 2328  Wandra Arthurs, MD 02/08/15 [phone]

## 2015-02-07 NOTE — ED Notes (Signed)
Pt is unable to give a urine sample at this time.

## 2015-02-07 NOTE — ED Notes (Signed)
Bed: OL41 Expected date:  Expected time:  Means of arrival:  Comments: EMS per Karna Christmas

## 2015-02-08 ENCOUNTER — Encounter (HOSPITAL_COMMUNITY): Payer: Self-pay | Admitting: Internal Medicine

## 2015-02-08 ENCOUNTER — Observation Stay (HOSPITAL_COMMUNITY): Payer: Medicare Other

## 2015-02-08 ENCOUNTER — Inpatient Hospital Stay (HOSPITAL_COMMUNITY)
Admit: 2015-02-08 | Discharge: 2015-02-08 | Disposition: A | Discharge status: Home / Self Care | Payer: Medicare Other | Attending: Internal Medicine | Admitting: Internal Medicine

## 2015-02-08 DIAGNOSIS — M542 Cervicalgia: Secondary | ICD-10-CM | POA: Insufficient documentation

## 2015-02-08 LAB — BASIC METABOLIC PANEL
Anion gap: 7 (ref 5–15)
BUN: <5 mg/dL — ABNORMAL LOW (ref 6–20)
CHLORIDE: 105 mmol/L (ref 101–111)
CO2: 25 mmol/L (ref 22–32)
Calcium: 8.9 mg/dL (ref 8.9–10.3)
Creatinine, Ser: 0.53 mg/dL — ABNORMAL LOW (ref 0.61–1.24)
GFR calc Af Amer: >60 mL/min (ref >60)
GFR calc non Af Amer: >60 mL/min (ref >60)
GLUCOSE: 109 mg/dL — AB (ref 65–99)
Potassium: 3.3 mmol/L — ABNORMAL LOW (ref 3.5–5.1)
Sodium: 137 mmol/L (ref 135–145)

## 2015-02-08 LAB — CBC WITH DIFFERENTIAL/PLATELET
BASOS ABS: 0 10*3/uL (ref 0.0–0.1)
Basophils Relative: 0 % (ref 0–1)
EOS PCT: 1 % (ref 0–5)
Eosinophils Absolute: 0.1 10*3/uL (ref 0.0–0.7)
HEMATOCRIT: 27.1 % — AB (ref 39.0–52.0)
Hemoglobin: 9.5 g/dL — ABNORMAL LOW (ref 13.0–17.0)
LYMPHS ABS: 0.8 10*3/uL (ref 0.7–4.0)
LYMPHS PCT: 14 % (ref 12–46)
MCH: 28.5 pg (ref 26.0–34.0)
MCHC: 35.1 g/dL (ref 30.0–36.0)
MCV: 81.4 fL (ref 78.0–100.0)
MONO ABS: 1 10*3/uL (ref 0.1–1.0)
Monocytes Relative: 16 % — ABNORMAL HIGH (ref 3–12)
NEUTROS ABS: 4 10*3/uL (ref 1.7–7.7)
Neutrophils Relative %: 69 % (ref 43–77)
Platelets: 263 10*3/uL (ref 150–400)
RBC: 3.33 MIL/uL — ABNORMAL LOW (ref 4.22–5.81)
RDW: 16.5 % — AB (ref 11.5–15.5)
WBC: 5.9 10*3/uL (ref 4.0–10.5)

## 2015-02-08 LAB — URINALYSIS, ROUTINE W REFLEX MICROSCOPIC
Glucose, UA: NEGATIVE mg/dL
HGB URINE DIPSTICK: NEGATIVE
Ketones, ur: 40 mg/dL — AB
LEUKOCYTES UA: NEGATIVE
Nitrite: NEGATIVE
PH: 6 (ref 5.0–8.0)
Protein, ur: NEGATIVE mg/dL
SPECIFIC GRAVITY, URINE: 1.016 (ref 1.005–1.030)
UROBILINOGEN UA: 1 mg/dL (ref 0.0–1.0)

## 2015-02-08 LAB — MAGNESIUM: Magnesium: 1.3 mg/dL — ABNORMAL LOW (ref 1.7–2.4)

## 2015-02-08 LAB — PROCALCITONIN: Procalcitonin: 18.74 ng/mL

## 2015-02-08 MED ORDER — OXYCODONE HCL 5 MG PO TABS
5.0000 mg | ORAL_TABLET | ORAL | Status: DC | PRN
  Administered 2015-02-08 – 2015-02-12 (×13): 10 mg via ORAL
  Filled 2015-02-08 (×13): qty 2

## 2015-02-08 MED ORDER — POTASSIUM CHLORIDE IN NACL 20-0.9 MEQ/L-% IV SOLN
INTRAVENOUS | Status: AC
  Administered 2015-02-08: 100 mL/h via INTRAVENOUS
  Administered 2015-02-08: 10:00:00 via INTRAVENOUS
  Filled 2015-02-08 (×3): qty 1000

## 2015-02-08 MED ORDER — ADULT MULTIVITAMIN W/MINERALS CH
1.0000 | ORAL_TABLET | Freq: Every day | ORAL | Status: DC
  Administered 2015-02-08 – 2015-02-12 (×5): 1 via ORAL
  Filled 2015-02-08 (×5): qty 1

## 2015-02-08 MED ORDER — ENSURE ENLIVE PO LIQD
237.0000 mL | Freq: Two times a day (BID) | ORAL | Status: DC
  Administered 2015-02-08 – 2015-02-12 (×5): 237 mL via ORAL

## 2015-02-08 MED ORDER — FOLIC ACID 1 MG PO TABS
1.0000 mg | ORAL_TABLET | Freq: Every day | ORAL | Status: DC
  Filled 2015-02-08: qty 1

## 2015-02-08 MED ORDER — ENOXAPARIN SODIUM 40 MG/0.4ML ~~LOC~~ SOLN
40.0000 mg | SUBCUTANEOUS | Status: DC
Start: 2015-02-08 — End: 2015-02-12
  Administered 2015-02-08 – 2015-02-12 (×5): 40 mg via SUBCUTANEOUS
  Filled 2015-02-08 (×5): qty 0.4

## 2015-02-08 MED ORDER — BOOST / RESOURCE BREEZE PO LIQD
1.0000 | Freq: Two times a day (BID) | ORAL | Status: DC
  Administered 2015-02-08 – 2015-02-12 (×4): 1 via ORAL

## 2015-02-08 MED ORDER — LORAZEPAM 2 MG/ML IJ SOLN: 1.0000 mg | Freq: Four times a day (QID) | INTRAMUSCULAR | Status: AC | PRN

## 2015-02-08 MED ORDER — ACETAMINOPHEN 325 MG PO TABS
650.0000 mg | ORAL_TABLET | Freq: Four times a day (QID) | ORAL | Status: DC | PRN
  Administered 2015-02-11: 650 mg via ORAL
  Filled 2015-02-08: qty 2

## 2015-02-08 MED ORDER — SODIUM CHLORIDE 0.9 % IV SOLN: INTRAVENOUS | Status: DC

## 2015-02-08 MED ORDER — LORAZEPAM 1 MG PO TABS
1.0000 mg | ORAL_TABLET | Freq: Four times a day (QID) | ORAL | Status: AC | PRN
Start: 2015-02-08 — End: 2015-02-11

## 2015-02-08 MED ORDER — VITAMIN B-1 100 MG PO TABS
100.0000 mg | ORAL_TABLET | Freq: Every day | ORAL | Status: DC
  Administered 2015-02-08: 100 mg via ORAL
  Filled 2015-02-08 (×2): qty 1

## 2015-02-08 MED ORDER — PIPERACILLIN-TAZOBACTAM 3.375 G IVPB
3.3750 g | Freq: Three times a day (TID) | INTRAVENOUS | Status: DC
  Administered 2015-02-08 – 2015-02-09 (×4): 3.375 g via INTRAVENOUS
  Filled 2015-02-08 (×5): qty 50

## 2015-02-08 MED ORDER — ONDANSETRON HCL 4 MG/2ML IJ SOLN: 4.0000 mg | Freq: Four times a day (QID) | INTRAMUSCULAR | Status: DC | PRN

## 2015-02-08 MED ORDER — ADULT MULTIVITAMIN W/MINERALS CH
1.0000 | ORAL_TABLET | Freq: Every day | ORAL | Status: DC
  Filled 2015-02-08: qty 1

## 2015-02-08 MED ORDER — POTASSIUM CHLORIDE CRYS ER 20 MEQ PO TBCR
20.0000 meq | EXTENDED_RELEASE_TABLET | Freq: Two times a day (BID) | ORAL | Status: DC
  Administered 2015-02-08 – 2015-02-11 (×8): 20 meq via ORAL
  Filled 2015-02-08 (×8): qty 1

## 2015-02-08 MED ORDER — MAGNESIUM SULFATE 2 GM/50ML IV SOLN
2.0000 g | Freq: Once | INTRAVENOUS | Status: AC
  Administered 2015-02-08: 2 g via INTRAVENOUS
  Filled 2015-02-08 (×2): qty 50

## 2015-02-08 MED ORDER — POTASSIUM CHLORIDE 10 MEQ/100ML IV SOLN
10.0000 meq | INTRAVENOUS | Status: AC
  Administered 2015-02-08 (×2): 10 meq via INTRAVENOUS
  Filled 2015-02-08 (×2): qty 100

## 2015-02-08 MED ORDER — VITAMIN B-1 100 MG PO TABS
100.0000 mg | ORAL_TABLET | Freq: Every day | ORAL | Status: DC
Start: 2015-02-08 — End: 2015-02-12
  Administered 2015-02-09 – 2015-02-12 (×4): 100 mg via ORAL
  Filled 2015-02-08 (×5): qty 1

## 2015-02-08 MED ORDER — PROCHLORPERAZINE MALEATE 10 MG PO TABS: 10.0000 mg | ORAL_TABLET | Freq: Four times a day (QID) | ORAL | Status: DC | PRN

## 2015-02-08 MED ORDER — PIPERACILLIN-TAZOBACTAM 3.375 G IVPB 30 MIN
3.3750 g | Freq: Once | INTRAVENOUS | Status: AC
  Administered 2015-02-08: 3.375 g via INTRAVENOUS
  Filled 2015-02-08: qty 50

## 2015-02-08 MED ORDER — ONDANSETRON HCL 4 MG PO TABS: 4.0000 mg | ORAL_TABLET | Freq: Four times a day (QID) | ORAL | Status: DC | PRN

## 2015-02-08 MED ORDER — VANCOMYCIN HCL IN DEXTROSE 750-5 MG/150ML-% IV SOLN
750.0000 mg | Freq: Two times a day (BID) | INTRAVENOUS | Status: DC
  Administered 2015-02-08 – 2015-02-09 (×3): 750 mg via INTRAVENOUS
  Filled 2015-02-08 (×5): qty 150

## 2015-02-08 MED ORDER — POTASSIUM CHLORIDE 10 MEQ/100ML IV SOLN
10.0000 meq | Freq: Once | INTRAVENOUS | Status: AC
  Administered 2015-02-08: 10 meq via INTRAVENOUS

## 2015-02-08 MED ORDER — THIAMINE HCL 100 MG/ML IJ SOLN: 100.0000 mg | Freq: Every day | INTRAMUSCULAR | Status: DC

## 2015-02-08 MED ORDER — FOLIC ACID 1 MG PO TABS
1.0000 mg | ORAL_TABLET | Freq: Every day | ORAL | Status: DC
  Administered 2015-02-08 – 2015-02-12 (×5): 1 mg via ORAL
  Filled 2015-02-08 (×5): qty 1

## 2015-02-08 MED ORDER — ACETAMINOPHEN 650 MG RE SUPP: 650.0000 mg | Freq: Four times a day (QID) | RECTAL | Status: DC | PRN

## 2015-02-08 NOTE — Progress Notes (Signed)
Orthostatic VS = lying 106/65 & 103  Then sitting = 91/58  & 114  Then standing = 82/53  127.  Pt denies dizziness

## 2015-02-08 NOTE — Progress Notes (Signed)
Initial Nutrition Assessment  DOCUMENTATION CODES:  Severe malnutrition in context of chronic illness, Underweight  INTERVENTION: - Will order Resource Breeze po BID, each supplement provides 250 kcal and 9 grams of protein - Continue Ensure Enlive po BID, each supplement provides 350 kcal and 20 grams of protein - RD will continue to monitor for needs  NUTRITION DIAGNOSIS:  Increased nutrient needs related to chronic illness, cancer and cancer related treatments as evidenced by estimated needs.  GOAL:  Patient will meet greater than or equal to 90% of their needs  MONITOR:  PO intake, Supplement acceptance, Weight trends, Labs  REASON FOR ASSESSMENT:  Malnutrition Screening Tool  ASSESSMENT: 69 y.o. male with history of non-small cell lung cancer on chemotherapy, last chemotherapy last month presents to the ER after patient had brief episode of loss of consciousness. Patient states he was trying to walk towards the hall in his house when suddenly he lost consciousness and fell. Patient thinks he may have lost consciousness for less than a minute.  Pt seen for MST. BMI indicates underweight status. Pt reports he ate eggs and bacon for breakfast without nausea or abdominal pain. He reports fair to poor appetite PTA but denies taste alterations while on chemo; noted that last chemo was 1 month ago.  He states that he does not drink nutrition supplements at home. Due to poor appetite and decreased intakes, he states he has lost unknown amount of weight. Per weight hx review, pt has lost 26 lbs (17% body weight) in the past 3 months which is significant for time frame.  Not meeting needs PTA. Severe muscle and moderate and severe fat wasting noted. Medications reviewed. Labs reviewed; K: 3.3 mmol/L, BUN low, creatinine elevated, Mg: 1.3 mg/dL.  Height:  Ht Readings from Last 1 Encounters:  02/08/15 6' (1.829 m)    Weight:  Wt Readings from Last 1 Encounters:  02/08/15 130 lb  8.2 oz (59.2 kg)    Ideal Body Weight:  80.9 kg (kg)  Wt Readings from Last 10 Encounters:  02/08/15 130 lb 8.2 oz (59.2 kg)  01/30/15 127 lb 9.6 oz (57.879 kg)  01/14/15 150 lb (68.04 kg)  01/09/15 139 lb 11.2 oz (63.368 kg)  12/19/14 148 lb 14.4 oz (67.541 kg)  11/28/14 161 lb 12.8 oz (73.392 kg)  11/10/14 151 lb 9.6 oz (68.765 kg)  11/07/14 156 lb (70.761 kg)  10/17/14 153 lb (69.4 kg)  10/07/14 155 lb 1.6 oz (70.353 kg)    BMI:  Body mass index is 17.7 kg/(m^2).  Estimated Nutritional Needs:  Kcal:  1770-1970  Protein:  70-80 grams  Fluid:  2 L/day  Skin:  Reviewed, no issues  Diet Order:  Diet regular Room service appropriate?: Yes; Fluid consistency:: Thin  EDUCATION NEEDS:  No education needs identified at this time   Intake/Output Summary (Last 24 hours) at 02/08/15 1120 Last data filed at 02/08/15 0900  Gross per 24 hour  Intake 1021.67 ml  Output    175 ml  Net 846.67 ml    Last BM:  PTA   Jarome Matin, RD, LDN Inpatient Clinical Dietitian Pager # [phone] After hours/weekend pager # [phone]

## 2015-02-08 NOTE — Procedures (Signed)
ELECTROENCEPHALOGRAM REPORT   Patient: Brendan Hooper       Room #: AY3016 EEG No. ID: [id number] Age: 69 y.o.        Sex: male Referring Physician: Wendee Beavers Report Date:  02/08/2015        Interpreting Physician: Alexis Goodell  History: [name] is an 69 y.o. male presenting after an episode of loss of consciousness  Medications:  Scheduled: . enoxaparin (LOVENOX) injection  40 mg Subcutaneous Q24H  . feeding supplement (ENSURE ENLIVE)  237 mL Oral BID BM  . feeding supplement (RESOURCE BREEZE)  1 Container Oral BID BM  . folic acid  1 mg Oral Daily  . multivitamin with minerals  1 tablet Oral Daily  . piperacillin-tazobactam (ZOSYN)  IV  3.375 g Intravenous Q8H  . potassium chloride SA  20 mEq Oral BID  . thiamine  100 mg Oral Daily   Or  . thiamine  100 mg Intravenous Daily  . thiamine  100 mg Oral Daily  . vancomycin  750 mg Intravenous Q12H    Conditions of Recording:  This is a 16 channel EEG carried out with the patient in the awake and drowsy states.  Description:  The waking background activity consists of a low voltage, symmetrical, fairly well organized, 8 Hz alpha activity, seen from the parieto-occipital and posterior temporal regions.  Low voltage fast activity, poorly organized, is seen anteriorly and is at times superimposed on more posterior regions.  A mixture of theta and alpha rhythms are seen from the central and temporal regions. The patient drowses with slowing to irregular, low voltage theta and beta activity.   Stage II sleep is not obtained. Hyperventilation and intermittent photic stimulation were not performed.   IMPRESSION: Normal electroencephalogram, awake and drowsy. There are no focal lateralizing or epileptiform features.   Alexis Goodell, MD [facility] [phone] 02/08/2015, 6:10 PM

## 2015-02-08 NOTE — Progress Notes (Signed)
Mag is 1.3.  Baltazar Najjar on call and notified.

## 2015-02-08 NOTE — H&P (Signed)
[facility] History and Physical  Brendan Hooper DPO:[id number] DOB: 04/08/1946 DOA: 02/07/2015  Referring physician: Mr.Gieple. PCP: [name], MD  Specialists: Dr.Mohammed.  Chief Complaint: Loss of consciousness.  HPI: Brendan Hooper is a 69 y.o. male with history of non-small cell lung cancer on chemotherapy, last chemotherapy last month presents to the ER after patient had brief episode of loss of consciousness. Patient states he was trying to walk towards the hall in his house when suddenly he lost consciousness and fell. Patient thinks he may have lost consciousness for less than a minute. Did not have any incontinence of urine or bowel. Did not have any confusion after the fall. EMS was called and patient was brought to the ER. Patient was found to be hypotensive and orthostatic on arrival and patient was given fluid bolus for which patient's heart rate and blood pressure improved. Patient denies any nausea vomiting abdominal pain diarrhea chest pain shortness of breath or any productive cough. Chest x-ray shows mass with postobstructive is paced disease. Patient has been admitted for syncope.   Review of Systems: As presented in the history of presenting illness, rest negative.  Past Medical History  Diagnosis Date  . GERD (gastroesophageal reflux disease)   . Headache(784.0)   . Sickle cell anemia   . Lung mass   . Hypertension   . 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 No past surgeries    . Portacath placement Left 09/01/2014    Procedure: INSERTION PORT-A-CATH;  Surgeon: Melrose Nakayama, MD;  Location: [facility];  Service: Thoracic;  Laterality: Left;   Social History:  reports that he has been smoking Cigarettes.  He has a 10 pack-year smoking history. He has never used smokeless tobacco. He reports that he drinks alcohol. He reports that he  does not use illicit drugs. Where does patient live home. Can patient participate in ADLs? Yes.  No Known Allergies  Family History:  Family History  Problem Relation Age of Onset  . Cancer Mother   . Stroke Father       Prior to Admission medications   Medication Sig Start Date End Date Taking? Authorizing Provider  dexamethasone (DECADRON) 4 MG tablet 4 mg by mouth twice a day the day before, day of and day after the chemotherapy every 3 weeks 12/14/14  Yes Curt Bears, MD  fluconazole (DIFLUCAN) 100 MG tablet Take 1 tablet (100 mg total) by mouth daily. 01/09/15  Yes Carlton Adam, PA-C  folic acid (FOLVITE) 1 MG tablet Take 1 tablet (1 mg total) by mouth daily. 08/11/14  Yes Modena Jansky, MD  lidocaine-prilocaine (EMLA) cream Apply 1 application topically as needed. 09/02/14  Yes Curt Bears, MD  Multiple Vitamin (MULTIVITAMIN WITH MINERALS) TABS tablet Take 1 tablet by mouth daily. 08/11/14  Yes Modena Jansky, MD  nicotine (NICODERM CQ - DOSED IN MG/24 HOURS) 21 mg/24hr patch Place 1 patch (21 mg total) onto the skin daily. 12/15/14  Yes Curt Bears, MD  oxyCODONE (OXY IR/ROXICODONE) 5 MG immediate release tablet Take 1-2 tablets (5-10 mg total) by mouth every 4 (four) hours as needed for moderate pain. 01/09/15  Yes Adrena E Johnson, PA-C  potassium chloride SA (K-DUR,KLOR-CON) 20 MEQ tablet Take 1 tablet (20 mEq total) by mouth 2 (two) times daily. 01/02/15  Yes Curt Bears, MD  prochlorperazine (  COMPAZINE) 10 MG tablet Take 1 tablet (10 mg total) by mouth every 6 (six) hours as needed for nausea or vomiting. 01/20/15  Yes Carlton Adam, PA-C  thiamine 100 MG tablet Take 1 tablet (100 mg total) by mouth daily. 08/11/14  Yes Modena Jansky, MD    Physical Exam: Filed Vitals:   02/07/15 2200 02/07/15 2300 02/07/15 2330 02/08/15 0015  BP: 106/63 116/71 110/67 134/81  Pulse: 102     Temp:    97.7 F (36.5 C)  TempSrc:    Oral  Resp: '19 25 17 16  '$ Height:     6' (1.829 m)  Weight:    59.2 kg (130 lb 8.2 oz)  SpO2: 100%   99%     General:  Moderately built and nourished.  Eyes: Anicteric no pallor.  ENT: No discharge from the ears eyes nose more.  Neck: No mass felt. No neck rigidity.  Cardiovascular: S1 and S2 heard.  Respiratory: No rhonchi or crepitations.  Abdomen: Soft nontender bowel sounds present.  Skin: No rash.  Musculoskeletal: No edema.  Psychiatric: Appears normal.  Neurologic: Alert awake oriented to time place and person. Moves all extremities.  Labs on Admission:  Basic Metabolic Panel:  Recent Labs Lab 02/07/15 2237  NA 142  K 2.5*  CL 101  CO2 29  GLUCOSE 126*  BUN 7  CREATININE 0.57*  CALCIUM 9.4   Liver Function Tests:  Recent Labs Lab 02/07/15 2237  AST 17  ALT 8*  ALKPHOS 61  BILITOT 0.4  PROT 6.5  ALBUMIN 3.0*   No results for input(s): LIPASE, AMYLASE in the last 168 hours. No results for input(s): AMMONIA in the last 168 hours. CBC:  Recent Labs Lab 02/07/15 2237  WBC 7.0  NEUTROABS 4.9  HGB 11.1*  HCT 32.6*  MCV 81.5  PLT 268   Cardiac Enzymes: No results for input(s): CKTOTAL, CKMB, CKMBINDEX, TROPONINI in the last 168 hours.  BNP (last 3 results) No results for input(s): BNP in the last 8760 hours.  ProBNP (last 3 results) No results for input(s): PROBNP in the last 8760 hours.  CBG: No results for input(s): GLUCAP in the last 168 hours.  Radiological Exams on Admission: Dg Chest 2 View  02/07/2015   CLINICAL DATA:  Acute onset of dizziness and syncope. Status post fall. Initial encounter.  EXAM: CHEST  2 VIEW  COMPARISON:  CT of the chest performed 01/27/2015  FINDINGS: The patient's right middle lobe lung mass is again noted, with overlying peripheral right-sided airspace opacification. No pleural effusion or pneumothorax identified.  The heart remains normal in size. A chest port is noted at the left chest wall, ending at the mid SVC. No acute osseous  abnormalities are identified.  IMPRESSION: Right middle lobe lung mass again noted, with overlying right-sided airspace opacification. No displaced rib fracture seen.   Electronically Signed   By: Garald Balding M.D.   On: 02/07/2015 22:28   Ct Head Wo Contrast  02/08/2015   CLINICAL DATA:  Acute onset of syncope. Hypotension. Dizziness. Current history of lung metastases to the brain. Initial encounter.  EXAM: CT HEAD WITHOUT CONTRAST  TECHNIQUE: Contiguous axial images were obtained from the base of the skull through the vertex without intravenous contrast.  COMPARISON:  PET/CT performed 09/02/2014, and MRI of the brain performed 09/13/2014  FINDINGS: The patient's previously noted brain metastases are not well characterized on CT. Mild prominence of the ventricles and sulci suggest mild cortical volume loss.  Mild cerebellar atrophy is noted. Scattered periventricular white matter change likely reflects small vessel ischemic microangiopathy. No mass effect or midline shift is seen.  There is no evidence of fracture; visualized osseous structures are unremarkable in appearance. The visualized portions of the orbits are within normal limits. The paranasal sinuses and mastoid air cells are well-aerated. No significant soft tissue abnormalities are seen.  IMPRESSION: 1. Previously noted brain metastases are not well characterized on CT. 2. Mild cortical volume loss and mild scattered small vessel ischemic microangiopathy noted.   Electronically Signed   By: Garald Balding M.D.   On: 02/08/2015 00:32    EKG: Independently reviewed. Sinus tachycardia with PVCs.  Assessment/Plan Principal Problem:   Syncope Active Problems:   Hypokalemia   Bronchogenic cancer of right lung   Neck pain   1. Syncope - patient was found to be orthostatic in the ER probably causing patient's syncope. However given patient's history of alcoholism we will check EEG. Patient is still complaining of some neck pain for which I  have ordered CT C-spine. Closely monitor in telemetry for any arrhythmias as patient also has hypokalemia. 2. Hypokalemia - could be from poor oral intake. Check magnesium levels. Replace potassium and recheck metabolic panel. 3. Neck pain after fall - C-spine CT is pending. 4. Alcoholism - patient states his last drink was yesterday. Patient has been placed on alcohol withdrawal protocol. 5. Abnormal chest x-ray - patient's chest x-ray does show some postobstructive airspace disease. I have placed patient on empiric antibiotics for now and ordered pro-calcitonin levels. If procalcitonin levels are negative then may discontinue antibiotics as patient is asymptomatic. 6. Non-small cell lung cancer per oncologist. 7. Chronic anemia - follow CBC.   DVT Prophylaxis Lovenox.  Code Status: Full code.  Family Communication: Discussed with patient.  Disposition Plan: Admit to inpatient.    Dallon Dacosta N. [facility] Pager [phone].  If 7PM-7AM, please contact night-coverage [email or web address] Password [email or web address] 02/08/2015, 1:46 AM

## 2015-02-08 NOTE — Progress Notes (Signed)
ANTIBIOTIC CONSULT NOTE - INITIAL  Pharmacy Consult for Zosyn/Vancomycin Indication: HCAP  No Known Allergies  Patient Measurements: Height: 6' (182.9 cm) Weight: 130 lb 8.2 oz (59.2 kg) IBW/kg (Calculated) : 77.6   Vital Signs: Temp: 98 F (36.7 C) (06/22 0506) Temp Source: Oral (06/22 0506) BP: 106/65 mmHg (06/22 0506) Pulse Rate: 103 (06/22 0506) Intake/Output from previous day: 06/21 0701 - 06/22 0700 In: 1021.7 [P.O.:120; I.V.:351.7; IV Piggyback:550] Out: -  Intake/Output from this shift: Total I/O In: 1021.7 [P.O.:120; I.V.:351.7; IV Piggyback:550] Out: -   Labs:  Recent Labs  02/07/15 2237  WBC 7.0  HGB 11.1*  PLT 268  CREATININE 0.57*   Estimated Creatinine Clearance: 73 mL/min (by C-G formula based on Cr of 0.57). No results for input(s): VANCOTROUGH, VANCOPEAK, VANCORANDOM, GENTTROUGH, GENTPEAK, GENTRANDOM, TOBRATROUGH, TOBRAPEAK, TOBRARND, AMIKACINPEAK, AMIKACINTROU, AMIKACIN in the last 72 hours.   Microbiology: Recent Results (from the past 720 hour(s))  Urine culture     Status: None   Collection Time: 01/14/15  9:23 PM  Result Value Ref Range Status   Specimen Description URINE, CLEAN CATCH  Final   Special Requests ADDED 2215  Final   Colony Count NO GROWTH Performed at [facility]   Final   Culture NO GROWTH Performed at [facility]   Final   Report Status 01/16/2015 FINAL  Final    Medical History: Past Medical History  Diagnosis Date  . GERD (gastroesophageal reflux disease)   . Headache(784.0)   . Sickle cell anemia   . Lung mass   . Hypertension   . 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 dexamethasone (DECADRON) 4 MG tablet 4 mg by mouth twice a day the day before, day of and day after the chemotherapy every 3 weeks 40 tablet 1 Past Week at  Unknown time  . fluconazole (DIFLUCAN) 100 MG tablet Take 1 tablet (100 mg total) by mouth daily. 14 tablet 0 02/07/2015 at Unknown time  . folic acid (FOLVITE) 1 MG tablet Take 1 tablet (1 mg total) by mouth daily. 30 tablet 0 02/07/2015 at Unknown time  . lidocaine-prilocaine (EMLA) cream Apply 1 application topically as needed. 30 g 1 02/07/2015 at Unknown time  . Multiple Vitamin (MULTIVITAMIN WITH MINERALS) TABS tablet Take 1 tablet by mouth daily. 30 tablet 0 02/07/2015 at Unknown time  . nicotine (NICODERM CQ - DOSED IN MG/24 HOURS) 21 mg/24hr patch Place 1 patch (21 mg total) onto the skin daily. 28 patch 0 Past Week at Unknown time  . oxyCODONE (OXY IR/ROXICODONE) 5 MG immediate release tablet Take 1-2 tablets (5-10 mg total) by mouth every 4 (four) hours as needed for moderate pain. 45 tablet 0 02/07/2015 at Unknown time  . potassium chloride SA (K-DUR,KLOR-CON) 20 MEQ tablet Take 1 tablet (20 mEq total) by mouth 2 (two) times daily. 14 tablet 0 02/07/2015 at Unknown time  . prochlorperazine (COMPAZINE) 10 MG tablet Take 1 tablet (10 mg total) by mouth every 6 (six) hours as needed for nausea or vomiting. 60 tablet 1 Past Week at Unknown time  . thiamine 100 MG tablet Take 1 tablet (100 mg total) by mouth daily. 30 tablet 0 02/07/2015 at Unknown time   Scheduled:  . enoxaparin (LOVENOX) injection  40 mg Subcutaneous Q24H  . feeding supplement (  ENSURE ENLIVE)  237 mL Oral BID BM  . folic acid  1 mg Oral Daily  . folic acid  1 mg Oral Daily  . magnesium sulfate 1 - 4 g bolus IVPB  2 g Intravenous Once  . multivitamin with minerals  1 tablet Oral Daily  . multivitamin with minerals  1 tablet Oral Daily  . piperacillin-tazobactam  3.375 g Intravenous Once  . potassium chloride SA  20 mEq Oral BID  . thiamine  100 mg Oral Daily   Or  . thiamine  100 mg Intravenous Daily  . thiamine  100 mg Oral Daily  . vancomycin  750 mg Intravenous Q12H   Infusions:  . 0.9 % NaCl with KCl 20 mEq / L 100  mL/hr (02/08/15 0210)   Assessment: 60 yoM presents with dizziness, fall, LOC.  Now with hypotension.  Zosyn and Vancomycin per Rx for HCAP.   Goal of Therapy:  Vancomycin trough level 15-20 mcg/ml  Plan:   Zosyn 3.375 gm IV q8h EI  Vancomcyin 750 mg IV q12h  F/u SCr/cultures/levels  Lawana Pai R 02/08/2015,6:19 AM

## 2015-02-08 NOTE — Progress Notes (Signed)
Patient seen and evaluated earlier this AM by my associate. Please refer to H and P regarding assessment and plan.  Will reassess next am. Hypomagnesemia addressed already. Will recheck levels next am.  Brendan Hooper  Pt in nad, alert and awake CV: normal s1 and s2, no rubs Pulmonology: no increased wob, no wheezes

## 2015-02-08 NOTE — Progress Notes (Signed)
Offsite EEG completed at [facility]; results pending.

## 2015-02-09 DIAGNOSIS — C3491 Malignant neoplasm of unspecified part of right bronchus or lung: Secondary | ICD-10-CM

## 2015-02-09 DIAGNOSIS — R55 Syncope and collapse: Secondary | ICD-10-CM

## 2015-02-09 DIAGNOSIS — E876 Hypokalemia: Secondary | ICD-10-CM

## 2015-02-09 LAB — MAGNESIUM: MAGNESIUM: 1.4 mg/dL — AB (ref 1.7–2.4)

## 2015-02-09 MED ORDER — LEVOFLOXACIN 750 MG PO TABS
750.0000 mg | ORAL_TABLET | Freq: Every day | ORAL | Status: DC
  Administered 2015-02-09 – 2015-02-11 (×3): 750 mg via ORAL
  Filled 2015-02-09 (×3): qty 1

## 2015-02-09 NOTE — Progress Notes (Signed)
[facility] PROGRESS NOTE  Brendan Hooper MVE:[id number] DOB: 03/21/1946 DOA: 02/07/2015 PCP: [name], MD  Assessment/Plan: Principal Problem:   Syncope - Currently resolved, no seizure-like activity reported. -EEG completed and reporting normal electro encephalogram - Most likely due to decreased intravascular volume - We'll obtain PT evaluation prior to discharge  Active Problems:   Hypokalemia - Improved after replacement we'll reassess next a.m.    Bronchogenic cancer of right lung - Patient to follow-up with oncologist after discharge    Hypomagnesemia - On repeat 1.4 as such will plan on replacing reassessing next a.m.  RML lung mass with overlying right sided airspace opacification - transition to levaquin  Code Status: full Family Communication: no family at bedside  Disposition Plan: Requires PT evaluation prior to discharge   Consultants:  None  Procedures:  EEG  Antibiotics:  Levaquin  HPI/Subjective: Pt has no new complaints.   Objective: Filed Vitals:   02/09/15 1429  BP: 100/60  Pulse: 103  Temp: 98.2 F (36.8 C)  Resp: 18    Intake/Output Summary (Last 24 hours) at 02/09/15 1624 Last data filed at 02/09/15 1430  Gross per 24 hour  Intake   2640 ml  Output   1275 ml  Net   1365 ml   Filed Weights   02/08/15 0015  Weight: 59.2 kg (130 lb 8.2 oz)    Exam:   General:  Pt in nad, alert and awake  Cardiovascular: rrr, no mrg  Respiratory: no increased wob, equal chest rise, no wheezes  Abdomen: soft, NT, ND  Musculoskeletal: No cyanosis or clubbing  Data Reviewed: Basic Metabolic Panel:  Recent Labs Lab 02/07/15 2237 02/08/15 0222 02/08/15 0630 02/09/15 0434  NA 142  --  137  --   K 2.5*  --  3.3*  --   CL 101  --  105  --   CO2 29  --  25  --   GLUCOSE 126*  --  109*  --   BUN 7  --  <5*  --   CREATININE 0.57*  --  0.53*  --   CALCIUM 9.4  --  8.9  --   MG  --  1.3*  --  1.4*   Liver Function  Tests:  Recent Labs Lab 02/07/15 2237  AST 17  ALT 8*  ALKPHOS 61  BILITOT 0.4  PROT 6.5  ALBUMIN 3.0*   No results for input(s): LIPASE, AMYLASE in the last 168 hours. No results for input(s): AMMONIA in the last 168 hours. CBC:  Recent Labs Lab 02/07/15 2237 02/08/15 0630  WBC 7.0 5.9  NEUTROABS 4.9 4.0  HGB 11.1* 9.5*  HCT 32.6* 27.1*  MCV 81.5 81.4  PLT 268 263   Cardiac Enzymes: No results for input(s): CKTOTAL, CKMB, CKMBINDEX, TROPONINI in the last 168 hours. BNP (last 3 results) No results for input(s): BNP in the last 8760 hours.  ProBNP (last 3 results) No results for input(s): PROBNP in the last 8760 hours.  CBG: No results for input(s): GLUCAP in the last 168 hours.  No results found for this or any previous visit (from the past 240 hour(s)).   Studies: Dg Chest 2 View  02/07/2015   CLINICAL DATA:  Acute onset of dizziness and syncope. Status post fall. Initial encounter.  EXAM: CHEST  2 VIEW  COMPARISON:  CT of the chest performed 01/27/2015  FINDINGS: The patient's right middle lobe lung mass is again noted, with overlying peripheral right-sided airspace opacification. No pleural  effusion or pneumothorax identified.  The heart remains normal in size. A chest port is noted at the left chest wall, ending at the mid SVC. No acute osseous abnormalities are identified.  IMPRESSION: Right middle lobe lung mass again noted, with overlying right-sided airspace opacification. No displaced rib fracture seen.   Electronically Signed   By: Garald Balding M.D.   On: 02/07/2015 22:28   Ct Head Wo Contrast  02/08/2015   CLINICAL DATA:  Acute onset of syncope. Hypotension. Dizziness. Current history of lung metastases to the brain. Initial encounter.  EXAM: CT HEAD WITHOUT CONTRAST  TECHNIQUE: Contiguous axial images were obtained from the base of the skull through the vertex without intravenous contrast.  COMPARISON:  PET/CT performed 09/02/2014, and MRI of the brain  performed 09/13/2014  FINDINGS: The patient's previously noted brain metastases are not well characterized on CT. Mild prominence of the ventricles and sulci suggest mild cortical volume loss. Mild cerebellar atrophy is noted. Scattered periventricular white matter change likely reflects small vessel ischemic microangiopathy. No mass effect or midline shift is seen.  There is no evidence of fracture; visualized osseous structures are unremarkable in appearance. The visualized portions of the orbits are within normal limits. The paranasal sinuses and mastoid air cells are well-aerated. No significant soft tissue abnormalities are seen.  IMPRESSION: 1. Previously noted brain metastases are not well characterized on CT. 2. Mild cortical volume loss and mild scattered small vessel ischemic microangiopathy noted.   Electronically Signed   By: Garald Balding M.D.   On: 02/08/2015 00:32   Ct Cervical Spine Wo Contrast  02/08/2015   ADDENDUM REPORT: 02/08/2015 09:26  ADDENDUM: In the body of the report the sentence should read there are now noted lytic lesions identified in the spinous process of C2 as well as within the vertebral body eccentric to the left at T1.   Electronically Signed   By: Inez Catalina M.D.   On: 02/08/2015 09:26   02/08/2015   CLINICAL DATA:  Bilateral neck pain, history of non small cell lung carcinoma with known metastatic lesions to the brain  EXAM: CT CERVICAL SPINE WITHOUT CONTRAST  TECHNIQUE: Multidetector CT imaging of the cervical spine was performed without intravenous contrast. Multiplanar CT image reconstructions were also generated.  COMPARISON:  09/02/2014  FINDINGS: Seven cervical segments are well visualized. Vertebral body height is well maintained. There are now noted lytic lesions identified in the spinous process of C2 as well as within the body E centric to the left at T1. These were not present on the prior PET-CT examination in January of 2016 and are consistent with  progressive bony metastatic lesions. No definitive involvement of the spinal canal is noted although this examination is limited with regards to evaluation of the spinal canal soft tissues.  Degenerative changes are noted at C5-6 and C6-7. No acute fracture or acute facet abnormality is identified. Surrounding soft tissues are within normal limits. The visualized lung apices demonstrate a small pleural based nodule in the right upper lobe posteriorly which measures 7-8 mm. No other significant soft tissue abnormality is noted.  IMPRESSION: Progressive bony metastatic disease at C2 posteriorly and T1 within the vertebral body.  Degenerative changes without acute abnormality.  Small right upper lobe nodule  These results will be called to the ordering clinician or representative by the Radiologist Assistant, and communication documented in the PACS or zVision Dashboard.  Electronically Signed: By: Inez Catalina M.D. On: 02/08/2015 08:27    Scheduled Meds: .  enoxaparin (LOVENOX) injection  40 mg Subcutaneous Q24H  . feeding supplement (ENSURE ENLIVE)  237 mL Oral BID BM  . feeding supplement (RESOURCE BREEZE)  1 Container Oral BID BM  . folic acid  1 mg Oral Daily  . multivitamin with minerals  1 tablet Oral Daily  . piperacillin-tazobactam (ZOSYN)  IV  3.375 g Intravenous Q8H  . potassium chloride SA  20 mEq Oral BID  . thiamine  100 mg Oral Daily  . vancomycin  750 mg Intravenous Q12H   Continuous Infusions:    Time spent: > 35 minutes    Velvet Bathe  [facility] Pager [phone] If 7PM-7AM, please contact night-coverage at [email or web address], password [facility] 02/09/2015, 4:24 PM  LOS: 2 days

## 2015-02-09 NOTE — Progress Notes (Signed)
ANTIBIOTIC CONSULT NOTE - FOLLOW UP  Pharmacy Consult for Levaquin Indication: CAP  No Known Allergies  Patient Measurements: Height: 6' (182.9 cm) Weight: 130 lb 8.2 oz (59.2 kg) IBW/kg (Calculated) : 77.6  Vital Signs: Temp: 98.2 F (36.8 C) (06/23 1429) Temp Source: Oral (06/23 1429) BP: 100/60 mmHg (06/23 1429) Pulse Rate: 103 (06/23 1429) Intake/Output from previous day: 06/22 0701 - 06/23 0700 In: 2400 [I.V.:2100; IV Piggyback:300] Out: 1050 [Urine:1050] Intake/Output from this shift: Total I/O In: 240 [P.O.:240] Out: 400 [Urine:400]  Labs:  Recent Labs  02/07/15 2237 02/08/15 0630  WBC 7.0 5.9  HGB 11.1* 9.5*  PLT 268 263  CREATININE 0.57* 0.53*   Estimated Creatinine Clearance: 73 mL/min (by C-G formula based on Cr of 0.53). No results for input(s): VANCOTROUGH, VANCOPEAK, VANCORANDOM, GENTTROUGH, GENTPEAK, GENTRANDOM, TOBRATROUGH, TOBRAPEAK, TOBRARND, AMIKACINPEAK, AMIKACINTROU, AMIKACIN in the last 72 hours.   Microbiology: Recent Results (from the past 720 hour(s))  Urine culture     Status: None   Collection Time: 01/14/15  9:23 PM  Result Value Ref Range Status   Specimen Description URINE, CLEAN CATCH  Final   Special Requests ADDED 2215  Final   Colony Count NO GROWTH Performed at [facility]   Final   Culture NO GROWTH Performed at [facility]   Final   Report Status 01/16/2015 FINAL  Final    Anti-infectives    Start     Dose/Rate Route Frequency Ordered Stop   02/09/15 1800  levofloxacin (LEVAQUIN) tablet 750 mg     750 mg Oral Daily-1800 02/09/15 1640     02/08/15 1400  piperacillin-tazobactam (ZOSYN) IVPB 3.375 g  Status:  Discontinued     3.375 g 12.5 mL/hr over 240 Minutes Intravenous Every 8 hours 02/08/15 0623 02/09/15 1629   02/08/15 0630  piperacillin-tazobactam (ZOSYN) IVPB 3.375 g     3.375 g 100 mL/hr over 30 Minutes Intravenous  Once 02/08/15 0615 02/08/15 0721   02/08/15 0630  vancomycin (VANCOCIN)  IVPB 750 mg/150 ml premix  Status:  Discontinued     750 mg 150 mL/hr over 60 Minutes Intravenous Every 12 hours 02/08/15 0615 02/09/15 1629      Assessment: 69 yo male with hx NSCLC s/p chemotherapy who presents with syncope. Pharmacy was consulted 6/22 to dose vancomycin and zosyn since CXR showed postobstructive airspace disease. Today, 6/23, we are now consulted to change to levaquin.  6/22 >> zosyn  >> 6/23 6/22 >> vancomycin  >> 6/23 6/23 >> levaquin >>  Tmax: afebrile since admit WBC: wnl Renal: SCr stable, CrCl 73 ml/min Procalcitonin: 18.74  No cultures obtained  Goal of Therapy:  Eradication of infection Levaquin dose appropriate for indication and renal function  Plan:   Levaquin '750mg'$  PO q24h  Continue to monitor renal function, QTc interval, clinical course  Peggyann Juba, PharmD, BCPS Pager: [phone] 02/09/2015,4:41 PM

## 2015-02-09 NOTE — Clinical Documentation Improvement (Signed)
Possible Clinical Conditions?  Severe Malnutrition   Protein Calorie Malnutrition Severe Protein Calorie Malnutrition Other Condition Cannot clinically determine  Supporting Information: Author: Rosezetta Schlatter, RD Service: Nutritional Management Author Type: Registered Dietitian    Filed: 02/08/2015 11:27 AM Note Time: 02/08/2015 11:19 AM Status: Signed   Editor: Rosezetta Schlatter, RD (Registered Dietitian)     Expand All Collapse All   Initial Nutrition Assessment DOCUMENTATION CODES: Severe malnutrition in context of chronic illness, Underweight INTERVENTION: - Will order Resource Breeze po BID, each supplement provides 250 kcal and 9 grams of protein - Continue Ensure Enlive po BID, each supplement provides 350 kcal and 20 grams of protein - RD will continue to monitor for needs NUTRITION DIAGNOSIS: Increased nutrient needs related to chronic illness, cancer and cancer related treatments as evidenced by estimated needs.      Thank You, Alessandra Grout, RN, BSN, CCDS,Clinical Documentation Specialist:  [phone]  [phone]=[facility]- Health Information Management

## 2015-02-10 LAB — PROCALCITONIN: Procalcitonin: 23.68 ng/mL

## 2015-02-10 MED ORDER — SODIUM CHLORIDE 0.9 % IV BOLUS (SEPSIS)
1000.0000 mL | Freq: Once | INTRAVENOUS | Status: AC
  Administered 2015-02-10: 1000 mL via INTRAVENOUS

## 2015-02-10 MED ORDER — SODIUM CHLORIDE 0.9 % IV SOLN
INTRAVENOUS | Status: DC
  Administered 2015-02-10: 125 mL/h via INTRAVENOUS
  Administered 2015-02-11 – 2015-02-12 (×5): via INTRAVENOUS

## 2015-02-10 NOTE — Evaluation (Addendum)
Physical Therapy Evaluation Patient Details Name: Khaza Blansett MRN: [id number] DOB: 06/09/1946 Today's Date: 02/10/2015   History of Present Illness  69 yo male admitted with syncope, fall, hypotension, neck pain-progressive bony mets to C2, T1. Pt lives in a motel.   Clinical Impression  On eval, pt required Min guard assist for mobility-able to ambulate ~115 feet. + orthostatics (see vitals section) but pt denies lightheadedness/dizziness. After ambulation, BP 79/59. Pt states he has access to DME. At this time, recommend home safety evaluation.     Follow Up Recommendations  (home safety evaluation)    Equipment Recommendations  None recommended by PT (pt states he has access to DME)    Recommendations for Other Services       Precautions / Restrictions Precautions Precautions: Fall Precaution Comments: +orthostatics Restrictions Weight Bearing Restrictions: No      Mobility  Bed Mobility Overal bed mobility: Modified Independent                Transfers Overall transfer level: Modified independent               General transfer comment: Increased time. Nees 1 hand support to rise, descend  Ambulation/Gait Ambulation/Gait assistance: Min guard Ambulation Distance (Feet): 115 Feet Assistive device: None Gait Pattern/deviations: Wide base of support;Decreased stride length;Decreased step length - right;Decreased step length - left     General Gait Details: slow gait speed. Pt denied lightheadedness/dizziness.   Stairs            Wheelchair Mobility    Modified Rankin (Stroke Patients Only)       Balance Overall balance assessment: History of Falls;Needs assistance         Standing balance support: During functional activity Standing balance-Leahy Scale: Fair                               Pertinent Vitals/Pain Pain Assessment: 0-10 Pain Score: 8  Pain Location: neck especially with attempts to rotate Pain Descriptors  / Indicators: Aching;Sore Pain Intervention(s): Monitored during session    Home Living Family/patient expects to be discharged to:: Other (Comment) (motel) Living Arrangements: Non-relatives/Friends Available Help at Discharge:  (roommate?)   Home Access: Stairs to enter   Technical brewer of Steps: 1 Home Layout: One level Home Equipment: Environmental consultant - 2 wheels;Cane - single point; Wheelchair      Prior Function Level of Independence: Independent               Hand Dominance        Extremity/Trunk Assessment   Upper Extremity Assessment: Overall WFL for tasks assessed           Lower Extremity Assessment: Generalized weakness      Cervical / Trunk Assessment: Kyphotic  Communication   Communication: No difficulties  Cognition Arousal/Alertness: Awake/alert Behavior During Therapy: WFL for tasks assessed/performed Overall Cognitive Status: Within Functional Limits for tasks assessed                      General Comments      Exercises        Assessment/Plan    PT Assessment Patient needs continued PT services  PT Diagnosis Acute pain;Generalized weakness;Difficulty walking   PT Problem List Decreased strength;Decreased range of motion;Decreased activity tolerance;Decreased balance;Decreased mobility;Pain  PT Treatment Interventions DME instruction;Gait training;Functional mobility training;Therapeutic exercise;Therapeutic activities;Patient/family education;Balance training   PT Goals (Current goals can be found in  the Care Plan section) Acute Rehab PT Goals Patient Stated Goal: none stated PT Goal Formulation: With patient Time For Goal Achievement: 02/24/15 Potential to Achieve Goals: Good    Frequency Min 3X/week   Barriers to discharge        Co-evaluation               End of Session Equipment Utilized During Treatment: Gait belt Activity Tolerance: Patient tolerated treatment well Patient left: in bed;with call  bell/phone within reach;with bed alarm set           Time: 0910-0930 PT Time Calculation (min) (ACUTE ONLY): 20 min   Charges:   PT Evaluation $Initial PT Evaluation Tier I: 1 Procedure     PT G Codes:        Weston Anna, MPT Pager: [phone]

## 2015-02-10 NOTE — Progress Notes (Signed)
[facility] PROGRESS NOTE  Brendan Hooper TOI:[id number] DOB: [birth year] DOA: 02/07/2015 PCP: [name], MD  Assessment/Plan: Principal Problem:   Syncope - Currently resolved, no seizure-like activity reported. -EEG completed and reporting normal electro encephalogram - Most likely due to decreased intravascular volume as patient has been having orthostatic hypotension - We'll obtain PT evaluation prior to discharge - Administer IVF's and add ted hose  Active Problems:   Hypokalemia - Improved after replacement we'll reassess next a.m.    Bronchogenic cancer of right lung - Patient to follow-up with oncologist after discharge    Hypomagnesemia - On repeat 1.4 as such will plan on replacing reassessing next a.m.  RML lung mass with overlying right sided airspace opacification - transition to levaquin  Code Status: full Family Communication: no family at bedside  Disposition Plan: Requires PT evaluation prior to discharge   Consultants:  None  Procedures:  EEG  Antibiotics:  Levaquin  HPI/Subjective: Pt has no new complaints.  PT reports patient has orthostatic with no reported dizziness  Objective: Filed Vitals:   02/10/15 1321  BP: 118/70  Pulse: 102  Temp: 98.2 F (36.8 C)  Resp: 18    Intake/Output Summary (Last 24 hours) at 02/10/15 1622 Last data filed at 02/10/15 1528  Gross per 24 hour  Intake    120 ml  Output   1000 ml  Net   -880 ml   Filed Weights   02/08/15 0015  Weight: 59.2 kg (130 lb 8.2 oz)    Exam:   General:  Pt in nad, alert and awake  Cardiovascular: rrr, no mrg  Respiratory: no increased wob, equal chest rise, no wheezes  Abdomen: soft, NT, ND  Musculoskeletal: No cyanosis or clubbing  Data Reviewed: Basic Metabolic Panel:  Recent Labs Lab 02/07/15 2237 02/08/15 0222 02/08/15 0630 02/09/15 0434  NA 142  --  137  --   K 2.5*  --  3.3*  --   CL 101  --  105  --   CO2 29  --  25  --   GLUCOSE  126*  --  109*  --   BUN 7  --  <5*  --   CREATININE 0.57*  --  0.53*  --   CALCIUM 9.4  --  8.9  --   MG  --  1.3*  --  1.4*   Liver Function Tests:  Recent Labs Lab 02/07/15 2237  AST 17  ALT 8*  ALKPHOS 61  BILITOT 0.4  PROT 6.5  ALBUMIN 3.0*   No results for input(s): LIPASE, AMYLASE in the last 168 hours. No results for input(s): AMMONIA in the last 168 hours. CBC:  Recent Labs Lab 02/07/15 2237 02/08/15 0630  WBC 7.0 5.9  NEUTROABS 4.9 4.0  HGB 11.1* 9.5*  HCT 32.6* 27.1*  MCV 81.5 81.4  PLT 268 263   Cardiac Enzymes: No results for input(s): CKTOTAL, CKMB, CKMBINDEX, TROPONINI in the last 168 hours. BNP (last 3 results) No results for input(s): BNP in the last 8760 hours.  ProBNP (last 3 results) No results for input(s): PROBNP in the last 8760 hours.  CBG: No results for input(s): GLUCAP in the last 168 hours.  No results found for this or any previous visit (from the past 240 hour(s)).   Studies: No results found.  Scheduled Meds: . enoxaparin (LOVENOX) injection  40 mg Subcutaneous Q24H  . feeding supplement (ENSURE ENLIVE)  237 mL Oral BID BM  . feeding supplement (RESOURCE BREEZE)  1 Container Oral BID BM  . folic acid  1 mg Oral Daily  . levofloxacin  750 mg Oral q1800  . multivitamin with minerals  1 tablet Oral Daily  . potassium chloride SA  20 mEq Oral BID  . sodium chloride  1,000 mL Intravenous Once  . thiamine  100 mg Oral Daily   Continuous Infusions:    Time spent: > 35 minutes    Velvet Bathe  [facility] Pager [phone] If 7PM-7AM, please contact night-coverage at [email or web address], password [facility] 02/10/2015, 4:22 PM  LOS: 3 days

## 2015-02-11 LAB — BASIC METABOLIC PANEL
ANION GAP: 9 (ref 5–15)
BUN: <5 mg/dL — ABNORMAL LOW (ref 6–20)
CO2: 25 mmol/L (ref 22–32)
CREATININE: 0.6 mg/dL — AB (ref 0.61–1.24)
Calcium: 8.9 mg/dL (ref 8.9–10.3)
Chloride: 105 mmol/L (ref 101–111)
GFR calc Af Amer: >60 mL/min (ref >60)
GFR calc non Af Amer: >60 mL/min (ref >60)
Glucose, Bld: 102 mg/dL — ABNORMAL HIGH (ref 65–99)
Potassium: 3.1 mmol/L — ABNORMAL LOW (ref 3.5–5.1)
SODIUM: 139 mmol/L (ref 135–145)

## 2015-02-11 LAB — MAGNESIUM: Magnesium: 1.2 mg/dL — ABNORMAL LOW (ref 1.7–2.4)

## 2015-02-11 MED ORDER — MAGNESIUM SULFATE 2 GM/50ML IV SOLN
2.0000 g | Freq: Once | INTRAVENOUS | Status: AC
  Administered 2015-02-11: 2 g via INTRAVENOUS
  Filled 2015-02-11: qty 50

## 2015-02-11 MED ORDER — POTASSIUM CHLORIDE CRYS ER 20 MEQ PO TBCR
40.0000 meq | EXTENDED_RELEASE_TABLET | Freq: Once | ORAL | Status: AC
  Administered 2015-02-11: 40 meq via ORAL
  Filled 2015-02-11: qty 2

## 2015-02-11 NOTE — Progress Notes (Signed)
[facility] PROGRESS NOTE  Evert Wenrich WVP:[id number] DOB: Feb 18, 1946 DOA: 02/07/2015 PCP: [name], MD  Assessment/Plan: Principal Problem:   Syncope - Currently resolved, no seizure-like activity reported. -EEG completed and reporting normal electro encephalogram - Most likely due to decreased intravascular volume as patient has been having orthostatic hypotension - We'll obtain PT evaluation prior to discharge - Administer IVF's and add ted hose  Active Problems:   Hypokalemia - We'll replace magnesium levels then provider will replacement reassess next a.m.    Bronchogenic cancer of right lung - Patient to follow-up with oncologist after discharge    Hypomagnesemia - Decreased on repeat as such will provide magnesium replacement  RML lung mass with overlying right sided airspace opacification - transitioned to levaquin and tolerating -When ready for discharge if patient has not completed full course of antibiotics will plan on providing prescription  Code Status: full Family Communication: no family at bedside  Disposition Plan: Requires PT evaluation prior to discharge   Consultants:  None  Procedures:  EEG  Antibiotics:  Levaquin  HPI/Subjective: Pt has no new complaints.  No acute issues reported overnight.  Objective: Filed Vitals:   02/11/15 1518  BP: 122/65  Pulse: 104  Temp: 98.6 F (37 C)  Resp: 20    Intake/Output Summary (Last 24 hours) at 02/11/15 1642 Last data filed at 02/11/15 1600  Gross per 24 hour  Intake 2557.08 ml  Output   2150 ml  Net 407.08 ml   Filed Weights   02/08/15 0015  Weight: 59.2 kg (130 lb 8.2 oz)    Exam:   General:  Pt in nad, alert and awake  Cardiovascular: rrr, no mrg  Respiratory: no increased wob, equal chest rise, no wheezes  Abdomen: soft, NT, ND  Musculoskeletal: No cyanosis or clubbing  Data Reviewed: Basic Metabolic Panel:  Recent Labs Lab 02/07/15 2237 02/08/15 0222  02/08/15 0630 02/09/15 0434 02/11/15 0515  NA 142  --  137  --  139  K 2.5*  --  3.3*  --  3.1*  CL 101  --  105  --  105  CO2 29  --  25  --  25  GLUCOSE 126*  --  109*  --  102*  BUN 7  --  <5*  --  <5*  CREATININE 0.57*  --  0.53*  --  0.60*  CALCIUM 9.4  --  8.9  --  8.9  MG  --  1.3*  --  1.4* 1.2*   Liver Function Tests:  Recent Labs Lab 02/07/15 2237  AST 17  ALT 8*  ALKPHOS 61  BILITOT 0.4  PROT 6.5  ALBUMIN 3.0*   No results for input(s): LIPASE, AMYLASE in the last 168 hours. No results for input(s): AMMONIA in the last 168 hours. CBC:  Recent Labs Lab 02/07/15 2237 02/08/15 0630  WBC 7.0 5.9  NEUTROABS 4.9 4.0  HGB 11.1* 9.5*  HCT 32.6* 27.1*  MCV 81.5 81.4  PLT 268 263   Cardiac Enzymes: No results for input(s): CKTOTAL, CKMB, CKMBINDEX, TROPONINI in the last 168 hours. BNP (last 3 results) No results for input(s): BNP in the last 8760 hours.  ProBNP (last 3 results) No results for input(s): PROBNP in the last 8760 hours.  CBG: No results for input(s): GLUCAP in the last 168 hours.  No results found for this or any previous visit (from the past 240 hour(s)).   Studies: No results found.  Scheduled Meds: . enoxaparin (LOVENOX) injection  40 mg Subcutaneous Q24H  . feeding supplement (ENSURE ENLIVE)  237 mL Oral BID BM  . feeding supplement (RESOURCE BREEZE)  1 Container Oral BID BM  . folic acid  1 mg Oral Daily  . levofloxacin  750 mg Oral q1800  . multivitamin with minerals  1 tablet Oral Daily  . thiamine  100 mg Oral Daily   Continuous Infusions: . sodium chloride 125 mL/hr at 02/11/15 1001    Time spent: > 35 minutes   Velvet Bathe  [facility] Pager [phone] If 7PM-7AM, please contact night-coverage at [email or web address], password [facility] 02/11/2015, 4:42 PM  LOS: 4 days

## 2015-02-12 LAB — CBC
HCT: 27.3 % — ABNORMAL LOW (ref 39.0–52.0)
Hemoglobin: 9.4 g/dL — ABNORMAL LOW (ref 13.0–17.0)
MCH: 27.9 pg (ref 26.0–34.0)
MCHC: 34.4 g/dL (ref 30.0–36.0)
MCV: 81 fL (ref 78.0–100.0)
Platelets: 273 10*3/uL (ref 150–400)
RBC: 3.37 MIL/uL — ABNORMAL LOW (ref 4.22–5.81)
RDW: 16.8 % — ABNORMAL HIGH (ref 11.5–15.5)
WBC: 5.1 10*3/uL (ref 4.0–10.5)

## 2015-02-12 LAB — POTASSIUM: Potassium: 3.2 mmol/L — ABNORMAL LOW (ref 3.5–5.1)

## 2015-02-12 LAB — PROCALCITONIN: Procalcitonin: 22.76 ng/mL

## 2015-02-12 LAB — MAGNESIUM: Magnesium: 1.5 mg/dL — ABNORMAL LOW (ref 1.7–2.4)

## 2015-02-12 MED ORDER — MAGNESIUM SULFATE IN D5W 10-5 MG/ML-% IV SOLN
1.0000 g | Freq: Once | INTRAVENOUS | Status: AC
  Administered 2015-02-12: 1 g via INTRAVENOUS
  Filled 2015-02-12: qty 100

## 2015-02-12 MED ORDER — POTASSIUM CHLORIDE CRYS ER 20 MEQ PO TBCR
40.0000 meq | EXTENDED_RELEASE_TABLET | Freq: Once | ORAL | Status: AC
  Administered 2015-02-12: 40 meq via ORAL
  Filled 2015-02-12: qty 2

## 2015-02-12 NOTE — Discharge Summary (Signed)
Physician Discharge Summary  Brendan Hooper WCB:[id number] DOB: 25-Aug-1945 DOA: 02/07/2015  PCP: [name], MD  Admit date: 02/07/2015 Discharge date: 02/12/2015  Time spent: > 35 minutes  Recommendations for Outpatient Follow-up:  1. Replace magnesium and potassium levels  Discharge Diagnoses:  Principal Problem:   Syncope Active Problems:   Hypokalemia   Bronchogenic cancer of right lung   Neck pain   Hypomagnesemia   Discharge Condition: stable  Diet recommendation: regular diet  Filed Weights   02/08/15 0015  Weight: 59.2 kg (130 lb 8.2 oz)    History of present illness:  From original HPI: 69 y.o. male with history of non-small cell lung cancer on chemotherapy, last chemotherapy last month presents to the ER after patient had brief episode of loss of consciousness  Hospital Course:  Syncope - most likely due to decreased intravascular volume - resolved with IVF rehydration  Hypomagnesemia - replaced with IV magnesium (will replace prior to discharge) patient to f/u with pcp to continue to monitor magnesium levels - patient will be d/c'd on multivitamin  Hypokalemia - replaced orally with improvement in levels and near normal limits  Procedures:  None  Consultations:  None  Discharge Exam: Filed Vitals:   02/12/15 0609  BP: 123/72  Pulse: 108  Temp: 98 F (36.7 C)  Resp: 20    General: Pt in nad, alert and awake Cardiovascular: rrr, no mrg Respiratory: cta bl, no wheezes  Discharge Instructions   Discharge Instructions    Call MD for:  difficulty breathing, headache or visual disturbances    Complete by:  As directed      Call MD for:  persistant dizziness or light-headedness    Complete by:  As directed      Call MD for:  severe uncontrolled pain    Complete by:  As directed      Call MD for:  temperature >100.4    Complete by:  As directed      Diet - low sodium heart healthy    Complete by:  As directed      Discharge  instructions    Complete by:  As directed   Please follow up with your primary care physician in 1-2 weeks or sooner should any new concerns arise.     Increase activity slowly    Complete by:  As directed           Current Discharge Medication List    CONTINUE these medications which have NOT CHANGED   Details  dexamethasone (DECADRON) 4 MG tablet 4 mg by mouth twice a day the day before, day of and day after the chemotherapy every 3 weeks Qty: 40 tablet, Refills: 1    fluconazole (DIFLUCAN) 100 MG tablet Take 1 tablet (100 mg total) by mouth daily. Qty: 14 tablet, Refills: 0    folic acid (FOLVITE) 1 MG tablet Take 1 tablet (1 mg total) by mouth daily. Qty: 30 tablet, Refills: 0    lidocaine-prilocaine (EMLA) cream Apply 1 application topically as needed. Qty: 30 g, Refills: 1    Multiple Vitamin (MULTIVITAMIN WITH MINERALS) TABS tablet Take 1 tablet by mouth daily. Qty: 30 tablet, Refills: 0    nicotine (NICODERM CQ - DOSED IN MG/24 HOURS) 21 mg/24hr patch Place 1 patch (21 mg total) onto the skin daily. Qty: 28 patch, Refills: 0   Associated Diagnoses: Bronchogenic cancer of right lung; Brain metastases; Current smoker; Lung mass    oxyCODONE (OXY IR/ROXICODONE) 5 MG immediate release tablet  Take 1-2 tablets (5-10 mg total) by mouth every 4 (four) hours as needed for moderate pain. Qty: 45 tablet, Refills: 0   Associated Diagnoses: Bronchogenic cancer of right lung; Brain metastases    potassium chloride SA (K-DUR,KLOR-CON) 20 MEQ tablet Take 1 tablet (20 mEq total) by mouth 2 (two) times daily. Qty: 14 tablet, Refills: 0   Associated Diagnoses: Hypokalemia    prochlorperazine (COMPAZINE) 10 MG tablet Take 1 tablet (10 mg total) by mouth every 6 (six) hours as needed for nausea or vomiting. Qty: 60 tablet, Refills: 1   Associated Diagnoses: Bronchogenic cancer of right lung; Brain metastases; Current smoker; Lung mass    thiamine 100 MG tablet Take 1 tablet (100 mg  total) by mouth daily. Qty: 30 tablet, Refills: 0       No Known Allergies    The results of significant diagnostics from this hospitalization (including imaging, microbiology, ancillary and laboratory) are listed below for reference.    Significant Diagnostic Studies: Dg Chest 2 View  02/07/2015   CLINICAL DATA:  Acute onset of dizziness and syncope. Status post fall. Initial encounter.  EXAM: CHEST  2 VIEW  COMPARISON:  CT of the chest performed 01/27/2015  FINDINGS: The patient's right middle lobe lung mass is again noted, with overlying peripheral right-sided airspace opacification. No pleural effusion or pneumothorax identified.  The heart remains normal in size. A chest port is noted at the left chest wall, ending at the mid SVC. No acute osseous abnormalities are identified.  IMPRESSION: Right middle lobe lung mass again noted, with overlying right-sided airspace opacification. No displaced rib fracture seen.   Electronically Signed   By: Garald Balding M.D.   On: 02/07/2015 22:28   Dg Abd 1 View  01/14/2015   CLINICAL DATA:  Acute onset of mid abdominal pain and nausea. Initial encounter.  EXAM: ABDOMEN - 1 VIEW  COMPARISON:  CT of the chest, abdomen and pelvis from 12/01/2014  FINDINGS: The visualized bowel gas pattern is unremarkable. Scattered air and stool filled loops of colon are seen; no abnormal dilatation of small bowel loops is seen to suggest small bowel obstruction. No free intra-abdominal air is identified, though evaluation for free air is limited on a single supine view.  The visualized osseous structures are within normal limits; the sacroiliac joints are unremarkable in appearance.  IMPRESSION: Unremarkable bowel gas pattern; no free intra-abdominal air seen. Small amount of stool noted in the colon.   Electronically Signed   By: Garald Balding M.D.   On: 01/14/2015 21:05   Ct Head Wo Contrast  02/08/2015   CLINICAL DATA:  Acute onset of syncope. Hypotension. Dizziness.  Current history of lung metastases to the brain. Initial encounter.  EXAM: CT HEAD WITHOUT CONTRAST  TECHNIQUE: Contiguous axial images were obtained from the base of the skull through the vertex without intravenous contrast.  COMPARISON:  PET/CT performed 09/02/2014, and MRI of the brain performed 09/13/2014  FINDINGS: The patient's previously noted brain metastases are not well characterized on CT. Mild prominence of the ventricles and sulci suggest mild cortical volume loss. Mild cerebellar atrophy is noted. Scattered periventricular white matter change likely reflects small vessel ischemic microangiopathy. No mass effect or midline shift is seen.  There is no evidence of fracture; visualized osseous structures are unremarkable in appearance. The visualized portions of the orbits are within normal limits. The paranasal sinuses and mastoid air cells are well-aerated. No significant soft tissue abnormalities are seen.  IMPRESSION: 1. Previously noted  brain metastases are not well characterized on CT. 2. Mild cortical volume loss and mild scattered small vessel ischemic microangiopathy noted.   Electronically Signed   By: Garald Balding M.D.   On: 02/08/2015 00:32   Ct Chest W Contrast  01/27/2015   CLINICAL DATA:  69 year old male with history of right-sided lung cancer diagnosed in December 2015. Status post radiation therapy to the lung complete in February 2016. Chemotherapy in progress. Brain metastasis. 20-30 pound weight loss in the past 3-4 weeks. Decreased appetite. Upper abdominal and umbilical pain, nausea, constipation and dizziness.  EXAM: CT CHEST, ABDOMEN, AND PELVIS WITH CONTRAST  TECHNIQUE: Multidetector CT imaging of the chest, abdomen and pelvis was performed following the standard protocol during bolus administration of intravenous contrast.  CONTRAST:  151m OMNIPAQUE IOHEXOL 300 MG/ML  SOLN  COMPARISON:  CT of the chest, abdomen and pelvis 12/01/2014.  FINDINGS: CT CHEST FINDINGS   Mediastinum/Lymph Nodes: Heart size is normal. There is no significant pericardial fluid, thickening or pericardial calcification. There is atherosclerosis of the thoracic aorta, the great vessels of the mediastinum and the coronary arteries, including calcified atherosclerotic plaque in the left anterior descending, left circumflex and right coronary arteries. Subcarinal lymphadenopathy has slightly improved compared to the prior study, currently measuring 18 mm in short axis (previously 24 mm in short axis). Right hilar lymphadenopathy appears very similar to the prior study, with the largest right infrahilar node measuring 13 mm in short axis. Esophagus is unremarkable in appearance. Left-sided subclavian single-lumen porta cath with tip terminating in the distal superior vena cava. No axillary lymphadenopathy.  Lungs/Pleura: Slight decreased size of perihilar right middle lobe mass, which currently measures 7.2 x 5.7 cm (image 32 of series 2). There continues to be overlying pleural retraction anteriorly, and extension to the right hilum. The lesion appears to traverse the minor fissure superiorly, extending slightly into the inferior aspect of the right upper lobe, best appreciated on sagittal image 31 of series 603. In addition, in the periphery of the right upper lobe there is an enlarging pleural based mass-like area measuring up to 5.4 x 2.1 cm (image 29 of series 6). Mild diffuse bronchial wall thickening. Mild centrilobular and paraseptal emphysema. A few scattered tiny sub cm nodules are also noted, similar to the prior examination. No pleural effusions.  Musculoskeletal/Soft Tissues: New lytic lesion in the left side of the T1 vertebral body measuring 1.7 x 1.3 cm (image 5 of series 2).  CT ABDOMEN AND PELVIS FINDINGS  Hepatobiliary: Previously noted lesion in the right lobe of the liver is similar in size to the prior examination, measuring 3.6 x 3.0 cm on today's study. Other sub cm well-defined  low-attenuation lesions are noted throughout the liver, which are unchanged in size, number and pattern of distribution, presumably tiny cysts. No intra or extrahepatic biliary ductal dilatation. Gallbladder is normal in appearance.  Pancreas: No pancreatic mass. No pancreatic ductal dilatation. No pancreatic or peripancreatic fluid or inflammatory changes.  Spleen: Unremarkable.  Adrenals/Urinary Tract: Marked enlargement of a right adrenal mass, which currently measures 3.9 x 2.7 x 7.5 cm, which demonstrates potential direct invasion into the inferior vena cava, best appreciated on image 60 of series 2. Mild left adrenal nodularity is also slightly increased compared to the prior examination. Well-defined low-attenuation lesions in the kidneys bilaterally are stable compared to prior studies, compatible with simple cysts, the largest of which is exophytic extending off the upper pole of the right kidney measuring up to 4.1 cm  in diameter. No hydroureteronephrosis. Urinary bladder is nearly completely decompressed, but otherwise unremarkable in appearance.  Stomach/Bowel: The appearance of the stomach is normal. No pathologic dilatation of small bowel or colon.  Vascular/Lymphatic: Atherosclerosis throughout the abdominal and pelvic vasculature, without evidence of aneurysm or dissection. Similar appearance of a centrally necrotic right inguinal lymph node, which currently measures 2.6 cm (previously 2.5 cm). No other lymphadenopathy noted in the abdomen or pelvis.  Reproductive: Prostate gland and seminal vesicles are unremarkable in appearance.  Other: No significant volume of ascites.  No pneumoperitoneum.  Musculoskeletal: Worsening osseous metastasis, most notable for an enlarging lesion in the left side of the L1 vertebral body, which currently measures 2.9 x 2.8 cm and has a small amount of overlying soft tissue component laterally, and is associated with a pathologic fracture of the superior endplate. There  is also an enlarging mixed lytic and sclerotic lesion in the inferior aspect of the L4 vertebral body, and an enlarging lesion in the right side of the L5 vertebral body which currently measures up to 2.2 cm in diameter.  IMPRESSION: 1. Although there has been slight regression of the large perihilar right middle lobe mass, similar to slightly improved mediastinal and right hilar lymphadenopathy, and the hepatic metastasis is essentially stable in size compared to the prior examination, there is otherwise general progression of metastatic disease, with enlargement of a pleural based mass in the right upper lobe, enlargement of bilateral adrenal metastases (including a very large right adrenal mass which is showing early signs of invasion into the inferior vena cava), as well as progressive osseous metastasis, as detailed above. 2. Additional incidental findings, as above.   Electronically Signed   By: Vinnie Langton M.D.   On: 01/27/2015 11:24   Ct Cervical Spine Wo Contrast  02/08/2015   ADDENDUM REPORT: 02/08/2015 09:26  ADDENDUM: In the body of the report the sentence should read there are now noted lytic lesions identified in the spinous process of C2 as well as within the vertebral body eccentric to the left at T1.   Electronically Signed   By: Inez Catalina M.D.   On: 02/08/2015 09:26   02/08/2015   CLINICAL DATA:  Bilateral neck pain, history of non small cell lung carcinoma with known metastatic lesions to the brain  EXAM: CT CERVICAL SPINE WITHOUT CONTRAST  TECHNIQUE: Multidetector CT imaging of the cervical spine was performed without intravenous contrast. Multiplanar CT image reconstructions were also generated.  COMPARISON:  09/02/2014  FINDINGS: Seven cervical segments are well visualized. Vertebral body height is well maintained. There are now noted lytic lesions identified in the spinous process of C2 as well as within the body E centric to the left at T1. These were not present on the prior  PET-CT examination in January of 2016 and are consistent with progressive bony metastatic lesions. No definitive involvement of the spinal canal is noted although this examination is limited with regards to evaluation of the spinal canal soft tissues.  Degenerative changes are noted at C5-6 and C6-7. No acute fracture or acute facet abnormality is identified. Surrounding soft tissues are within normal limits. The visualized lung apices demonstrate a small pleural based nodule in the right upper lobe posteriorly which measures 7-8 mm. No other significant soft tissue abnormality is noted.  IMPRESSION: Progressive bony metastatic disease at C2 posteriorly and T1 within the vertebral body.  Degenerative changes without acute abnormality.  Small right upper lobe nodule  These results will be called to  the ordering clinician or representative by the Radiologist Assistant, and communication documented in the PACS or zVision Dashboard.  Electronically Signed: By: Inez Catalina M.D. On: 02/08/2015 08:27   Ct Abdomen Pelvis W Contrast  01/27/2015   CLINICAL DATA:  70 year old male with history of right-sided lung cancer diagnosed in December 2015. Status post radiation therapy to the lung complete in February 2016. Chemotherapy in progress. Brain metastasis. 20-30 pound weight loss in the past 3-4 weeks. Decreased appetite. Upper abdominal and umbilical pain, nausea, constipation and dizziness.  EXAM: CT CHEST, ABDOMEN, AND PELVIS WITH CONTRAST  TECHNIQUE: Multidetector CT imaging of the chest, abdomen and pelvis was performed following the standard protocol during bolus administration of intravenous contrast.  CONTRAST:  123m OMNIPAQUE IOHEXOL 300 MG/ML  SOLN  COMPARISON:  CT of the chest, abdomen and pelvis 12/01/2014.  FINDINGS: CT CHEST FINDINGS  Mediastinum/Lymph Nodes: Heart size is normal. There is no significant pericardial fluid, thickening or pericardial calcification. There is atherosclerosis of the thoracic  aorta, the great vessels of the mediastinum and the coronary arteries, including calcified atherosclerotic plaque in the left anterior descending, left circumflex and right coronary arteries. Subcarinal lymphadenopathy has slightly improved compared to the prior study, currently measuring 18 mm in short axis (previously 24 mm in short axis). Right hilar lymphadenopathy appears very similar to the prior study, with the largest right infrahilar node measuring 13 mm in short axis. Esophagus is unremarkable in appearance. Left-sided subclavian single-lumen porta cath with tip terminating in the distal superior vena cava. No axillary lymphadenopathy.  Lungs/Pleura: Slight decreased size of perihilar right middle lobe mass, which currently measures 7.2 x 5.7 cm (image 32 of series 2). There continues to be overlying pleural retraction anteriorly, and extension to the right hilum. The lesion appears to traverse the minor fissure superiorly, extending slightly into the inferior aspect of the right upper lobe, best appreciated on sagittal image 31 of series 603. In addition, in the periphery of the right upper lobe there is an enlarging pleural based mass-like area measuring up to 5.4 x 2.1 cm (image 29 of series 6). Mild diffuse bronchial wall thickening. Mild centrilobular and paraseptal emphysema. A few scattered tiny sub cm nodules are also noted, similar to the prior examination. No pleural effusions.  Musculoskeletal/Soft Tissues: New lytic lesion in the left side of the T1 vertebral body measuring 1.7 x 1.3 cm (image 5 of series 2).  CT ABDOMEN AND PELVIS FINDINGS  Hepatobiliary: Previously noted lesion in the right lobe of the liver is similar in size to the prior examination, measuring 3.6 x 3.0 cm on today's study. Other sub cm well-defined low-attenuation lesions are noted throughout the liver, which are unchanged in size, number and pattern of distribution, presumably tiny cysts. No intra or extrahepatic biliary  ductal dilatation. Gallbladder is normal in appearance.  Pancreas: No pancreatic mass. No pancreatic ductal dilatation. No pancreatic or peripancreatic fluid or inflammatory changes.  Spleen: Unremarkable.  Adrenals/Urinary Tract: Marked enlargement of a right adrenal mass, which currently measures 3.9 x 2.7 x 7.5 cm, which demonstrates potential direct invasion into the inferior vena cava, best appreciated on image 60 of series 2. Mild left adrenal nodularity is also slightly increased compared to the prior examination. Well-defined low-attenuation lesions in the kidneys bilaterally are stable compared to prior studies, compatible with simple cysts, the largest of which is exophytic extending off the upper pole of the right kidney measuring up to 4.1 cm in diameter. No hydroureteronephrosis. Urinary bladder is  nearly completely decompressed, but otherwise unremarkable in appearance.  Stomach/Bowel: The appearance of the stomach is normal. No pathologic dilatation of small bowel or colon.  Vascular/Lymphatic: Atherosclerosis throughout the abdominal and pelvic vasculature, without evidence of aneurysm or dissection. Similar appearance of a centrally necrotic right inguinal lymph node, which currently measures 2.6 cm (previously 2.5 cm). No other lymphadenopathy noted in the abdomen or pelvis.  Reproductive: Prostate gland and seminal vesicles are unremarkable in appearance.  Other: No significant volume of ascites.  No pneumoperitoneum.  Musculoskeletal: Worsening osseous metastasis, most notable for an enlarging lesion in the left side of the L1 vertebral body, which currently measures 2.9 x 2.8 cm and has a small amount of overlying soft tissue component laterally, and is associated with a pathologic fracture of the superior endplate. There is also an enlarging mixed lytic and sclerotic lesion in the inferior aspect of the L4 vertebral body, and an enlarging lesion in the right side of the L5 vertebral body which  currently measures up to 2.2 cm in diameter.  IMPRESSION: 1. Although there has been slight regression of the large perihilar right middle lobe mass, similar to slightly improved mediastinal and right hilar lymphadenopathy, and the hepatic metastasis is essentially stable in size compared to the prior examination, there is otherwise general progression of metastatic disease, with enlargement of a pleural based mass in the right upper lobe, enlargement of bilateral adrenal metastases (including a very large right adrenal mass which is showing early signs of invasion into the inferior vena cava), as well as progressive osseous metastasis, as detailed above. 2. Additional incidental findings, as above.   Electronically Signed   By: Vinnie Langton M.D.   On: 01/27/2015 11:24    Microbiology: No results found for this or any previous visit (from the past 240 hour(s)).   Labs: Basic Metabolic Panel:  Recent Labs Lab 02/07/15 2237 02/08/15 0222 02/08/15 0630 02/09/15 0434 02/11/15 0515 02/12/15 0455  NA 142  --  137  --  139  --   K 2.5*  --  3.3*  --  3.1* 3.2*  CL 101  --  105  --  105  --   CO2 29  --  25  --  25  --   GLUCOSE 126*  --  109*  --  102*  --   BUN 7  --  <5*  --  <5*  --   CREATININE 0.57*  --  0.53*  --  0.60*  --   CALCIUM 9.4  --  8.9  --  8.9  --   MG  --  1.3*  --  1.4* 1.2* 1.5*   Liver Function Tests:  Recent Labs Lab 02/07/15 2237  AST 17  ALT 8*  ALKPHOS 61  BILITOT 0.4  PROT 6.5  ALBUMIN 3.0*   No results for input(s): LIPASE, AMYLASE in the last 168 hours. No results for input(s): AMMONIA in the last 168 hours. CBC:  Recent Labs Lab 02/07/15 2237 02/08/15 0630 02/12/15 0455  WBC 7.0 5.9 5.1  NEUTROABS 4.9 4.0  --   HGB 11.1* 9.5* 9.4*  HCT 32.6* 27.1* 27.3*  MCV 81.5 81.4 81.0  PLT 268 263 273   Cardiac Enzymes: No results for input(s): CKTOTAL, CKMB, CKMBINDEX, TROPONINI in the last 168 hours. BNP: BNP (last 3 results) No results for  input(s): BNP in the last 8760 hours.  ProBNP (last 3 results) No results for input(s): PROBNP in the last 8760 hours.  CBG: No results for input(s): GLUCAP in the last 168 hours.     Signed:  Velvet Bathe  [facility] 02/12/2015, 10:43 AM

## 2015-02-12 NOTE — Progress Notes (Signed)
ANTIBIOTIC CONSULT NOTE - FOLLOW UP  Pharmacy Consult for Levaquin Indication: Pneumonia  No Known Allergies  Patient Measurements: Height: 6' (182.9 cm) Weight: 130 lb 8.2 oz (59.2 kg) IBW/kg (Calculated) : 77.6  Vital Signs: Temp: 98 F (36.7 C) (06/26 0609) Temp Source: Oral (06/26 0609) BP: 123/72 mmHg (06/26 0609) Pulse Rate: 108 (06/26 0609) Intake/Output from previous day: 06/25 0701 - 06/26 0700 In: 3645 [P.O.:720; I.V.:2875; IV Piggyback:50] Out: 7703 [Urine:3175] Intake/Output from this shift: Total I/O In: -  Out: 100 [Urine:100]  Labs:  Recent Labs  02/11/15 0515 02/12/15 0455  WBC  --  5.1  HGB  --  9.4*  PLT  --  273  CREATININE 0.60*  --    Estimated Creatinine Clearance: 73 mL/min (by C-G formula based on Cr of 0.6). No results for input(s): VANCOTROUGH, VANCOPEAK, VANCORANDOM, GENTTROUGH, GENTPEAK, GENTRANDOM, TOBRATROUGH, TOBRAPEAK, TOBRARND, AMIKACINPEAK, AMIKACINTROU, AMIKACIN in the last 72 hours.   Assessment: 69 yo male with hx NSCLC s/p chemotherapy who presents with syncope. Pharmacy was consulted 6/22 to dose vancomycin and zosyn since CXR showed postobstructive airspace disease, but on 6/23, we are now consulted to change to levaquin.  6/22 >> zosyn  >> 6/23 6/22 >> vancomycin  >> 6/23 6/23 >> levaquin >>  Today, 02/12/2015: Day # 5 antibiotics, day #4 Levaquin Tmax: afebrile WBC: wnl Renal: SCr stable, CrCl 73 ml/min Procalcitonin: 18.74 > 23.68 > 22.76 No cultures obtained  Goal of Therapy:  Eradication of infection Levaquin dose appropriate for indication and renal function  Plan:   Levaquin '750mg'$  PO q24h  Continue to monitor renal function, QTc interval, clinical course   Gretta Arab PharmD, BCPS Pager [phone] 02/12/2015 10:44 AM

## 2015-02-12 NOTE — Progress Notes (Signed)
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 Pt lives in a motel.     PT Comments    Progressing with mobility. Pt continues to deny lightheadedness/dizziness. Able to walk ~250 feet this session. Unsteady at times. Pt c/o 8/10 bil LE pain on today. Encouraged pt to use RW/cane if needed. Pt states he has both at home already. Continue to recommend home safety evaluation.   Follow Up Recommendations   (home safety evaluation)     Equipment Recommendations  None recommended by PT    Recommendations for Other Services       Precautions / Restrictions Precautions Precautions: Fall Restrictions Weight Bearing Restrictions: No    Mobility  Bed Mobility Overal bed mobility: Modified Independent                Transfers Overall transfer level: Modified independent               General transfer comment: Increased time.   Ambulation/Gait Ambulation/Gait assistance: Min guard;Supervision Ambulation Distance (Feet): 250 Feet Assistive device: None Gait Pattern/deviations: Staggering right;Staggering left;Drifts right/left;Wide base of support     General Gait Details: slow gait speed. Pt denied lightheadedness/dizziness.    Stairs            Wheelchair Mobility    Modified Rankin (Stroke Patients Only)       Balance           Standing balance support: No upper extremity supported;During functional activity Standing balance-Leahy Scale: Good                      Cognition Arousal/Alertness: Awake/alert Behavior During Therapy: WFL for tasks assessed/performed Overall Cognitive Status: Within Functional Limits for tasks assessed                      Exercises      General Comments        Pertinent Vitals/Pain Pain  Assessment: 0-10 Pain Location: bil LEs Pain Descriptors / Indicators: Aching;Sore Pain Intervention(s): Repositioned    Home Living                      Prior Function            PT Goals (current goals can now be found in the care plan section) Progress towards PT goals: Progressing toward goals    Frequency  Min 3X/week    PT Plan Current plan remains appropriate    Co-evaluation             End of Session Equipment Utilized During Treatment: Gait belt Activity Tolerance: Patient tolerated treatment well Patient left: in bed;with call bell/phone within reach;with bed alarm set     Time: 4270-6237 PT Time Calculation (min) (ACUTE ONLY): 13 min  Charges:  $Gait Training: 8-22 mins                    G Codes:      Weston Anna, MPT Pager: [phone]

## 2015-02-14 ENCOUNTER — Other Ambulatory Visit: Payer: Self-pay | Admitting: Physician Assistant

## 2015-02-16 ENCOUNTER — Other Ambulatory Visit: Payer: Self-pay | Admitting: Medical Oncology

## 2015-02-16 ENCOUNTER — Telehealth: Payer: Self-pay | Admitting: Physician Assistant

## 2015-02-16 ENCOUNTER — Ambulatory Visit (HOSPITAL_BASED_OUTPATIENT_CLINIC_OR_DEPARTMENT_OTHER): Payer: Medicare Other

## 2015-02-16 ENCOUNTER — Other Ambulatory Visit: Payer: Self-pay | Admitting: *Deleted

## 2015-02-16 ENCOUNTER — Encounter: Payer: Self-pay | Admitting: Physician Assistant

## 2015-02-16 ENCOUNTER — Ambulatory Visit (HOSPITAL_BASED_OUTPATIENT_CLINIC_OR_DEPARTMENT_OTHER): Payer: Medicare Other | Admitting: Physician Assistant

## 2015-02-16 VITALS — BP 79/51 | HR 122 | Temp 98.1°F | Resp 20 | Wt 125.0 lb

## 2015-02-16 VITALS — BP 107/57 | HR 69

## 2015-02-16 DIAGNOSIS — I9589 Other hypotension: Secondary | ICD-10-CM | POA: Diagnosis not present

## 2015-02-16 DIAGNOSIS — C3491 Malignant neoplasm of unspecified part of right bronchus or lung: Secondary | ICD-10-CM

## 2015-02-16 DIAGNOSIS — C3411 Malignant neoplasm of upper lobe, right bronchus or lung: Secondary | ICD-10-CM

## 2015-02-16 DIAGNOSIS — C7951 Secondary malignant neoplasm of bone: Secondary | ICD-10-CM

## 2015-02-16 DIAGNOSIS — Z79899 Other long term (current) drug therapy: Secondary | ICD-10-CM

## 2015-02-16 DIAGNOSIS — C7931 Secondary malignant neoplasm of brain: Secondary | ICD-10-CM

## 2015-02-16 DIAGNOSIS — E876 Hypokalemia: Secondary | ICD-10-CM

## 2015-02-16 DIAGNOSIS — R000 Tachycardia, unspecified: Secondary | ICD-10-CM | POA: Diagnosis not present

## 2015-02-16 LAB — CBC WITH DIFFERENTIAL/PLATELET
BASO%: 1.3 % (ref 0.0–2.0)
Basophils Absolute: 0.1 10*3/uL (ref 0.0–0.1)
EOS%: 5.1 % (ref 0.0–7.0)
Eosinophils Absolute: 0.5 10*3/uL (ref 0.0–0.5)
HCT: 30.9 % — ABNORMAL LOW (ref 38.4–49.9)
HGB: 10.3 g/dL — ABNORMAL LOW (ref 13.0–17.1)
LYMPH%: 11.1 % — AB (ref 14.0–49.0)
MCH: 27.6 pg (ref 27.2–33.4)
MCHC: 33.4 g/dL (ref 32.0–36.0)
MCV: 82.7 fL (ref 79.3–98.0)
MONO#: 1.2 10*3/uL — AB (ref 0.1–0.9)
MONO%: 12.9 % (ref 0.0–14.0)
NEUT%: 69.6 % (ref 39.0–75.0)
NEUTROS ABS: 6.3 10*3/uL (ref 1.5–6.5)
Platelets: 397 10*3/uL (ref 140–400)
RBC: 3.73 10*6/uL — ABNORMAL LOW (ref 4.20–5.82)
RDW: 18 % — ABNORMAL HIGH (ref 11.0–14.6)
WBC: 9.1 10*3/uL (ref 4.0–10.3)
lymph#: 1 10*3/uL (ref 0.9–3.3)

## 2015-02-16 LAB — COMPREHENSIVE METABOLIC PANEL (CC13)
ALK PHOS: 71 U/L (ref 40–150)
ALT: 9 U/L (ref 0–55)
AST: 34 U/L (ref 5–34)
Albumin: 2.6 g/dL — ABNORMAL LOW (ref 3.5–5.0)
Anion Gap: 17 mEq/L — ABNORMAL HIGH (ref 3–11)
BUN: 4.9 mg/dL — AB (ref 7.0–26.0)
CO2: 22 mEq/L (ref 22–29)
CREATININE: 0.7 mg/dL (ref 0.7–1.3)
Calcium: 10.4 mg/dL (ref 8.4–10.4)
Chloride: 103 mEq/L (ref 98–109)
EGFR: >90 mL/min/{1.73_m2} (ref >90)
GLUCOSE: 104 mg/dL (ref 70–140)
Potassium: 2.9 mEq/L — CL (ref 3.5–5.1)
SODIUM: 142 meq/L (ref 136–145)
TOTAL PROTEIN: 6.9 g/dL (ref 6.4–8.3)
Total Bilirubin: 0.58 mg/dL (ref 0.20–1.20)

## 2015-02-16 MED ORDER — SODIUM CHLORIDE 0.9 % IJ SOLN
10.0000 mL | INTRAMUSCULAR | Status: AC | PRN
  Administered 2015-02-16: 10 mL via INTRAVENOUS
  Filled 2015-02-16: qty 10

## 2015-02-16 MED ORDER — HEPARIN SOD (PORK) LOCK FLUSH 100 UNIT/ML IV SOLN
500.0000 [IU] | Freq: Once | INTRAVENOUS | Status: AC
  Administered 2015-02-16: 500 [IU] via INTRAVENOUS
  Filled 2015-02-16: qty 5

## 2015-02-16 MED ORDER — SODIUM CHLORIDE 0.9 % IV SOLN
INTRAVENOUS | Status: AC
  Administered 2015-02-16: 11:00:00 via INTRAVENOUS

## 2015-02-16 MED ORDER — OXYCODONE HCL 5 MG PO TABS: 5.0000 mg | ORAL_TABLET | ORAL | Status: DC | PRN

## 2015-02-16 MED ORDER — POTASSIUM CHLORIDE CRYS ER 20 MEQ PO TBCR: 40.0000 meq | EXTENDED_RELEASE_TABLET | Freq: Once | ORAL | Status: DC

## 2015-02-16 MED ORDER — POTASSIUM CHLORIDE CRYS ER 20 MEQ PO TBCR
40.0000 meq | EXTENDED_RELEASE_TABLET | Freq: Once | ORAL | Status: AC
  Administered 2015-02-16: 40 meq via ORAL
  Filled 2015-02-16: qty 2

## 2015-02-16 NOTE — Addendum Note (Signed)
Addended by: Ardeen Garland on: 02/16/2015 03:10 PM   Modules accepted: Orders

## 2015-02-16 NOTE — Telephone Encounter (Signed)
I called pt re refills and

## 2015-02-16 NOTE — Progress Notes (Addendum)
[place] Telephone:([phone]) [phone]   Fax:([phone]) [phone]  OFFICE PROGRESS NOTE  [name], MD [place] [address]  DIAGNOSIS: stage IV (T3, N2, M1 B) non-small cell lung cancer, adenocarcinoma presented with large right lung mass in addition to mediastinal lymphadenopathy and metastatic lesion in the right thigh diagnosed in December 2015.  PRIOR THERAPY: Systemic chemotherapy with carboplatin AUC of 5 and Alimta 500 mg/m started on 09/05/2014. Status post 6 cycles.  CURRENT THERAPY: Immunotherapy with development at 3 mg/kg given every 2 weeks. First cycle expected 02/21/2015.  INTERVAL HISTORY: Brendan Hooper 69 y.o. male returns to the clinic today for a work in visit accompanied by a family member. He reports decreased by mouth intake, drinking perhaps 1 inch or nutritional supplement daily. Complaints of dizziness as well as neck pain. His by mouth intake of both food and fluids is severely decreased. He's lost an additional 5 pounds since 02/08/2015. He denied having any significant nausea or vomiting, no fever or chills.  He has no chest pain but continues to have shortness of breath with exertion with no cough or hemoptysis. The patient denied having any night sweats.   MEDICAL HISTORY: Past Medical History  Diagnosis Date  . GERD (gastroesophageal reflux disease)   . Headache(784.0)   . Sickle cell anemia   . Lung mass   . Hypertension   . 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  MEDICATIONS:  Current Outpatient Prescriptions  Medication Sig Dispense Refill  . dexamethasone (DECADRON) 4 MG tablet 4 mg by mouth twice a day the day before, day of and day after the chemotherapy every 3 weeks 40 tablet 1  . fluconazole (DIFLUCAN) 100 MG tablet Take 1 tablet (100 mg total) by mouth daily. 14 tablet 0  . folic acid (FOLVITE) 1  MG tablet Take 1 tablet (1 mg total) by mouth daily. 30 tablet 0  . lidocaine-prilocaine (EMLA) cream Apply 1 application topically as needed. 30 g 1  . Multiple Vitamin (MULTIVITAMIN WITH MINERALS) TABS tablet Take 1 tablet by mouth daily. 30 tablet 0  . nicotine (NICODERM CQ - DOSED IN MG/24 HOURS) 21 mg/24hr patch Place 1 patch (21 mg total) onto the skin daily. 28 patch 0  . oxyCODONE (OXY IR/ROXICODONE) 5 MG immediate release tablet Take 1-2 tablets (5-10 mg total) by mouth every 4 (four) hours as needed for moderate pain. 45 tablet 0  . potassium chloride SA (K-DUR,KLOR-CON) 20 MEQ tablet Take 1 tablet (20 mEq total) by mouth 2 (two) times daily. 14 tablet 0  . prochlorperazine (COMPAZINE) 10 MG tablet Take 1 tablet (10 mg total) by mouth every 6 (six) hours as needed for nausea or vomiting. 60 tablet 1  . thiamine 100 MG tablet Take 1 tablet (100 mg total) by mouth daily. 30 tablet 0   Current Facility-Administered Medications  Medication Dose Route Frequency Provider Last Rate Last Dose  . 0.9 %  sodium chloride infusion   Intravenous Continuous Carlton Adam, PA-C 750 mL/hr at 02/16/15 1030      SURGICAL HISTORY:  Past Surgical History  Procedure Laterality Date  . No past surgeries    . Portacath placement Left 09/01/2014    Procedure: INSERTION PORT-A-CATH;  Surgeon: Melrose Nakayama, MD;  Location: [facility];  Service: Thoracic;  Laterality:  Left;    REVIEW OF SYSTEMS:  Constitutional: positive for anorexia and weight loss Eyes: negative Ears, nose, mouth, throat, and face: negative Respiratory: positive for dyspnea on exertion Cardiovascular: negative Gastrointestinal: negative Genitourinary:negative Integument/breast: negative Hematologic/lymphatic: negative Musculoskeletal:negative Neurological: negative Behavioral/Psych: negative Endocrine: negative Allergic/Immunologic: negative   PHYSICAL EXAMINATION: General appearance: alert, cooperative and no  distress Head: Normocephalic, without obvious abnormality, atraumatic Neck: no adenopathy, no JVD, supple, symmetrical, trachea midline and thyroid not enlarged, symmetric, no tenderness/mass/nodules Lymph nodes: Cervical, supraclavicular, and axillary nodes normal. Resp: clear to auscultation bilaterally Back: symmetric, no curvature. ROM normal. No CVA tenderness. Cardio: regular rate and rhythm, S1, S2 normal, no murmur, click, rub or gallop GI: soft, non-tender; bowel sounds normal; no masses,  no organomegaly Extremities: extremities normal, atraumatic, no cyanosis or edema Neurologic: Alert and oriented X 3, normal strength and tone. Normal symmetric reflexes. Normal coordination and gait Mouth: positive for persistent thrush, improved but still present  ECOG PERFORMANCE STATUS: 1 - Symptomatic but completely ambulatory  Blood pressure 79/51, pulse 122, temperature 98.1 F (36.7 C), temperature source Oral, resp. rate 20, weight 125 lb (56.7 kg), SpO2 100 %.  LABORATORY DATA: Lab Results  Component Value Date   WBC 9.1 02/16/2015   HGB 10.3* 02/16/2015   HCT 30.9* 02/16/2015   MCV 82.7 02/16/2015   PLT 397 02/16/2015      Chemistry      Component Value Date/Time   NA 142 02/16/2015 1620   NA 139 02/11/2015 0515   K 2.9* 02/16/2015 1620   K 3.2* 02/12/2015 0455   CL 105 02/11/2015 0515   CO2 22 02/16/2015 1620   CO2 25 02/11/2015 0515   BUN 4.9* 02/16/2015 1620   BUN <5* 02/11/2015 0515   CREATININE 0.7 02/16/2015 1620   CREATININE 0.60* 02/11/2015 0515      Component Value Date/Time   CALCIUM 10.4 02/16/2015 1620   CALCIUM 8.9 02/11/2015 0515   ALKPHOS 71 02/16/2015 1620   ALKPHOS 61 02/07/2015 2237   AST 34 02/16/2015 1620   AST 17 02/07/2015 2237   ALT 9 02/16/2015 1620   ALT 8* 02/07/2015 2237   BILITOT 0.58 02/16/2015 1620   BILITOT 0.4 02/07/2015 2237       RADIOGRAPHIC STUDIES: Dg Chest 2 View  02/07/2015   CLINICAL DATA:  Acute onset of  dizziness and syncope. Status post fall. Initial encounter.  EXAM: CHEST  2 VIEW  COMPARISON:  CT of the chest performed 01/27/2015  FINDINGS: The patient's right middle lobe lung mass is again noted, with overlying peripheral right-sided airspace opacification. No pleural effusion or pneumothorax identified.  The heart remains normal in size. A chest port is noted at the left chest wall, ending at the mid SVC. No acute osseous abnormalities are identified.  IMPRESSION: Right middle lobe lung mass again noted, with overlying right-sided airspace opacification. No displaced rib fracture seen.   Electronically Signed   By: Garald Balding M.D.   On: 02/07/2015 22:28   Ct Head Wo Contrast  02/08/2015   CLINICAL DATA:  Acute onset of syncope. Hypotension. Dizziness. Current history of lung metastases to the brain. Initial encounter.  EXAM: CT HEAD WITHOUT CONTRAST  TECHNIQUE: Contiguous axial images were obtained from the base of the skull through the vertex without intravenous contrast.  COMPARISON:  PET/CT performed 09/02/2014, and MRI of the brain performed 09/13/2014  FINDINGS: The patient's previously noted brain metastases are not well characterized on CT. Mild prominence of the ventricles and  sulci suggest mild cortical volume loss. Mild cerebellar atrophy is noted. Scattered periventricular white matter change likely reflects small vessel ischemic microangiopathy. No mass effect or midline shift is seen.  There is no evidence of fracture; visualized osseous structures are unremarkable in appearance. The visualized portions of the orbits are within normal limits. The paranasal sinuses and mastoid air cells are well-aerated. No significant soft tissue abnormalities are seen.  IMPRESSION: 1. Previously noted brain metastases are not well characterized on CT. 2. Mild cortical volume loss and mild scattered small vessel ischemic microangiopathy noted.   Electronically Signed   By: Garald Balding M.D.   On:  02/08/2015 00:32   Ct Chest W Contrast  01/27/2015   CLINICAL DATA:  69 year old male with history of right-sided lung cancer diagnosed in December 2015. Status post radiation therapy to the lung complete in February 2016. Chemotherapy in progress. Brain metastasis. 20-30 pound weight loss in the past 3-4 weeks. Decreased appetite. Upper abdominal and umbilical pain, nausea, constipation and dizziness.  EXAM: CT CHEST, ABDOMEN, AND PELVIS WITH CONTRAST  TECHNIQUE: Multidetector CT imaging of the chest, abdomen and pelvis was performed following the standard protocol during bolus administration of intravenous contrast.  CONTRAST:  116m OMNIPAQUE IOHEXOL 300 MG/ML  SOLN  COMPARISON:  CT of the chest, abdomen and pelvis 12/01/2014.  FINDINGS: CT CHEST FINDINGS  Mediastinum/Lymph Nodes: Heart size is normal. There is no significant pericardial fluid, thickening or pericardial calcification. There is atherosclerosis of the thoracic aorta, the great vessels of the mediastinum and the coronary arteries, including calcified atherosclerotic plaque in the left anterior descending, left circumflex and right coronary arteries. Subcarinal lymphadenopathy has slightly improved compared to the prior study, currently measuring 18 mm in short axis (previously 24 mm in short axis). Right hilar lymphadenopathy appears very similar to the prior study, with the largest right infrahilar node measuring 13 mm in short axis. Esophagus is unremarkable in appearance. Left-sided subclavian single-lumen porta cath with tip terminating in the distal superior vena cava. No axillary lymphadenopathy.  Lungs/Pleura: Slight decreased size of perihilar right middle lobe mass, which currently measures 7.2 x 5.7 cm (image 32 of series 2). There continues to be overlying pleural retraction anteriorly, and extension to the right hilum. The lesion appears to traverse the minor fissure superiorly, extending slightly into the inferior aspect of the right  upper lobe, best appreciated on sagittal image 31 of series 603. In addition, in the periphery of the right upper lobe there is an enlarging pleural based mass-like area measuring up to 5.4 x 2.1 cm (image 29 of series 6). Mild diffuse bronchial wall thickening. Mild centrilobular and paraseptal emphysema. A few scattered tiny sub cm nodules are also noted, similar to the prior examination. No pleural effusions.  Musculoskeletal/Soft Tissues: New lytic lesion in the left side of the T1 vertebral body measuring 1.7 x 1.3 cm (image 5 of series 2).  CT ABDOMEN AND PELVIS FINDINGS  Hepatobiliary: Previously noted lesion in the right lobe of the liver is similar in size to the prior examination, measuring 3.6 x 3.0 cm on today's study. Other sub cm well-defined low-attenuation lesions are noted throughout the liver, which are unchanged in size, number and pattern of distribution, presumably tiny cysts. No intra or extrahepatic biliary ductal dilatation. Gallbladder is normal in appearance.  Pancreas: No pancreatic mass. No pancreatic ductal dilatation. No pancreatic or peripancreatic fluid or inflammatory changes.  Spleen: Unremarkable.  Adrenals/Urinary Tract: Marked enlargement of a right adrenal mass, which currently  measures 3.9 x 2.7 x 7.5 cm, which demonstrates potential direct invasion into the inferior vena cava, best appreciated on image 60 of series 2. Mild left adrenal nodularity is also slightly increased compared to the prior examination. Well-defined low-attenuation lesions in the kidneys bilaterally are stable compared to prior studies, compatible with simple cysts, the largest of which is exophytic extending off the upper pole of the right kidney measuring up to 4.1 cm in diameter. No hydroureteronephrosis. Urinary bladder is nearly completely decompressed, but otherwise unremarkable in appearance.  Stomach/Bowel: The appearance of the stomach is normal. No pathologic dilatation of small bowel or colon.   Vascular/Lymphatic: Atherosclerosis throughout the abdominal and pelvic vasculature, without evidence of aneurysm or dissection. Similar appearance of a centrally necrotic right inguinal lymph node, which currently measures 2.6 cm (previously 2.5 cm). No other lymphadenopathy noted in the abdomen or pelvis.  Reproductive: Prostate gland and seminal vesicles are unremarkable in appearance.  Other: No significant volume of ascites.  No pneumoperitoneum.  Musculoskeletal: Worsening osseous metastasis, most notable for an enlarging lesion in the left side of the L1 vertebral body, which currently measures 2.9 x 2.8 cm and has a small amount of overlying soft tissue component laterally, and is associated with a pathologic fracture of the superior endplate. There is also an enlarging mixed lytic and sclerotic lesion in the inferior aspect of the L4 vertebral body, and an enlarging lesion in the right side of the L5 vertebral body which currently measures up to 2.2 cm in diameter.  IMPRESSION: 1. Although there has been slight regression of the large perihilar right middle lobe mass, similar to slightly improved mediastinal and right hilar lymphadenopathy, and the hepatic metastasis is essentially stable in size compared to the prior examination, there is otherwise general progression of metastatic disease, with enlargement of a pleural based mass in the right upper lobe, enlargement of bilateral adrenal metastases (including a very large right adrenal mass which is showing early signs of invasion into the inferior vena cava), as well as progressive osseous metastasis, as detailed above. 2. Additional incidental findings, as above.   Electronically Signed   By: Vinnie Langton M.D.   On: 01/27/2015 11:24   Ct Cervical Spine Wo Contrast  02/08/2015   ADDENDUM REPORT: 02/08/2015 09:26  ADDENDUM: In the body of the report the sentence should read there are now noted lytic lesions identified in the spinous process of C2 as  well as within the vertebral body eccentric to the left at T1.   Electronically Signed   By: Inez Catalina M.D.   On: 02/08/2015 09:26   02/08/2015   CLINICAL DATA:  Bilateral neck pain, history of non small cell lung carcinoma with known metastatic lesions to the brain  EXAM: CT CERVICAL SPINE WITHOUT CONTRAST  TECHNIQUE: Multidetector CT imaging of the cervical spine was performed without intravenous contrast. Multiplanar CT image reconstructions were also generated.  COMPARISON:  09/02/2014  FINDINGS: Seven cervical segments are well visualized. Vertebral body height is well maintained. There are now noted lytic lesions identified in the spinous process of C2 as well as within the body E centric to the left at T1. These were not present on the prior PET-CT examination in January of 2016 and are consistent with progressive bony metastatic lesions. No definitive involvement of the spinal canal is noted although this examination is limited with regards to evaluation of the spinal canal soft tissues.  Degenerative changes are noted at C5-6 and C6-7. No acute fracture  or acute facet abnormality is identified. Surrounding soft tissues are within normal limits. The visualized lung apices demonstrate a small pleural based nodule in the right upper lobe posteriorly which measures 7-8 mm. No other significant soft tissue abnormality is noted.  IMPRESSION: Progressive bony metastatic disease at C2 posteriorly and T1 within the vertebral body.  Degenerative changes without acute abnormality.  Small right upper lobe nodule  These results will be called to the ordering clinician or representative by the Radiologist Assistant, and communication documented in the PACS or zVision Dashboard.  Electronically Signed: By: Inez Catalina M.D. On: 02/08/2015 08:27   Ct Abdomen Pelvis W Contrast  01/27/2015   CLINICAL DATA:  69 year old male with history of right-sided lung cancer diagnosed in December 2015. Status post radiation  therapy to the lung complete in February 2016. Chemotherapy in progress. Brain metastasis. 20-30 pound weight loss in the past 3-4 weeks. Decreased appetite. Upper abdominal and umbilical pain, nausea, constipation and dizziness.  EXAM: CT CHEST, ABDOMEN, AND PELVIS WITH CONTRAST  TECHNIQUE: Multidetector CT imaging of the chest, abdomen and pelvis was performed following the standard protocol during bolus administration of intravenous contrast.  CONTRAST:  125m OMNIPAQUE IOHEXOL 300 MG/ML  SOLN  COMPARISON:  CT of the chest, abdomen and pelvis 12/01/2014.  FINDINGS: CT CHEST FINDINGS  Mediastinum/Lymph Nodes: Heart size is normal. There is no significant pericardial fluid, thickening or pericardial calcification. There is atherosclerosis of the thoracic aorta, the great vessels of the mediastinum and the coronary arteries, including calcified atherosclerotic plaque in the left anterior descending, left circumflex and right coronary arteries. Subcarinal lymphadenopathy has slightly improved compared to the prior study, currently measuring 18 mm in short axis (previously 24 mm in short axis). Right hilar lymphadenopathy appears very similar to the prior study, with the largest right infrahilar node measuring 13 mm in short axis. Esophagus is unremarkable in appearance. Left-sided subclavian single-lumen porta cath with tip terminating in the distal superior vena cava. No axillary lymphadenopathy.  Lungs/Pleura: Slight decreased size of perihilar right middle lobe mass, which currently measures 7.2 x 5.7 cm (image 32 of series 2). There continues to be overlying pleural retraction anteriorly, and extension to the right hilum. The lesion appears to traverse the minor fissure superiorly, extending slightly into the inferior aspect of the right upper lobe, best appreciated on sagittal image 31 of series 603. In addition, in the periphery of the right upper lobe there is an enlarging pleural based mass-like area  measuring up to 5.4 x 2.1 cm (image 29 of series 6). Mild diffuse bronchial wall thickening. Mild centrilobular and paraseptal emphysema. A few scattered tiny sub cm nodules are also noted, similar to the prior examination. No pleural effusions.  Musculoskeletal/Soft Tissues: New lytic lesion in the left side of the T1 vertebral body measuring 1.7 x 1.3 cm (image 5 of series 2).  CT ABDOMEN AND PELVIS FINDINGS  Hepatobiliary: Previously noted lesion in the right lobe of the liver is similar in size to the prior examination, measuring 3.6 x 3.0 cm on today's study. Other sub cm well-defined low-attenuation lesions are noted throughout the liver, which are unchanged in size, number and pattern of distribution, presumably tiny cysts. No intra or extrahepatic biliary ductal dilatation. Gallbladder is normal in appearance.  Pancreas: No pancreatic mass. No pancreatic ductal dilatation. No pancreatic or peripancreatic fluid or inflammatory changes.  Spleen: Unremarkable.  Adrenals/Urinary Tract: Marked enlargement of a right adrenal mass, which currently measures 3.9 x 2.7 x 7.5 cm,  which demonstrates potential direct invasion into the inferior vena cava, best appreciated on image 60 of series 2. Mild left adrenal nodularity is also slightly increased compared to the prior examination. Well-defined low-attenuation lesions in the kidneys bilaterally are stable compared to prior studies, compatible with simple cysts, the largest of which is exophytic extending off the upper pole of the right kidney measuring up to 4.1 cm in diameter. No hydroureteronephrosis. Urinary bladder is nearly completely decompressed, but otherwise unremarkable in appearance.  Stomach/Bowel: The appearance of the stomach is normal. No pathologic dilatation of small bowel or colon.  Vascular/Lymphatic: Atherosclerosis throughout the abdominal and pelvic vasculature, without evidence of aneurysm or dissection. Similar appearance of a centrally  necrotic right inguinal lymph node, which currently measures 2.6 cm (previously 2.5 cm). No other lymphadenopathy noted in the abdomen or pelvis.  Reproductive: Prostate gland and seminal vesicles are unremarkable in appearance.  Other: No significant volume of ascites.  No pneumoperitoneum.  Musculoskeletal: Worsening osseous metastasis, most notable for an enlarging lesion in the left side of the L1 vertebral body, which currently measures 2.9 x 2.8 cm and has a small amount of overlying soft tissue component laterally, and is associated with a pathologic fracture of the superior endplate. There is also an enlarging mixed lytic and sclerotic lesion in the inferior aspect of the L4 vertebral body, and an enlarging lesion in the right side of the L5 vertebral body which currently measures up to 2.2 cm in diameter.  IMPRESSION: 1. Although there has been slight regression of the large perihilar right middle lobe mass, similar to slightly improved mediastinal and right hilar lymphadenopathy, and the hepatic metastasis is essentially stable in size compared to the prior examination, there is otherwise general progression of metastatic disease, with enlargement of a pleural based mass in the right upper lobe, enlargement of bilateral adrenal metastases (including a very large right adrenal mass which is showing early signs of invasion into the inferior vena cava), as well as progressive osseous metastasis, as detailed above. 2. Additional incidental findings, as above.   Electronically Signed   By: Vinnie Langton M.D.   On: 01/27/2015 11:24    ASSESSMENT AND PLAN: This is a very pleasant 69 year old African American male with history of stage IV non-small cell lung cancer, adenocarcinoma and is status post 6 cycles of systemic chemotherapy was carboplatin and Alimta.    Patient was seen and examined with Dr. [name]. His recent restaging CT scan showed evidence for disease progression. Specifically with  enlargement of the pleural based mass in the right upper lobe and enlargement of bilateral adrenal metastasis including a very large right adrenal mass which shows early signs of invasion into the inferior vena. There also was progressive osseous metastasis. He is to begin immunotherapy, first cycle expected 02/21/2015. For the oral candidiasis an additional prescription for Diflucan was sent to his pharmacy of record via E scribe. He is asked to increase his insurer/boost supplements to 3 daily. He is hypotensive today with a blood pressure 79/51 and tachycardic with heart rate of 122 bpm. He will be given a liter of normal saline IV fluids today to address these issues. He was found to be hypokalemic with a potassium of 2.9. He was given 40 mEq of potassium chloride by mouth while in the infusion area and a prescription for K Dur 40 mg by mouth daily for the next 10 days was sent to his pharmacy of record via E scribe. He will present  for cycle #1 of his immunotherapy with Nivolumab on 02/21/2015 as scheduled. He will follow-up in 2 weeks for symptom management visit.  He was advised to call immediately if he has any concerning symptoms in the interval.   The patient voices understanding of current disease status and treatment options and is in agreement with the current care plan.  All questions were answered. The patient knows to call the clinic with any problems, questions or concerns. We can certainly see the patient much sooner if necessary.  Carlton Adam, PA-C 02/16/2015   ADDENDUM: Hematology/Oncology Attending: I had a face to face encounter with the patient. I recommended his care plan. This is a very pleasant 69 years old African-American male with metastatic non-small cell lung cancer, adenocarcinoma status post 6 cycles of induction chemotherapy with carboplatin and Alimta. The recent imaging studies including CT scan of the chest, abdomen and pelvis showed evidence for disease  progression. The patient has been complaining of lack of appetite as well as poor by mouth intake and dehydration. He also lost few pounds recently. He also was found today to have oral thrush. I discussed the scan results with the patient and his family member. I recommended for him treatment with immunotherapy with Nivolumab 3 MG/KG every 2 weeks. He is expected to start the first cycle of this treatment next week. For the dehydration, will start the patient on IV fluid today. For hypokalemia, we will provide the patient with potassium supplement. He would come back for follow-up visit in 3 weeks for reevaluation with the start of cycle #2. The patient was advised to call immediately if he has any concerning symptoms in the interval.  Disclaimer: This note was dictated with voice recognition software. Similar sounding words can inadvertently be transcribed and may not be corrected upon review. Brendan Hooper., MD 02/25/2015

## 2015-02-16 NOTE — Telephone Encounter (Signed)
Labs/flush/ov added per 06/30 POF, pt is aware

## 2015-02-16 NOTE — Patient Instructions (Signed)
Dehydration, Adult Dehydration is when you lose more fluids from the body than you take in. Vital organs like the kidneys, brain, and heart cannot function without a proper amount of fluids and salt. Any loss of fluids from the body can cause dehydration.  CAUSES   Vomiting.  Diarrhea.  Excessive sweating.  Excessive urine output.  Fever. SYMPTOMS  Mild dehydration  Thirst.  Dry lips.  Slightly dry mouth. Moderate dehydration  Very dry mouth.  Sunken eyes.  Skin does not bounce back quickly when lightly pinched and released.  Dark urine and decreased urine production.  Decreased tear production.  Headache. Severe dehydration  Very dry mouth.  Extreme thirst.  Rapid, weak pulse (more than 100 beats per minute at rest).  Cold hands and feet.  Not able to sweat in spite of heat and temperature.  Rapid breathing.  Blue lips.  Confusion and lethargy.  Difficulty being awakened.  Minimal urine production.  No tears. DIAGNOSIS  Your caregiver will diagnose dehydration based on your symptoms and your exam. Blood and urine tests will help confirm the diagnosis. The diagnostic evaluation should also identify the cause of dehydration. TREATMENT  Treatment of mild or moderate dehydration can often be done at home by increasing the amount of fluids that you drink. It is best to drink small amounts of fluid more often. Drinking too much at one time can make vomiting worse. Refer to the home care instructions below. Severe dehydration needs to be treated at the hospital where you will probably be given intravenous (IV) fluids that contain water and electrolytes. HOME CARE INSTRUCTIONS   Ask your caregiver about specific rehydration instructions.  Drink enough fluids to keep your urine clear or pale yellow.  Drink small amounts frequently if you have nausea and vomiting.  Eat as you normally do.  Avoid:  Foods or drinks high in sugar.  Carbonated  drinks.  Juice.  Extremely hot or cold fluids.  Drinks with caffeine.  Fatty, greasy foods.  Alcohol.  Tobacco.  Overeating.  Gelatin desserts.  Wash your hands well to avoid spreading bacteria and viruses.  Only take over-the-counter or prescription medicines for pain, discomfort, or fever as directed by your caregiver.  Ask your caregiver if you should continue all prescribed and over-the-counter medicines.  Keep all follow-up appointments with your caregiver. SEEK MEDICAL CARE IF:  You have abdominal pain and it increases or stays in one area (localizes).  You have a rash, stiff neck, or severe headache.  You are irritable, sleepy, or difficult to awaken.  You are weak, dizzy, or extremely thirsty. SEEK IMMEDIATE MEDICAL CARE IF:   You are unable to keep fluids down or you get worse despite treatment.  You have frequent episodes of vomiting or diarrhea.  You have blood or green matter (bile) in your vomit.  You have blood in your stool or your stool looks black and tarry.  You have not urinated in 6 to 8 hours, or you have only urinated a small amount of very dark urine.  You have a fever.  You faint. MAKE SURE YOU:   Understand these instructions.  Will watch your condition.  Will get help right away if you are not doing well or get worse. Document Released: 08/05/2005 Document Revised: 10/28/2011 Document Reviewed: 03/25/2011 ExitCare Patient Information 2015 [employer]. This information is not intended to replace advice given to you by your health care provider. Make sure you discuss any questions you have with your health care   provider.  

## 2015-02-16 NOTE — Telephone Encounter (Signed)
per pof to sch pt appt-sent MW email to sch trmt-gave pt avs-per Adrena add pt w/MM on 8/1

## 2015-02-16 NOTE — Telephone Encounter (Signed)
OK to refill them.

## 2015-02-16 NOTE — Telephone Encounter (Signed)
Sister, [name] called and requested medication refills.  Patient needs diflucan (she called pharmacy, but they said they had not heard back from [place]) and he also needs oxycodone.   Please call her back at [phone]. (Note: [facility] not listed in Epic).

## 2015-02-17 ENCOUNTER — Encounter: Payer: Self-pay | Admitting: Radiation Oncology

## 2015-02-17 ENCOUNTER — Telehealth: Payer: Self-pay | Admitting: Radiation Oncology

## 2015-02-17 ENCOUNTER — Telehealth: Payer: Self-pay | Admitting: *Deleted

## 2015-02-17 LAB — TSH CHCC: TSH: 1.402 m[IU]/L (ref 0.320–4.118)

## 2015-02-17 NOTE — Telephone Encounter (Signed)
Per staff message and POF I have scheduled appts. Advised scheduler of appts. JMW  

## 2015-02-17 NOTE — Telephone Encounter (Signed)
Received message from Dr. Tammi Klippel that the patient wants to know that date of his original diagnosis. Phoned patient. Explained biopsy was done 08/08/14 and final result was obtained 09/06/14. Patient verbalized understanding.

## 2015-02-20 ENCOUNTER — Encounter: Payer: Self-pay | Admitting: Internal Medicine

## 2015-02-20 NOTE — Patient Instructions (Addendum)
Return on 02/21/2015 as previously scheduled to begin your immunotherapy Increase your ingestion of ensure or boost to at least 3 daily Take the potassium 2 tablets by mouth daily for the next 10 days to address your low potassium level Follow-up in 2 weeks from your first cycle of immunotherapy

## 2015-02-21 ENCOUNTER — Ambulatory Visit (HOSPITAL_BASED_OUTPATIENT_CLINIC_OR_DEPARTMENT_OTHER): Payer: Medicare Other

## 2015-02-21 ENCOUNTER — Other Ambulatory Visit: Payer: Self-pay | Admitting: Nurse Practitioner

## 2015-02-21 VITALS — BP 104/63 | HR 102 | Temp 97.6°F

## 2015-02-21 DIAGNOSIS — C3411 Malignant neoplasm of upper lobe, right bronchus or lung: Secondary | ICD-10-CM | POA: Diagnosis present

## 2015-02-21 DIAGNOSIS — Z5112 Encounter for antineoplastic immunotherapy: Secondary | ICD-10-CM | POA: Diagnosis not present

## 2015-02-21 DIAGNOSIS — C3491 Malignant neoplasm of unspecified part of right bronchus or lung: Secondary | ICD-10-CM

## 2015-02-21 LAB — COMPREHENSIVE METABOLIC PANEL (CC13)
ALBUMIN: 2.6 g/dL — AB (ref 3.5–5.0)
ALK PHOS: 69 U/L (ref 40–150)
ALT: 7 U/L (ref 0–55)
AST: 22 U/L (ref 5–34)
Anion Gap: 16 mEq/L — ABNORMAL HIGH (ref 3–11)
BUN: 7.2 mg/dL (ref 7.0–26.0)
CALCIUM: 10.4 mg/dL (ref 8.4–10.4)
CHLORIDE: 99 meq/L (ref 98–109)
CO2: 26 mEq/L (ref 22–29)
Creatinine: 0.7 mg/dL (ref 0.7–1.3)
EGFR: >90 mL/min/{1.73_m2} (ref >90)
Glucose: 130 mg/dl (ref 70–140)
POTASSIUM: 3.4 meq/L — AB (ref 3.5–5.1)
SODIUM: 141 meq/L (ref 136–145)
TOTAL PROTEIN: 7 g/dL (ref 6.4–8.3)
Total Bilirubin: 0.45 mg/dL (ref 0.20–1.20)

## 2015-02-21 MED ORDER — HEPARIN SOD (PORK) LOCK FLUSH 100 UNIT/ML IV SOLN
500.0000 [IU] | Freq: Once | INTRAVENOUS | Status: AC | PRN
  Administered 2015-02-21: 500 [IU]
  Filled 2015-02-21: qty 5

## 2015-02-21 MED ORDER — SODIUM CHLORIDE 0.9 % IJ SOLN
10.0000 mL | INTRAMUSCULAR | Status: DC | PRN
  Administered 2015-02-21: 10 mL
  Filled 2015-02-21: qty 10

## 2015-02-21 MED ORDER — FLUCONAZOLE 100 MG PO TABS: 100.0000 mg | ORAL_TABLET | Freq: Every day | ORAL | Status: DC

## 2015-02-21 MED ORDER — SODIUM CHLORIDE 0.9 % IV SOLN
3.1000 mg/kg | Freq: Once | INTRAVENOUS | Status: AC
  Administered 2015-02-21: 180 mg via INTRAVENOUS
  Filled 2015-02-21: qty 18

## 2015-02-21 MED ORDER — SODIUM CHLORIDE 0.9 % IV SOLN
Freq: Once | INTRAVENOUS | Status: AC
  Administered 2015-02-21: 14:00:00 via INTRAVENOUS

## 2015-02-21 NOTE — Progress Notes (Signed)
Pt presented with symptomatic hypotension. Reports dizziness. Has had only 6 oz of beer and "a little water with my pills" to drink or eat today. Note patient's mouth and skin are dry. Pt with hypokalemia last week. No labs ordered for today. Notified Selena Lesser, NP. Received orders for liter of saline, recheck CMET and ok to proceed with Nivoluomab.

## 2015-02-21 NOTE — Patient Instructions (Signed)
[facility] Discharge Instructions for Patients Receiving Chemotherapy  Today you received the following chemotherapy agents Opdivo.  To help prevent nausea and vomiting after your treatment, we encourage you to take your nausea medication.   If you develop nausea and vomiting that is not controlled by your nausea medication, call the clinic.   BELOW ARE SYMPTOMS THAT SHOULD BE REPORTED IMMEDIATELY:  *FEVER GREATER THAN 100.5 F  *CHILLS WITH OR WITHOUT FEVER  NAUSEA AND VOMITING THAT IS NOT CONTROLLED WITH YOUR NAUSEA MEDICATION  *UNUSUAL SHORTNESS OF BREATH  *UNUSUAL BRUISING OR BLEEDING  TENDERNESS IN MOUTH AND THROAT WITH OR WITHOUT PRESENCE OF ULCERS  *URINARY PROBLEMS  *BOWEL PROBLEMS  UNUSUAL RASH Items with * indicate a potential emergency and should be followed up as soon as possible.  Feel free to call the clinic you have any questions or concerns. The clinic phone number is [phone].  Please show the CHEMO ALERT CARD at check-in to the Emergency Department and triage nurse.   

## 2015-02-22 ENCOUNTER — Telehealth: Payer: Self-pay | Admitting: *Deleted

## 2015-02-22 NOTE — Telephone Encounter (Signed)
Oncology Nurse Navigator Documentation  Oncology Nurse Navigator Flowsheets 02/22/2015  Navigator Encounter Type Telephone/Called patient to check on his smoking cessation.  I was unable to leave a vm message.    Treatment Phase Treatment  Time Spent with Patient 15

## 2015-02-23 ENCOUNTER — Encounter: Payer: Self-pay | Admitting: *Deleted

## 2015-02-23 NOTE — Progress Notes (Signed)
[place] Work  Clinical Social Work was referred by patient's sister, [name] for assessment of psychosocial needs.  Per sister, she has concerns about pt living alone in motel and states he needs more assistance. CSW reviewed chart and per inpt PT notes they recommended home safety evaluation through [facility] after discharge. This appears a good place to start to further assess needs in the home. CSW left vm for desk RN and sent via in basket to RN navigator. CSW available to further problem solve/assist as needed.   Clinical Social Work interventions: Coordination of resources  Loren Racer, [place] Worker [place]  [place] Phone: [phone] Fax: [phone]

## 2015-03-05 ENCOUNTER — Encounter (HOSPITAL_COMMUNITY): Payer: Self-pay

## 2015-03-05 ENCOUNTER — Inpatient Hospital Stay (HOSPITAL_COMMUNITY)
Admission: EM | Admit: 2015-03-05 | Discharge: 2015-03-07 | DRG: 180 | Disposition: A | Discharge status: Home Health | Payer: Medicare Other | Attending: Internal Medicine | Admitting: Internal Medicine

## 2015-03-05 ENCOUNTER — Emergency Department (HOSPITAL_COMMUNITY): Payer: Medicare Other

## 2015-03-05 DIAGNOSIS — Z823 Family history of stroke: Secondary | ICD-10-CM | POA: Diagnosis not present

## 2015-03-05 DIAGNOSIS — C7931 Secondary malignant neoplasm of brain: Secondary | ICD-10-CM | POA: Diagnosis present

## 2015-03-05 DIAGNOSIS — J189 Pneumonia, unspecified organism: Secondary | ICD-10-CM

## 2015-03-05 DIAGNOSIS — C7951 Secondary malignant neoplasm of bone: Secondary | ICD-10-CM | POA: Diagnosis present

## 2015-03-05 DIAGNOSIS — W19XXXA Unspecified fall, initial encounter: Secondary | ICD-10-CM | POA: Diagnosis present

## 2015-03-05 DIAGNOSIS — C3490 Malignant neoplasm of unspecified part of unspecified bronchus or lung: Secondary | ICD-10-CM | POA: Diagnosis not present

## 2015-03-05 DIAGNOSIS — Z9221 Personal history of antineoplastic chemotherapy: Secondary | ICD-10-CM | POA: Diagnosis not present

## 2015-03-05 DIAGNOSIS — R918 Other nonspecific abnormal finding of lung field: Secondary | ICD-10-CM | POA: Diagnosis present

## 2015-03-05 DIAGNOSIS — G893 Neoplasm related pain (acute) (chronic): Secondary | ICD-10-CM | POA: Diagnosis not present

## 2015-03-05 DIAGNOSIS — Z79899 Other long term (current) drug therapy: Secondary | ICD-10-CM | POA: Diagnosis not present

## 2015-03-05 DIAGNOSIS — Z681 Body mass index (BMI) 19 or less, adult: Secondary | ICD-10-CM | POA: Diagnosis not present

## 2015-03-05 DIAGNOSIS — R55 Syncope and collapse: Secondary | ICD-10-CM | POA: Diagnosis present

## 2015-03-05 DIAGNOSIS — E279 Disorder of adrenal gland, unspecified: Secondary | ICD-10-CM | POA: Diagnosis present

## 2015-03-05 DIAGNOSIS — D571 Sickle-cell disease without crisis: Secondary | ICD-10-CM | POA: Diagnosis present

## 2015-03-05 DIAGNOSIS — C3491 Malignant neoplasm of unspecified part of right bronchus or lung: Principal | ICD-10-CM | POA: Diagnosis present

## 2015-03-05 DIAGNOSIS — R197 Diarrhea, unspecified: Secondary | ICD-10-CM | POA: Diagnosis present

## 2015-03-05 DIAGNOSIS — E860 Dehydration: Secondary | ICD-10-CM | POA: Diagnosis present

## 2015-03-05 DIAGNOSIS — E876 Hypokalemia: Secondary | ICD-10-CM | POA: Diagnosis present

## 2015-03-05 DIAGNOSIS — R296 Repeated falls: Secondary | ICD-10-CM | POA: Diagnosis present

## 2015-03-05 DIAGNOSIS — E43 Unspecified severe protein-calorie malnutrition: Secondary | ICD-10-CM | POA: Diagnosis present

## 2015-03-05 DIAGNOSIS — Z79891 Long term (current) use of opiate analgesic: Secondary | ICD-10-CM | POA: Diagnosis not present

## 2015-03-05 DIAGNOSIS — D6481 Anemia due to antineoplastic chemotherapy: Secondary | ICD-10-CM | POA: Diagnosis present

## 2015-03-05 DIAGNOSIS — Z809 Family history of malignant neoplasm, unspecified: Secondary | ICD-10-CM | POA: Diagnosis not present

## 2015-03-05 DIAGNOSIS — Z7952 Long term (current) use of systemic steroids: Secondary | ICD-10-CM

## 2015-03-05 DIAGNOSIS — F17210 Nicotine dependence, cigarettes, uncomplicated: Secondary | ICD-10-CM | POA: Diagnosis present

## 2015-03-05 DIAGNOSIS — K219 Gastro-esophageal reflux disease without esophagitis: Secondary | ICD-10-CM | POA: Diagnosis present

## 2015-03-05 DIAGNOSIS — R627 Adult failure to thrive: Secondary | ICD-10-CM | POA: Diagnosis present

## 2015-03-05 DIAGNOSIS — B370 Candidal stomatitis: Secondary | ICD-10-CM | POA: Diagnosis present

## 2015-03-05 DIAGNOSIS — I10 Essential (primary) hypertension: Secondary | ICD-10-CM | POA: Diagnosis present

## 2015-03-05 DIAGNOSIS — R531 Weakness: Secondary | ICD-10-CM | POA: Diagnosis not present

## 2015-03-05 LAB — CBC WITH DIFFERENTIAL/PLATELET
Basophils Absolute: 0 10*3/uL (ref 0.0–0.1)
Basophils Relative: 0 % (ref 0–1)
EOS ABS: 0.1 10*3/uL (ref 0.0–0.7)
Eosinophils Relative: 2 % (ref 0–5)
HCT: 27.5 % — ABNORMAL LOW (ref 39.0–52.0)
HEMOGLOBIN: 9.4 g/dL — AB (ref 13.0–17.0)
LYMPHS ABS: 0.8 10*3/uL (ref 0.7–4.0)
Lymphocytes Relative: 9 % — ABNORMAL LOW (ref 12–46)
MCH: 27.2 pg (ref 26.0–34.0)
MCHC: 34.2 g/dL (ref 30.0–36.0)
MCV: 79.7 fL (ref 78.0–100.0)
Monocytes Absolute: 0.9 10*3/uL (ref 0.1–1.0)
Monocytes Relative: 11 % (ref 3–12)
NEUTROS ABS: 6.6 10*3/uL (ref 1.7–7.7)
Neutrophils Relative %: 78 % — ABNORMAL HIGH (ref 43–77)
Platelets: 392 10*3/uL (ref 150–400)
RBC: 3.45 MIL/uL — ABNORMAL LOW (ref 4.22–5.81)
RDW: 17.1 % — AB (ref 11.5–15.5)
WBC: 8.5 10*3/uL (ref 4.0–10.5)

## 2015-03-05 LAB — I-STAT CHEM 8, ED
BUN: 8 mg/dL (ref 6–20)
CHLORIDE: 99 mmol/L — AB (ref 101–111)
CREATININE: 0.6 mg/dL — AB (ref 0.61–1.24)
Calcium, Ion: 1.27 mmol/L (ref 1.13–1.30)
Glucose, Bld: 114 mg/dL — ABNORMAL HIGH (ref 65–99)
HEMATOCRIT: 26 % — AB (ref 39.0–52.0)
Hemoglobin: 8.8 g/dL — ABNORMAL LOW (ref 13.0–17.0)
POTASSIUM: 2.8 mmol/L — AB (ref 3.5–5.1)
SODIUM: 139 mmol/L (ref 135–145)
TCO2: 26 mmol/L (ref 0–100)

## 2015-03-05 LAB — URINE MICROSCOPIC-ADD ON

## 2015-03-05 LAB — URINALYSIS, ROUTINE W REFLEX MICROSCOPIC
Glucose, UA: NEGATIVE mg/dL
HGB URINE DIPSTICK: NEGATIVE
Ketones, ur: >80 mg/dL — AB
LEUKOCYTES UA: NEGATIVE
NITRITE: NEGATIVE
Protein, ur: 30 mg/dL — AB
Specific Gravity, Urine: 1.028 (ref 1.005–1.030)
UROBILINOGEN UA: 0.2 mg/dL (ref 0.0–1.0)
pH: 6 (ref 5.0–8.0)

## 2015-03-05 LAB — CREATININE, SERUM
Creatinine, Ser: 0.65 mg/dL (ref 0.61–1.24)
GFR calc Af Amer: >60 mL/min (ref >60)
GFR calc non Af Amer: >60 mL/min (ref >60)

## 2015-03-05 LAB — I-STAT CG4 LACTIC ACID, ED: LACTIC ACID, VENOUS: 0.85 mmol/L (ref 0.5–2.0)

## 2015-03-05 LAB — CLOSTRIDIUM DIFFICILE BY PCR: CDIFFPCR: NEGATIVE

## 2015-03-05 LAB — MAGNESIUM: MAGNESIUM: 1.8 mg/dL (ref 1.7–2.4)

## 2015-03-05 MED ORDER — POTASSIUM CHLORIDE CRYS ER 20 MEQ PO TBCR
60.0000 meq | EXTENDED_RELEASE_TABLET | Freq: Once | ORAL | Status: AC
  Administered 2015-03-05: 60 meq via ORAL
  Filled 2015-03-05: qty 3

## 2015-03-05 MED ORDER — SODIUM CHLORIDE 0.9 % IV SOLN
INTRAVENOUS | Status: DC
Start: 2015-03-05 — End: 2015-03-07
  Administered 2015-03-05: 100 mL/h via INTRAVENOUS

## 2015-03-05 MED ORDER — SODIUM CHLORIDE 0.9 % IV BOLUS (SEPSIS)
1000.0000 mL | Freq: Once | INTRAVENOUS | Status: AC
Start: 2015-03-05 — End: 2015-03-05
  Administered 2015-03-05: 1000 mL via INTRAVENOUS

## 2015-03-05 MED ORDER — ONDANSETRON HCL 4 MG/2ML IJ SOLN: 4.0000 mg | Freq: Four times a day (QID) | INTRAMUSCULAR | Status: DC | PRN

## 2015-03-05 MED ORDER — MORPHINE SULFATE 2 MG/ML IJ SOLN: 2.0000 mg | INTRAMUSCULAR | Status: DC | PRN

## 2015-03-05 MED ORDER — SODIUM CHLORIDE 0.9 % IJ SOLN: 3.0000 mL | Freq: Two times a day (BID) | INTRAMUSCULAR | Status: DC

## 2015-03-05 MED ORDER — POTASSIUM CHLORIDE 10 MEQ/100ML IV SOLN
10.0000 meq | INTRAVENOUS | Status: AC
  Administered 2015-03-05 (×3): 10 meq via INTRAVENOUS
  Filled 2015-03-05 (×4): qty 100

## 2015-03-05 MED ORDER — FLUCONAZOLE 100 MG PO TABS
100.0000 mg | ORAL_TABLET | Freq: Every day | ORAL | Status: DC
  Administered 2015-03-05 – 2015-03-07 (×3): 100 mg via ORAL
  Filled 2015-03-05 (×3): qty 1

## 2015-03-05 MED ORDER — POTASSIUM CHLORIDE 10 MEQ/100ML IV SOLN
10.0000 meq | INTRAVENOUS | Status: DC
  Administered 2015-03-05 (×2): 10 meq via INTRAVENOUS
  Filled 2015-03-05 (×2): qty 100

## 2015-03-05 MED ORDER — ACETAMINOPHEN 325 MG PO TABS: 650.0000 mg | ORAL_TABLET | Freq: Four times a day (QID) | ORAL | Status: DC | PRN

## 2015-03-05 MED ORDER — ALUM & MAG HYDROXIDE-SIMETH 200-200-20 MG/5ML PO SUSP: 30.0000 mL | Freq: Four times a day (QID) | ORAL | Status: DC | PRN

## 2015-03-05 MED ORDER — ONDANSETRON HCL 4 MG PO TABS: 4.0000 mg | ORAL_TABLET | Freq: Four times a day (QID) | ORAL | Status: DC | PRN

## 2015-03-05 MED ORDER — ACETAMINOPHEN 650 MG RE SUPP: 650.0000 mg | Freq: Four times a day (QID) | RECTAL | Status: DC | PRN

## 2015-03-05 MED ORDER — ENOXAPARIN SODIUM 40 MG/0.4ML ~~LOC~~ SOLN
40.0000 mg | SUBCUTANEOUS | Status: DC
  Administered 2015-03-05 – 2015-03-07 (×3): 40 mg via SUBCUTANEOUS
  Filled 2015-03-05 (×3): qty 0.4

## 2015-03-05 MED ORDER — VITAMIN B-1 100 MG PO TABS
100.0000 mg | ORAL_TABLET | Freq: Every day | ORAL | Status: DC
  Administered 2015-03-05 – 2015-03-07 (×3): 100 mg via ORAL
  Filled 2015-03-05 (×3): qty 1

## 2015-03-05 MED ORDER — OXYCODONE HCL 5 MG PO TABS
5.0000 mg | ORAL_TABLET | ORAL | Status: DC | PRN
  Administered 2015-03-05 – 2015-03-07 (×8): 5 mg via ORAL
  Filled 2015-03-05 (×8): qty 1

## 2015-03-05 MED ORDER — FOLIC ACID 1 MG PO TABS
1.0000 mg | ORAL_TABLET | Freq: Every day | ORAL | Status: DC
  Administered 2015-03-05 – 2015-03-07 (×3): 1 mg via ORAL
  Filled 2015-03-05 (×3): qty 1

## 2015-03-05 NOTE — Progress Notes (Signed)
   [facility]                                                                              Patient Demographics  Brendan Hooper, is a 69 y.o. male, DOB - 1946/07/25, SPQ:[id number]  Admit date - 03/05/2015   Admitting Physician Kelvin Cellar, MD  Outpatient Primary MD for the patient is [name], MD  LOS - 0   Chief Complaint  Patient presents with  . Weakness  . Neck Pain      HPI on 03/05/2015 by Dr. Kelvin Cellar Brendan Hooper is a 69 y.o. male with a past medical history of stage IV non-small cell lung cancer diagnosed in December 2015 with a history of undergoing systemic chemotherapy, having a CT scan of cervical spine performed on 02/08/2015 showing progressive bony metastatic disease involving C2 and T1, CT scan of abdomen and pelvis performed on 01/27/2015 showing slight regression of large perihilar right middle lobe mass, similar mediastinal and right hilar lymphadenopathy, hepatic metastasis stable, otherwise there is general progression of metastatic disease elsewhere with enlargement of pleural-based mass in the right upper lobe, enlargement of bilateral adrenal masses along with progressive osseous metastasis. He was recently discharged from the medicine service on 02/12/2015 at which time he presented with syncope likely secondary to significant dehydration and electrolyte abnormalities. He presents to the emergency room with complaints of generalized weakness, recurrent falls, dizziness, lightheadedness, having difficulties performing activities of daily living at home. He also complains of watery diarrhea over the past several days. Chest x-ray performed in the emergency department showing right lung mass and probable postobstructive changes appearing to be stable since prior study.  Assessment & Plan   Patient admitted earlier this morning by Dr. Coralyn Pear.  Please see full H&P for details.  Agree with current assessment and plan.   Generalized weakness  with frequent falls -Patient does have history of stage IV lung cancer -Pending orthostatic vitals -Possibly secondary to dehydration and poor oral intake -Continue IVF -PT consulted and recommended home health  Stage 4 non-small cell lung cancer -CT scan 01/26/2005 showed evidence of progression of metastatic disease -Chest x-ray showed right lung mass along with probable postobstructive lung changes -Currently patient is afebrile with no leukocytosis -Maintaining oxygen saturations above 90s on room air -Patient denies any shortness of breath or cough -Repeat chest x-ray in the morning  Dehydration -Secondary to poor oral intake versus diarrhea -Continue IV fluids  Diarrhea -C. difficile PCR negative  Hypokalemia -Potassium 2.8 upon admission -Continue to replaced and monitor BMP -magnesium level 1.8  Normocytic Anemia -Possible secondary to recent malignancy/chemotherapy -Will obtain anemia panel -Continue to monitor CBC  Code Status: Full  Family Communication: None at bedside  Disposition Plan: Admitted.   Time Spent in minutes   30 minutes  Procedures  None  Consults   None  DVT Prophylaxis  Lovenox   Rayni Nemitz D.O. on 03/05/2015 at 1:50 PM  Between 7am to 7pm - Pager - [phone]  After 7pm go to [email or web address] - password [email or web address]  And look for the night coverage person covering for me after hours  [facility] Office  [phone]

## 2015-03-05 NOTE — ED Notes (Signed)
Bed: WA08 Expected date:  Expected time:  Means of arrival:  Comments: 

## 2015-03-05 NOTE — ED Notes (Signed)
Bedside report given to [place], Therapist, sports.

## 2015-03-05 NOTE — H&P (Signed)
[facility] History and Physical  Brendan Hooper [name] DOB: 07/13/1946 DOA: 03/05/2015  Referring physician:  PCP: [name], MD   Chief Complaint: Generalized weakness  HPI: Brendan Hooper is a 69 y.o. male  with a past medical history of stage IV non-small cell lung cancer diagnosed in December 2015 with a history of undergoing systemic chemotherapy, having a CT scan of cervical spine performed on 02/08/2015 showing progressive bony metastatic disease involving C2 and  T1, CT scan of abdomen and pelvis performed on 01/27/2015 showing slight regression of large perihilar right middle lobe mass, similar mediastinal and right hilar lymphadenopathy, hepatic metastasis stable, otherwise there is general progression of metastatic disease elsewhere with enlargement of pleural-based mass in the right upper lobe, enlargement of bilateral adrenal masses along with progressive osseous metastasis. He was recently discharged from the medicine service on 02/12/2015 at which time he presented with syncope likely secondary to significant dehydration and electrolyte abnormalities. He presents to the emergency room with complaints of generalized weakness, recurrent falls, dizziness, lightheadedness, having difficulties performing activities of daily living at home. He also complains of watery diarrhea over the past several days.  Chest x-ray performed in the emergency department showing right lung mass and probable postobstructive changes appearing to be stable since prior study.                                                                     Review of Systems:  Constitutional:  No weight loss, night sweats, Fevers, chills, positive for fatigue, generalized weakness, recurrent falls.  HEENT:  No headaches, Difficulty swallowing,Tooth/dental problems,Sore throat,  No sneezing, itching, ear ache, nasal congestion, post nasal drip,  Cardio-vascular:  No chest pain, Orthopnea, PND, swelling in  lower extremities, anasarca, dizziness, palpitations  GI:  No heartburn, indigestion, abdominal pain, nausea, vomiting, positive for diarrhea, change in bowel habits, loss of appetite  Resp:  No shortness of breath with exertion or at rest. No excess mucus, no productive cough, No non-productive cough, No coughing up of blood.No change in color of mucus.No wheezing.No chest wall deformity  Skin:  no rash or lesions.  GU:  no dysuria, change in color of urine, no urgency or frequency. No flank pain.  Musculoskeletal:  No joint pain or swelling. No decreased range of motion. No back pain.  Psych:  No change in mood or affect. No depression or anxiety. No memory loss.   Past Medical History  Diagnosis Date  . GERD (gastroesophageal reflux disease)   . Headache(784.0)   . Sickle cell anemia   . Lung mass   . Hypertension   . 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 No past surgeries    . Portacath placement Left 09/01/2014    Procedure: INSERTION PORT-A-CATH;  Surgeon: Melrose Nakayama, MD;  Location: [facility];  Service: Thoracic;  Laterality: Left;   Social History:  reports that he has been smoking Cigarettes.  He has a 10 pack-year smoking history. He has never used smokeless tobacco. He reports that he drinks alcohol. He reports that he does not use illicit drugs.  No Known Allergies  Family History  Problem Relation Age of Onset  . Cancer Mother   . Stroke Father      Prior to Admission medications   Medication Sig Start Date End Date Taking? Authorizing Provider  cholecalciferol (VITAMIN D) 1000 UNITS tablet Take 1,000 Units by mouth daily.   Yes Historical Provider, MD  dexamethasone (DECADRON) 4 MG tablet 4 mg by mouth twice a day the day before, day of and day after the chemotherapy every 3 weeks Patient taking differently: Take 4 mg by mouth 2 (two)  times daily. Take 4 mg by mouth twice a day the day before, day of and day after the chemotherapy every 3 weeks 12/14/14  Yes Curt Bears, MD  fluconazole (DIFLUCAN) 100 MG tablet Take 1 tablet (100 mg total) by mouth daily. 02/21/15  Yes Susanne Borders, NP  folic acid (FOLVITE) 1 MG tablet Take 1 tablet (1 mg total) by mouth daily. 08/11/14  Yes Modena Jansky, MD  lidocaine-prilocaine (EMLA) cream Apply 1 application topically as needed. Patient taking differently: Apply 1 application topically as needed (port access).  09/02/14  Yes Curt Bears, MD  Multiple Vitamin (MULTIVITAMIN WITH MINERALS) TABS tablet Take 1 tablet by mouth daily. 08/11/14  Yes Modena Jansky, MD  oxyCODONE (OXY IR/ROXICODONE) 5 MG immediate release tablet Take 1-2 tablets (5-10 mg total) by mouth every 4 (four) hours as needed for moderate pain. 02/16/15  Yes Adrena E Johnson, PA-C  potassium chloride SA (K-DUR,KLOR-CON) 20 MEQ tablet Take 1 tablet (20 mEq total) by mouth 2 (two) times daily. 01/02/15  Yes Curt Bears, MD  prochlorperazine (COMPAZINE) 10 MG tablet Take 1 tablet (10 mg total) by mouth every 6 (six) hours as needed for nausea or vomiting. 01/20/15  Yes Carlton Adam, PA-C  thiamine 100 MG tablet Take 1 tablet (100 mg total) by mouth daily. 08/11/14  Yes Modena Jansky, MD  nicotine (NICODERM CQ - DOSED IN MG/24 HOURS) 21 mg/24hr patch Place 1 patch (21 mg total) onto the skin daily. Patient not taking: Reported on 03/05/2015 12/15/14   Curt Bears, MD  potassium chloride SA (K-DUR,KLOR-CON) 20 MEQ tablet Take 2 tablets (40 mEq total) by mouth once. Patient not taking: Reported on 03/05/2015 02/16/15   Carlton Adam, PA-C   Physical Exam: Filed Vitals:   03/05/15 0250 03/05/15 0330  BP: 96/65 97/63  Pulse: 95 100  Temp: 98.1 F (36.7 C)   TempSrc: Oral   Resp: 20 24  SpO2: 99% 100%    Wt Readings from Last 3 Encounters:  02/16/15 56.7 kg (125 lb)  02/08/15 59.2 kg (130 lb 8.2 oz)    01/30/15 57.879 kg (127 lb 9.6 oz)    General:  Appears calm and comfortable, no acute distress. Chronically ill-appearing, cachectic, thin Eyes: PERRL, normal lids, irises & conjunctiva ENT: grossly normal hearing, having dry oral mucosa Neck: no LAD, masses or thyromegaly Cardiovascular: Tachycardic, RRR, no m/r/g. No LE edema. Telemetry: SR, no arrhythmias  Respiratory: Patient having a normal respiratory effort, no wheezes rhonchi or rales, breathing comfortably on room air Abdomen: soft, ntnd Skin: no rash or induration seen on limited exam Musculoskeletal: grossly normal tone BUE/BLE Psychiatric: grossly normal mood and affect, speech fluent and appropriate Neurologic: grossly non-focal.          Labs on Admission:  Basic Metabolic Panel:  Recent Labs Lab 03/05/15 0444  NA 139  K 2.8*  CL 99*  GLUCOSE 114*  BUN  8  CREATININE 0.60*   Liver Function Tests: No results for input(s): AST, ALT, ALKPHOS, BILITOT, PROT, ALBUMIN in the last 168 hours. No results for input(s): LIPASE, AMYLASE in the last 168 hours. No results for input(s): AMMONIA in the last 168 hours. CBC:  Recent Labs Lab 03/05/15 0435 03/05/15 0444  WBC 8.5  --   NEUTROABS 6.6  --   HGB 9.4* 8.8*  HCT 27.5* 26.0*  MCV 79.7  --   PLT 392  --    Cardiac Enzymes: No results for input(s): CKTOTAL, CKMB, CKMBINDEX, TROPONINI in the last 168 hours.  BNP (last 3 results) No results for input(s): BNP in the last 8760 hours.  ProBNP (last 3 results) No results for input(s): PROBNP in the last 8760 hours.  CBG: No results for input(s): GLUCAP in the last 168 hours.  Radiological Exams on Admission: Dg Chest 2 View  03/05/2015   CLINICAL DATA:  Weakness. History of sickle cell anemia, lung mass, hypertension, bronchogenic cancer of right lung, and brain cancer. Current smoker. Now complains of generalized weakness, neck pain for 3 months, burning sensation, and malodorous urine.  EXAM: CHEST  2  VIEW  COMPARISON:  02/07/2015  FINDINGS: Normal heart size and pulmonary vascularity. Left central venous catheter with tip over the low SVC unchanged in position. Emphysematous changes in the lungs. Right mid lung mass measures 5.4 x 7 cm. Size is similar to prior study. Focal infiltration in the right upper lung is probably postobstructive. Left lung is clear. Old right rib fractures. Calcified and tortuous aorta. No blunting of costophrenic angles. No pneumothorax.  IMPRESSION: Right lung mass and probable postobstructive change appears stable since prior study. Emphysematous changes in the lungs. No acute process identified.   Electronically Signed   By: Lucienne Capers M.D.   On: 03/05/2015 04:55    EKG: Independently reviewed.   Assessment/Plan Principal Problem:   Generalized weakness Active Problems:   FTT (failure to thrive) in adult   Hypokalemia   Bronchogenic cancer of right lung   Dehydration   1. Generalized weakness. Patient with history of stage IV lung cancer, presenting to the emergency department with complaints of generalized weakness having recurrent falls, state functional decline, minimal by mouth intake. I suspect symptoms secondary to dehydration. Will check orthostatic vital signs. Will provide IV fluid resuscitation and correct electrolytes. Consult physical therapy 2. Stage IV non-small cell lung cancer. Patient being followed at the [facility] with restaging CT scans performed on 01/27/2015 showing evidence of progression of metastatic disease. He had a chest x-ray done in the emergency department that showed right lung mass along with probable postobstructive lung changes appear to be stable when compared to prior study. He is nontoxic, afebrile, labs showing normal white count, satting upper 90s on room air without complaints of cough or shortness of breath. He does not appear to have active infection thus will hold antimicrobial therapy for now. Repeat chest x-ray  tomorrow morning 3. Dehydration. He presents with generalized weakness, having hypotension in the emergency room that responded to IV fluids. This likely resulted from multiple factors including diarrhea, decreased by mouth intake, metastatic cancer. Will provide IV fluid resuscitation with normal saline. Follow-up on orthostatic vital signs. 4. Diarrhea. Patient reporting loose stools over the past several days, will check a stool for C. Difficile. 5. Hypokalemia. Labs showing a potassium of 2.8, will replace with IV potassium chloride. Check magnesium level. 6. DVT prophylaxis. Lovenox   Code Status: Full code  Family Communication: Family not present Disposition Plan: Anticipate patient will require greater than 2 nights hospitalization  Time spent: 65 minutes  Kelvin Cellar [facility] Pager [phone]

## 2015-03-05 NOTE — ED Notes (Signed)
According to EMS, pt c/o of generalized weakness, neck pain x3 months, burning sensation and malodor upon urination.

## 2015-03-05 NOTE — Evaluation (Signed)
Physical Therapy Evaluation Patient Details Name: Brendan Hooper MRN: [id number] DOB: [birth year] Today's Date: 03/05/2015   History of Present Illness  69 yo male admitted with dizziness, weakness. 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   Clinical Impression  On eval, pt required Min guard assist for mobility-walked ~100 feet while holding onto IV pole for stability. Pt reports bil LEs pain. Denies lightheadedness/dizziness even while ambulating. + orthostatics (please see vitals section).     Follow Up Recommendations Home health PT (depending on progress)    Equipment Recommendations  None recommended by PT    Recommendations for Other Services       Precautions / Restrictions Precautions Precautions: Fall Precaution Comments: +orthostatics Restrictions Weight Bearing Restrictions: No      Mobility  Bed Mobility Overal bed mobility: Modified Independent             General bed mobility comments: Increased time  Transfers Overall transfer level: Modified independent               General transfer comment: Increased time.   Ambulation/Gait Ambulation/Gait assistance: Min guard Ambulation Distance (Feet): 100 Feet (IV pole)   Gait Pattern/deviations: Step-through pattern;Antalgic     General Gait Details: slow gait speed. Pt denied lightheadedness/dizziness. Pt reports bil LE pain  Stairs            Wheelchair Mobility    Modified Rankin (Stroke Patients Only)       Balance           Standing balance support: During functional activity Standing balance-Leahy Scale: Good                               Pertinent Vitals/Pain Pain Assessment: Faces Faces Pain Scale: Hurts little more Pain Location: bil LEs, neck Pain Descriptors / Indicators: Sore    Home Living Family/patient expects to be discharged to:: Private residence Living Arrangements: Non-relatives/Friends     Home Access: Stairs to enter   Engineer, site of Steps: 1 Home Layout: One level Home Equipment: Environmental consultant - 2 wheels;Cane - single point      Prior Function Level of Independence: Independent               Hand Dominance        Extremity/Trunk Assessment   Upper Extremity Assessment: Generalized weakness           Lower Extremity Assessment: Generalized weakness      Cervical / Trunk Assessment: Kyphotic  Communication   Communication: No difficulties  Cognition                            General Comments      Exercises        Assessment/Plan    PT Assessment Patient needs continued PT services  PT Diagnosis Difficulty walking;Generalized weakness   PT Problem List Decreased strength;Decreased activity tolerance;Decreased balance;Decreased mobility  PT Treatment Interventions Gait training;Functional mobility training;Therapeutic activities;Therapeutic exercise;Patient/family education;Balance training   PT Goals (Current goals can be found in the Care Plan section) Acute Rehab PT Goals Patient Stated Goal: none stated PT Goal Formulation: With patient Time For Goal Achievement: 03/19/15 Potential to Achieve Goals: Good    Frequency Min 3X/week   Barriers to discharge        Co-evaluation  End of Session Equipment Utilized During Treatment: Gait belt Activity Tolerance: Patient tolerated treatment well Patient left: in bed;with call bell/phone within reach           Time: 2103-1281 PT Time Calculation (min) (ACUTE ONLY): 15 min   Charges:   PT Evaluation $Initial PT Evaluation Tier I: 1 Procedure     PT G Codes:        Weston Anna, MPT Pager: [phone]

## 2015-03-05 NOTE — ED Notes (Signed)
Bed: WA09 Expected date:  Expected time:  Means of arrival:  Comments: Hold per charge

## 2015-03-05 NOTE — ED Provider Notes (Signed)
CSN: [id number]     Arrival date & time 03/05/15  2595 History   First MD Initiated Contact with Patient 03/05/15 0402     Chief Complaint  Patient presents with  . Weakness  . Neck Pain     (Consider location/radiation/quality/duration/timing/severity/associated sxs/prior Treatment) HPI  Brendan Hooper is a 69 y.o. male with past medical history of right lung cancer presenting today with presyncope and weakness. Patient states he normally falls, possibly once a week. Today he fell 3 times. He states he's been weak and lightheaded. He denies true syncope or falling and hitting his head. He states that he becomes very dizzy and has to guide himself to the floor. He also complains of neck pain which has been chronic for him for the last 3 weeks and is intermittent. He denies fevers or recent infections. Patient did recently get chemotherapy for his lung cancer. He is otherwise had a normal appetite and been his normal state of health. He has no further complaints.  10 Systems reviewed and are negative for acute change except as noted in the HPI.    Past Medical History  Diagnosis Date  . GERD (gastroesophageal reflux disease)   . Headache(784.0)   . Sickle cell anemia   . Lung mass   . Hypertension   . 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 No past surgeries    . Portacath placement Left 09/01/2014    Procedure: INSERTION PORT-A-CATH;  Surgeon: Melrose Nakayama, MD;  Location: [facility];  Service: Thoracic;  Laterality: Left;   Family History  Problem Relation Age of Onset  . Cancer Mother   . Stroke Father    History  Substance Use Topics  . Smoking status: Current Every Day Smoker -- 0.25 packs/day for 40 years    Types: Cigarettes  . Smokeless tobacco: Never Used  . Alcohol Use: 0.0 oz/week    0 Standard drinks or equivalent per week     Comment: social     Review of Systems    Allergies  Review of patient's allergies indicates no known allergies.  Home Medications   Prior to Admission medications   Medication Sig Start Date End Date Taking? Authorizing Provider  cholecalciferol (VITAMIN D) 1000 UNITS tablet Take 1,000 Units by mouth daily.   Yes Historical Provider, MD  dexamethasone (DECADRON) 4 MG tablet 4 mg by mouth twice a day the day before, day of and day after the chemotherapy every 3 weeks Patient taking differently: Take 4 mg by mouth 2 (two) times daily. Take 4 mg by mouth twice a day the day before, day of and day after the chemotherapy every 3 weeks 12/14/14  Yes Curt Bears, MD  fluconazole (DIFLUCAN) 100 MG tablet Take 1 tablet (100 mg total) by mouth daily. 02/21/15  Yes Susanne Borders, NP  folic acid (FOLVITE) 1 MG tablet Take 1 tablet (1 mg total) by mouth daily. 08/11/14  Yes Modena Jansky, MD  lidocaine-prilocaine (EMLA) cream Apply 1 application topically as needed. Patient taking differently: Apply 1 application topically as needed (port access).  09/02/14  Yes Curt Bears, MD  Multiple Vitamin (MULTIVITAMIN WITH MINERALS) TABS tablet Take 1 tablet by mouth daily. 08/11/14  Yes Modena Jansky, MD  oxyCODONE (OXY IR/ROXICODONE) 5 MG immediate release tablet Take 1-2 tablets (5-10 mg total) by  mouth every 4 (four) hours as needed for moderate pain. 02/16/15  Yes Adrena E Johnson, PA-C  potassium chloride SA (K-DUR,KLOR-CON) 20 MEQ tablet Take 1 tablet (20 mEq total) by mouth 2 (two) times daily. 01/02/15  Yes Curt Bears, MD  prochlorperazine (COMPAZINE) 10 MG tablet Take 1 tablet (10 mg total) by mouth every 6 (six) hours as needed for nausea or vomiting. 01/20/15  Yes Carlton Adam, PA-C  thiamine 100 MG tablet Take 1 tablet (100 mg total) by mouth daily. 08/11/14  Yes Modena Jansky, MD  nicotine (NICODERM CQ - DOSED IN MG/24 HOURS) 21 mg/24hr patch Place 1 patch (21 mg total) onto the skin  daily. Patient not taking: Reported on 03/05/2015 12/15/14   Curt Bears, MD  potassium chloride SA (K-DUR,KLOR-CON) 20 MEQ tablet Take 2 tablets (40 mEq total) by mouth once. Patient not taking: Reported on 03/05/2015 02/16/15   Burnetta Sabin E Johnson, PA-C   BP 97/63 mmHg  Pulse 100  Temp(Src) 98.1 F (36.7 C) (Oral)  Resp 24  SpO2 100% Physical Exam  Constitutional: He is oriented to person, place, and time. Vital signs are normal. He appears well-developed and well-nourished.  Non-toxic appearance. He does not appear ill. No distress.  HENT:  Head: Normocephalic and atraumatic.  Nose: Nose normal.  Mouth/Throat: Oropharynx is clear and moist. No oropharyngeal exudate.  Eyes: Conjunctivae and EOM are normal. Pupils are equal, round, and reactive to light. No scleral icterus.  Neck: Normal range of motion. Neck supple. No tracheal deviation, no edema, no erythema and normal range of motion present. No thyroid mass and no thyromegaly present.  Cardiovascular: Regular rhythm, S1 normal, S2 normal, normal heart sounds, intact distal pulses and normal pulses.  Exam reveals no gallop and no friction rub.   No murmur heard. Pulses:      Radial pulses are 2+ on the right side, and 2+ on the left side.       Dorsalis pedis pulses are 2+ on the right side, and 2+ on the left side.  Tachycardia  Pulmonary/Chest: Effort normal and breath sounds normal. No respiratory distress. He has no wheezes. He has no rhonchi. He has no rales.  Abdominal: Soft. Normal appearance and bowel sounds are normal. He exhibits no distension, no ascites and no mass. There is no hepatosplenomegaly. There is no tenderness. There is no rebound, no guarding and no CVA tenderness.  Musculoskeletal: Normal range of motion. He exhibits no edema or tenderness.  Lymphadenopathy:    He has no cervical adenopathy.  Neurological: He is alert and oriented to person, place, and time. He has normal strength. No cranial nerve deficit or  sensory deficit.  Skin: Skin is warm, dry and intact. No petechiae and no rash noted. He is not diaphoretic. No erythema. No pallor.  Psychiatric: He has a normal mood and affect. His behavior is normal. Judgment normal.  Nursing note and vitals reviewed.   ED Course  Procedures (including critical care time) Labs Review Labs Reviewed  CBC WITH DIFFERENTIAL/PLATELET - Abnormal; Notable for the following:    RBC 3.45 (*)    Hemoglobin 9.4 (*)    HCT 27.5 (*)    RDW 17.1 (*)    Neutrophils Relative % 78 (*)    Lymphocytes Relative 9 (*)    All other components within normal limits  I-STAT CHEM 8, ED - Abnormal; Notable for the following:    Potassium 2.8 (*)    Chloride 99 (*)  Creatinine, Ser 0.60 (*)    Glucose, Bld 114 (*)    Hemoglobin 8.8 (*)    HCT 26.0 (*)    All other components within normal limits  URINALYSIS, ROUTINE W REFLEX MICROSCOPIC (NOT AT [facility])  I-STAT CG4 LACTIC ACID, ED    Imaging Review Dg Chest 2 View  03/05/2015   CLINICAL DATA:  Weakness. History of sickle cell anemia, lung mass, hypertension, bronchogenic cancer of right lung, and brain cancer. Current smoker. Now complains of generalized weakness, neck pain for 3 months, burning sensation, and malodorous urine.  EXAM: CHEST  2 VIEW  COMPARISON:  02/07/2015  FINDINGS: Normal heart size and pulmonary vascularity. Left central venous catheter with tip over the low SVC unchanged in position. Emphysematous changes in the lungs. Right mid lung mass measures 5.4 x 7 cm. Size is similar to prior study. Focal infiltration in the right upper lung is probably postobstructive. Left lung is clear. Old right rib fractures. Calcified and tortuous aorta. No blunting of costophrenic angles. No pneumothorax.  IMPRESSION: Right lung mass and probable postobstructive change appears stable since prior study. Emphysematous changes in the lungs. No acute process identified.   Electronically Signed   By: Lucienne Capers M.D.   On:  03/05/2015 04:55     EKG Interpretation None      MDM   Final diagnoses:  Weakness   patient presents emergency department for weakness and frequent falls. ED workup reveals postobstructive consolidation. This is unchanged from previous however may be causing symptoms now requiring treatment. He has intermittent episodes of tachycardia as well as high as 112 in emergency department. Blood pressure has been a little low as well, 90s over 60s. His baseline appears to be in the 1 teens.  I spoke with the [facility] hospitalist who will admit the patient for further workup.  Everlene Balls, MD 03/05/15 [phone]

## 2015-03-05 NOTE — ED Notes (Signed)
Pt is unable to give urine specimen at this time urinal left at bedside.

## 2015-03-05 NOTE — ED Notes (Signed)
Patient transported to X-ray 

## 2015-03-06 ENCOUNTER — Other Ambulatory Visit: Payer: Medicare Other

## 2015-03-06 ENCOUNTER — Inpatient Hospital Stay (HOSPITAL_COMMUNITY): Payer: Medicare Other

## 2015-03-06 ENCOUNTER — Telehealth: Payer: Self-pay | Admitting: Medical Oncology

## 2015-03-06 ENCOUNTER — Ambulatory Visit: Payer: Medicare Other

## 2015-03-06 ENCOUNTER — Ambulatory Visit: Payer: Medicare Other | Admitting: Physician Assistant

## 2015-03-06 ENCOUNTER — Encounter (HOSPITAL_COMMUNITY): Payer: Self-pay | Admitting: *Deleted

## 2015-03-06 DIAGNOSIS — G893 Neoplasm related pain (acute) (chronic): Secondary | ICD-10-CM

## 2015-03-06 DIAGNOSIS — B370 Candidal stomatitis: Secondary | ICD-10-CM

## 2015-03-06 DIAGNOSIS — C3411 Malignant neoplasm of upper lobe, right bronchus or lung: Secondary | ICD-10-CM

## 2015-03-06 DIAGNOSIS — E860 Dehydration: Secondary | ICD-10-CM

## 2015-03-06 DIAGNOSIS — R531 Weakness: Secondary | ICD-10-CM

## 2015-03-06 DIAGNOSIS — R627 Adult failure to thrive: Secondary | ICD-10-CM

## 2015-03-06 DIAGNOSIS — E876 Hypokalemia: Secondary | ICD-10-CM

## 2015-03-06 LAB — CBC
HEMATOCRIT: 25.1 % — AB (ref 39.0–52.0)
Hemoglobin: 8.5 g/dL — ABNORMAL LOW (ref 13.0–17.0)
MCH: 26.6 pg (ref 26.0–34.0)
MCHC: 33.9 g/dL (ref 30.0–36.0)
MCV: 78.7 fL (ref 78.0–100.0)
Platelets: 339 10*3/uL (ref 150–400)
RBC: 3.19 MIL/uL — ABNORMAL LOW (ref 4.22–5.81)
RDW: 17.1 % — ABNORMAL HIGH (ref 11.5–15.5)
WBC: 6.8 10*3/uL (ref 4.0–10.5)

## 2015-03-06 LAB — BASIC METABOLIC PANEL
Anion gap: 6 (ref 5–15)
CALCIUM: 8.9 mg/dL (ref 8.9–10.3)
CHLORIDE: 107 mmol/L (ref 101–111)
CO2: 26 mmol/L (ref 22–32)
Creatinine, Ser: 0.48 mg/dL — ABNORMAL LOW (ref 0.61–1.24)
GFR calc Af Amer: >60 mL/min (ref >60)
Glucose, Bld: 105 mg/dL — ABNORMAL HIGH (ref 65–99)
Potassium: 3.1 mmol/L — ABNORMAL LOW (ref 3.5–5.1)
Sodium: 139 mmol/L (ref 135–145)

## 2015-03-06 MED ORDER — ENSURE ENLIVE PO LIQD
237.0000 mL | Freq: Three times a day (TID) | ORAL | Status: DC
  Administered 2015-03-06 – 2015-03-07 (×2): 237 mL via ORAL

## 2015-03-06 MED ORDER — POTASSIUM CHLORIDE 10 MEQ/100ML IV SOLN
10.0000 meq | INTRAVENOUS | Status: AC
  Administered 2015-03-06 (×3): 10 meq via INTRAVENOUS
  Filled 2015-03-06 (×3): qty 100

## 2015-03-06 MED ORDER — SODIUM CHLORIDE 0.9 % IJ SOLN
10.0000 mL | INTRAMUSCULAR | Status: DC | PRN
  Administered 2015-03-07: 10 mL
  Filled 2015-03-06: qty 40

## 2015-03-06 NOTE — Progress Notes (Signed)
Physical Therapy Treatment Patient Details Name: Parv Manthey MRN: [id number] DOB: [birth year] Today's Date: March 30, 2015    History of Present Illness 69 yo male admitted with dizziness, weakness. 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     PT Comments    Pt ambulated in hallway holding IV pole, no unsteadiness observed.  Pt denies dizziness however BP upon return to room after ambulating was 89/75 mmHg and HR 126 bpm.  RN notified of BP post-gait.  Pt reports possible d/c home tomorrow.   Follow Up Recommendations  Home health PT     Equipment Recommendations  None recommended by PT    Recommendations for Other Services       Precautions / Restrictions Precautions Precautions: Fall Precaution Comments: +orthostatics    Mobility  Bed Mobility Overal bed mobility: Modified Independent                Transfers Overall transfer level: Modified independent                  Ambulation/Gait Ambulation/Gait assistance: Supervision;Min guard Ambulation Distance (Feet): 200 Feet Assistive device: None Gait Pattern/deviations: Step-through pattern     General Gait Details: pt pushed IV pole, slow gait speed, denies lightheadedness/dizziness   Stairs            Wheelchair Mobility    Modified Rankin (Stroke Patients Only)       Balance                                    Cognition Arousal/Alertness: Awake/alert Behavior During Therapy: WFL for tasks assessed/performed Overall Cognitive Status: Within Functional Limits for tasks assessed                      Exercises      General Comments        Pertinent Vitals/Pain Pain Assessment: No/denies pain    Home Living                      Prior Function            PT Goals (current goals can now be found in the care plan section) Progress towards PT goals: Progressing toward goals    Frequency  Min 3X/week    PT Plan Current plan remains  appropriate    Co-evaluation             End of Session   Activity Tolerance: Patient tolerated treatment well Patient left: in bed;with call bell/phone within reach;with bed alarm set     Time: 1425-1437 PT Time Calculation (min) (ACUTE ONLY): 12 min  Charges:  $Gait Training: 8-22 mins                    G Codes:      Juliana Boling,KATHrine E 03/30/2015, 2:44 PM Carmelia Bake, PT, DPT Mar 30, 2015 Pager: [phone]

## 2015-03-06 NOTE — Progress Notes (Signed)
Utilization review completed.  

## 2015-03-06 NOTE — Progress Notes (Signed)
DIAGNOSIS: stage IV (T3, N2, M1 B) non-small cell lung cancer, adenocarcinoma presented with large right lung mass in addition to mediastinal lymphadenopathy and metastatic lesion in the right thigh diagnosed in December 2015.  PRIOR THERAPY: Systemic chemotherapy with carboplatin AUC of 5 and Alimta 500 mg/m started on 09/05/2014. Status post 6 cycles.  CURRENT THERAPY: Immunotherapy with development at 3 mg/kg given every 2 weeks, status post 1 cycle. First cycle on 02/21/2015.  Subjective: The patient is seen and examined today. He is feeling a little bit better except for occasional dizzy spells. He was admitted with syncopal episode after dehydration. He was started on IV hydration and feeling a little bit better. He denied having any significant fever or chills. He has no nausea or vomiting. He is expected to start the second cycle of his immunotherapy today.  Objective: Vital signs in last 24 hours: Temp:  [97.9 F (36.6 C)-98.4 F (36.9 C)] 98.4 F (36.9 C) (07/18 0529) Pulse Rate:  [81-101] 81 (07/18 0529) Resp:  [16-18] 18 (07/18 0529) BP: (107-114)/(66-71) 114/67 mmHg (07/18 0529) SpO2:  [95 %-100 %] 99 % (07/18 0529)  Intake/Output from previous day: 07/17 0701 - 07/18 0700 In: 3120 [P.O.:720; I.V.:2400] Out: 725 [Urine:725] Intake/Output this shift:    General appearance: alert, cooperative, fatigued and no distress Resp: clear to auscultation bilaterally Cardio: regular rate and rhythm, S1, S2 normal, no murmur, click, rub or gallop GI: soft, non-tender; bowel sounds normal; no masses,  no organomegaly Extremities: extremities normal, atraumatic, no cyanosis or edema  Lab Results:   Recent Labs  03/05/15 0435 03/05/15 0444 03/06/15 0701  WBC 8.5  --  6.8  HGB 9.4* 8.8* 8.5*  HCT 27.5* 26.0* 25.1*  PLT 392  --  339   BMET  Recent Labs  03/05/15 0444 03/06/15 0701  NA 139 139  K 2.8* 3.1*  CL 99* 107  CO2  --  26  GLUCOSE 114* 105*  BUN 8 <5*   CREATININE 0.60* 0.48*  CALCIUM  --  8.9    Studies/Results: Dg Chest 2 View  03/05/2015   CLINICAL DATA:  Weakness. History of sickle cell anemia, lung mass, hypertension, bronchogenic cancer of right lung, and brain cancer. Current smoker. Now complains of generalized weakness, neck pain for 3 months, burning sensation, and malodorous urine.  EXAM: CHEST  2 VIEW  COMPARISON:  02/07/2015  FINDINGS: Normal heart size and pulmonary vascularity. Left central venous catheter with tip over the low SVC unchanged in position. Emphysematous changes in the lungs. Right mid lung mass measures 5.4 x 7 cm. Size is similar to prior study. Focal infiltration in the right upper lung is probably postobstructive. Left lung is clear. Old right rib fractures. Calcified and tortuous aorta. No blunting of costophrenic angles. No pneumothorax.  IMPRESSION: Right lung mass and probable postobstructive change appears stable since prior study. Emphysematous changes in the lungs. No acute process identified.   Electronically Signed   By: Lucienne Capers M.D.   On: 03/05/2015 04:55    Medications: I have reviewed the patient's current medications.   Assessment/Plan: 1) metastatic non-small cell lung cancer, adenocarcinoma status post induction chemotherapy with carboplatin and Alimta discontinued secondary to disease progression and currently undergoing treatment with immunotherapy with Nivolumab status post 1 cycle. He is expected to start cycle #2 today. I will delay cycle #2 by 1 week to give the patient some time to recover from his recent admission. 2) hypertension and dizzy spells: Secondary to dehydration and  lack of by mouth intake. Continue current IV hydration with normal saline. 3) pain management: Secondary to metastatic bone disease. Continue current pain medication with morphine when necessary and change to OxyIR on discharge. 4) oral thrush: Continue Diflucan 5) hypokalemia: KCl supplements. Thank you so  much for taking good care of Mr. Muff, I will continue to follow up the patient with you and assist in his management on as-needed basis.  LOS: 1 day    Nasim Garofano K. 03/06/2015

## 2015-03-06 NOTE — Telephone Encounter (Signed)
Pt in hospital -cancel appt.

## 2015-03-06 NOTE — Progress Notes (Signed)
Patient ID: Brendan Hooper, male   DOB: Dec 27, 1945, 69 y.o.   MRN: [id number] [facility] PROGRESS NOTE  Brendan Hooper HYW:[id number] DOB: Dec 06, 1945 DOA: 03/05/2015 PCP: [name], MD  Brief narrative:    69 y.o. male with a past medical history of stage IV non-small cell lung cancer diagnosed in December 2015 when patient presented with a large right lung mass in addition to mediastinal lymphadenopathy and metastatic lesion in the right thigh, status post systemic chemotherapy started in January 2016, status post 6 cycles, on immunotherapy status post 1 cycle received 02/21/2015. He has been hospitalized on 02/12/2015 for syncope thought to be secondary to dehydration and electrolyte abnormalities. He presented to [facility] long hospital with generalized weakness and recurrent falls, lightheadedness. He also presented with diarrhea. Patient was hemodynamically stable on the admission. His chest x-ray demonstrated right lung mass and possible postobstructive changes that appear to be stable since prior imaging studies. No acute process identified.  Anticipated discharge: 03/07/2015. Per physical therapy - recommendation for home health.   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  Diarrhea - C. difficile PCR negative - 2 bowel movements in past 24 hours  Hypokalemia - Likely from poor by mouth intake - Supplemented  Anemia of chronic disease - Secondary to malignancy and sequela of chemotherapy - Hemoglobin is stable at 8.5 - No current indications for transfusion   DVT Prophylaxis  - Lovenox subcutaneous ordered   Code Status: Full.  Family Communication:  plan of care discussed with the patient Disposition Plan: Home likely 03/07/2015    IV access:  Peripheral IV  Procedures and diagnostic studies:    X-ray Chest Pa And Lateral 03/06/2015   Interval clearing of atelectasis or pneumonia peripheral to the stable dominant central right lung mass. Minimal left basilar subsegmental atelectasis.   Electronically Signed   By: David  Martinique M.D.   On: 03/06/2015 08:19   Dg Chest 2 View 03/05/2015   Right lung mass and probable postobstructive change appears stable since prior study. Emphysematous changes in the lungs. No acute process identified.   Electronically Signed   By: Lucienne Capers M.D.   On: 03/05/2015 04:55    Medical Consultants:  Oncology, Dr. Lorna Few  Other Consultants:  Physical therapy Nutrition  IAnti-Infectives:   None    Leisa Lenz, MD  [facility] Pager [phone]  Time spent in minutes: 15 minutes  If 7PM-7AM, please contact night-coverage [email or web address] Password [facility] 03/06/2015, 11:23 AM   LOS: 1 day    HPI/Subjective: No acute overnight events. Patient reports feeling weak.  Objective: Filed Vitals:   03/05/15 1331 03/05/15 2131 03/06/15 0458 03/06/15 0529  BP: 110/71 114/66 107/67 114/67  Pulse: 95 101 101 81  Temp: 97.9 F (36.6 C) 98.4 F (36.9 C) 98.3 F (36.8 C) 98.4 F (36.9  C)  TempSrc: Oral Oral Oral Oral  Resp: '16 16 16 18  '$ Height:      Weight:      SpO2: 100% 100% 95% 99%    Intake/Output Summary (Last 24 hours) at 03/06/15  1123 Last data filed at 03/06/15 0921  Gross per 24 hour  Intake   2640 ml  Output    875 ml  Net   1765 ml    Exam:   General:  Pt is alert, follows commands appropriately, not in acute distress  Cardiovascular: Regular rate and rhythm, S1/S2 (+)  Respiratory: Clear to auscultation bilaterally, no wheezing, no crackles, no rhonchi  Abdomen: Soft, non tender, non distended, bowel sounds present  Extremities: No edema, pulses DP and PT palpable bilaterally  Neuro: Grossly nonfocal  Data Reviewed: Basic Metabolic Panel:  Recent Labs Lab 03/05/15 0435 03/05/15 0444 03/06/15 0701  NA  --  139 139  K  --  2.8* 3.1*  CL  --  99* 107  CO2  --   --  26  GLUCOSE  --  114* 105*  BUN  --  8 <5*  CREATININE 0.65 0.60* 0.48*  CALCIUM  --   --  8.9  MG 1.8  --   --    Liver Function Tests: No results for input(s): AST, ALT, ALKPHOS, BILITOT, PROT, ALBUMIN in the last 168 hours. No results for input(s): LIPASE, AMYLASE in the last 168 hours. No results for input(s): AMMONIA in the last 168 hours. CBC:  Recent Labs Lab 03/05/15 0435 03/05/15 0444 03/06/15 0701  WBC 8.5  --  6.8  NEUTROABS 6.6  --   --   HGB 9.4* 8.8* 8.5*  HCT 27.5* 26.0* 25.1*  MCV 79.7  --  78.7  PLT 392  --  339   Cardiac Enzymes: No results for input(s): CKTOTAL, CKMB, CKMBINDEX, TROPONINI in the last 168 hours. BNP: Invalid input(s): POCBNP CBG: No results for input(s): GLUCAP in the last 168 hours.  Recent Results (from the past 240 hour(s))  Clostridium Difficile by PCR (not at [facility])     Status: None   Collection Time: 03/05/15  6:50 AM  Result Value Ref Range Status   C difficile by pcr NEGATIVE NEGATIVE Final     Scheduled Meds: . enoxaparin (LOVENOX) injection  40 mg Subcutaneous Q24H  . fluconazole  100 mg Oral Daily  . folic acid  1 mg Oral Daily  . potassium chloride  10 mEq Intravenous Q1 Hr x 3  . thiamine  100 mg Oral Daily   Continuous Infusions: . sodium chloride 100  mL/hr (03/05/15 0706)

## 2015-03-06 NOTE — Progress Notes (Signed)
Initial Nutrition Assessment  DOCUMENTATION CODES:   Severe malnutrition in context of chronic illness, Underweight  INTERVENTION:   Provide Ensure Enlive po TID, each supplement provides 350 kcal and 20 grams of protein Provide daily snack of K[employer] pudding Encourage PO intake RD to continue to monitor  NUTRITION DIAGNOSIS:   Malnutrition related to chronic illness as evidenced by percent weight loss, severe depletion of body fat, severe depletion of muscle mass.  GOAL:   Patient will meet greater than or equal to 90% of their needs  MONITOR:   PO intake, Supplement acceptance, Labs, Weight trends, Skin, I & O's  REASON FOR ASSESSMENT:   Consult Assessment of nutrition requirement/status  ASSESSMENT:   69 y.o. male with a past medical history of stage IV non-small cell lung cancer diagnosed in December 2015 when patient presented with a large right lung mass in addition to mediastinal lymphadenopathy and metastatic lesion in the right thigh, status post systemic chemotherapy started in January 2016, status post 6 cycles, on immunotherapy status post 1 cycle received 02/21/2015.   Pt reports poor appetite. Pt reports feeling like food gets caught in his throat. PO intake: 50%.  Pt states that he tries to drink supplements at home. Provided examples of less expensive supplement options to buy. RD to order Ensure supplements, provided an Ensure to pt during visit.  Per weight history documentation, pt has lost 32 lb since 5/28 (21% weight loss x 1.5 months, significant for time frame).  Nutrition-Focused physical exam completed. Findings are severe fat depletion, severe muscle depletion, and no edema.   Labs reviewed: Low K, BUN, Creatinine Mg WNL  Diet Order:  Diet regular Room service appropriate?: Yes; Fluid consistency:: Thin  Skin:  Reviewed, no issues  Last BM:  7/18  Height:   Ht Readings from Last 1 Encounters:  03/05/15 6' (1.829 m)    Weight:    Wt Readings from Last 1 Encounters:  03/05/15 118 lb 14.4 oz (53.933 kg)    Ideal Body Weight:  80.9 kg  Wt Readings from Last 10 Encounters:  03/05/15 118 lb 14.4 oz (53.933 kg)  02/16/15 125 lb (56.7 kg)  02/08/15 130 lb 8.2 oz (59.2 kg)  01/30/15 127 lb 9.6 oz (57.879 kg)  01/14/15 150 lb (68.04 kg)  01/09/15 139 lb 11.2 oz (63.368 kg)  12/19/14 148 lb 14.4 oz (67.541 kg)  11/28/14 161 lb 12.8 oz (73.392 kg)  11/10/14 151 lb 9.6 oz (68.765 kg)  11/07/14 156 lb (70.761 kg)    BMI:  Body mass index is 16.12 kg/(m^2).  Estimated Nutritional Needs:   Kcal:  1800-2000  Protein:  80-90g  Fluid:  2L/day  EDUCATION NEEDS:   No education needs identified at this time  Clayton Bibles, MS, RD, LDN Pager: [phone] After Hours Pager: [phone]

## 2015-03-07 ENCOUNTER — Ambulatory Visit: Payer: Medicare Other | Admitting: Physician Assistant

## 2015-03-07 ENCOUNTER — Other Ambulatory Visit: Payer: Medicare Other

## 2015-03-07 DIAGNOSIS — C3490 Malignant neoplasm of unspecified part of unspecified bronchus or lung: Secondary | ICD-10-CM

## 2015-03-07 MED ORDER — ONDANSETRON HCL 4 MG PO TABS: 4.0000 mg | ORAL_TABLET | Freq: Four times a day (QID) | ORAL | Status: AC | PRN

## 2015-03-07 MED ORDER — ACETAMINOPHEN 325 MG PO TABS: 650.0000 mg | ORAL_TABLET | Freq: Four times a day (QID) | ORAL | Status: AC | PRN

## 2015-03-07 MED ORDER — HEPARIN SOD (PORK) LOCK FLUSH 100 UNIT/ML IV SOLN
500.0000 [IU] | INTRAVENOUS | Status: AC | PRN
Start: 2015-03-07 — End: 2015-03-07
  Administered 2015-03-07: 500 [IU]

## 2015-03-07 MED ORDER — ALUM & MAG HYDROXIDE-SIMETH 200-200-20 MG/5ML PO SUSP
30.0000 mL | Freq: Four times a day (QID) | ORAL | Status: AC | PRN
Start: 2015-03-07 — End: ?

## 2015-03-07 MED ORDER — ENSURE ENLIVE PO LIQD: 237.0000 mL | Freq: Three times a day (TID) | ORAL | Status: DC

## 2015-03-07 MED ORDER — OXYCODONE HCL 5 MG PO TABS: 5.0000 mg | ORAL_TABLET | ORAL | Status: DC | PRN

## 2015-03-07 NOTE — Discharge Summary (Signed)
Physician Discharge Summary  Brendan Hooper CNO:[id number] DOB: 01-20-1946 DOA: 03/05/2015  PCP: [name], MD  Admit date: 03/05/2015 Discharge date: 03/07/2015  Recommendations for Outpatient Follow-up:  1. No new changes in medications  Discharge Diagnoses:  Principal Problem:   Generalized weakness Active Problems:   Hypokalemia   Bronchogenic cancer of right lung   Dehydration   FTT (failure to thrive) in adult   Weakness    Discharge Condition: stable   Diet recommendation: as tolerated   History of present illness:  69 y.o. male with a past medical history of stage IV non-small cell lung cancer diagnosed in December 2015 when patient presented with a large right lung mass in addition to mediastinal lymphadenopathy and metastatic lesion in the right thigh, status post systemic chemotherapy started in January 2016, status post 6 cycles, on immunotherapy status post 1 cycle received 02/21/2015. He has been hospitalized on 02/12/2015 for syncope thought to be secondary to dehydration and electrolyte abnormalities. He presented to [facility] long hospital with generalized weakness and recurrent falls, lightheadedness. He also presented with diarrhea. Patient was hemodynamically stable on the admission. His chest x-ray demonstrated right lung mass and possible postobstructive changes that appear to be stable since prior imaging studies. No acute process identified.  Hospital Course:    Assessment/Plan:    Principal problem: Generalized weakness with frequent falls - Likely secondary to metastatic lung cancer - Physical therapy has seen the patient in consultation and recommended home health, order placed  Active problems: Failure to thrive in adult / dehydration / severe protein calorie malnutrition - In the context of chronic illness - Patient was seen by nutritionist in consultation - Continue nutritional supplementation  Stage 4 non-small cell lung cancer with  osseous metastasis - CT scan 01/26/2005 showed evidence of progression of metastatic disease - Chemotherapy per oncology schedule  Diarrhea - C. difficile PCR negative - Diarrhea improved  Hypokalemia - Likely from poor by mouth intake - Patient is on twice a day potassium supplementation  Anemia of chronic disease - Secondary to malignancy and sequela of chemotherapy - Hemoglobin is stable   DVT Prophylaxis  - Lovenox subcutaneous ordered while patient in hospital   Code Status: Full.  Family Communication: plan of care discussed with the patient   IV access:  Peripheral IV  Procedures and diagnostic studies:   X-ray Chest Pa And Lateral 03/06/2015 Interval clearing of atelectasis or pneumonia peripheral to the stable dominant central right lung mass. Minimal left basilar subsegmental atelectasis. Electronically Signed By: David Martinique M.D. On: 03/06/2015 08:19   Dg Chest 2 View 03/05/2015 Right lung mass and probable postobstructive change appears stable since prior study. Emphysematous changes in the lungs. No acute process identified. Electronically Signed By: Lucienne Capers M.D. On: 03/05/2015 04:55    Medical Consultants:  Oncology, Dr. Lorna Few  Other Consultants:  Physical therapy Nutrition  IAnti-Infectives:   None      Signed:  Leisa Lenz, MD  [facility] 03/07/2015, 10:18 AM  Pager #: [phone]  Time spent in minutes: less than 30 minutes    Discharge Exam: Filed Vitals:   03/07/15 0447  BP: 98/64  Pulse: 92  Temp: 98.1 F (36.7 C)  Resp: 16   Filed Vitals:   03/06/15 0529 03/06/15 1408 03/06/15 2114 03/07/15 0447  BP: 114/67 1'01/60 88/49 98/64 '$  Pulse: 81 100 108 92  Temp: 98.4 F (36.9 C) 98.1 F (36.7 C) 98 F (36.7 C) 98.1 F (36.7 C)  TempSrc: Oral Oral  Oral Oral  Resp: '18 16 16 16  '$ Height:      Weight:      SpO2: 99% 100% 96% 98%    General: Pt is alert, follows  commands appropriately, not in acute distress Cardiovascular: Regular rate and rhythm, S1/S2 +, no murmurs Respiratory: Clear to auscultation bilaterally, no wheezing, no crackles, no rhonchi Abdominal: Soft, non tender, non distended, bowel sounds +, no guarding Extremities: no edema, no cyanosis, pulses palpable bilaterally DP and PT Neuro: Grossly nonfocal  Discharge Instructions  Discharge Instructions    Call MD for:  difficulty breathing, headache or visual disturbances    Complete by:  As directed      Call MD for:  persistant nausea and vomiting    Complete by:  As directed      Call MD for:  severe uncontrolled pain    Complete by:  As directed      Diet - low sodium heart healthy    Complete by:  As directed      Increase activity slowly    Complete by:  As directed             Medication List    STOP taking these medications        nicotine 21 mg/24hr patch  Commonly known as:  NICODERM CQ - dosed in mg/24 hours      TAKE these medications        acetaminophen 325 MG tablet  Commonly known as:  TYLENOL  Take 2 tablets (650 mg total) by mouth every 6 (six) hours as needed for mild pain (or Fever >/= 101).     alum & mag hydroxide-simeth 200-200-20 MG/5ML suspension  Commonly known as:  MAALOX/MYLANTA  Take 30 mLs by mouth every 6 (six) hours as needed for indigestion or heartburn (dyspepsia).     cholecalciferol 1000 UNITS tablet  Commonly known as:  VITAMIN D  Take 1,000 Units by mouth daily.     dexamethasone 4 MG tablet  Commonly known as:  DECADRON  4 mg by mouth twice a day the day before, day of and day after the chemotherapy every 3 weeks     feeding supplement (ENSURE ENLIVE) Liqd  Take 237 mLs by mouth 3 (three) times daily between meals.     fluconazole 100 MG tablet  Commonly known as:  DIFLUCAN  Take 1 tablet (100 mg total) by mouth daily.     folic acid 1 MG tablet  Commonly known as:  FOLVITE  Take 1 tablet (1 mg total) by mouth  daily.     lidocaine-prilocaine cream  Commonly known as:  EMLA  Apply 1 application topically as needed.     multivitamin with minerals Tabs tablet  Take 1 tablet by mouth daily.     ondansetron 4 MG tablet  Commonly known as:  ZOFRAN  Take 1 tablet (4 mg total) by mouth every 6 (six) hours as needed for nausea.     oxyCODONE 5 MG immediate release tablet  Commonly known as:  Oxy IR/ROXICODONE  Take 1-2 tablets (5-10 mg total) by mouth every 4 (four) hours as needed for moderate pain.     potassium chloride SA 20 MEQ tablet  Commonly known as:  K-DUR,KLOR-CON  Take 1 tablet (20 mEq total) by mouth 2 (two) times daily.     prochlorperazine 10 MG tablet  Commonly known as:  COMPAZINE  Take 1 tablet (10 mg total) by mouth every 6 (six) hours as  needed for nausea or vomiting.     thiamine 100 MG tablet  Take 1 tablet (100 mg total) by mouth daily.           Follow-up Information    Follow up with [name], MD. Schedule an appointment as soon as possible for a visit in 1 week.   Specialty:  Internal Medicine   Why:  Follow up appt after recent hospitalization   Contact information:   [place] [address] [phone]        The results of significant diagnostics from this hospitalization (including imaging, microbiology, ancillary and laboratory) are listed below for reference.    Significant Diagnostic Studies: X-ray Chest Pa And Lateral  03/06/2015   CLINICAL DATA:  Follow-up of pneumonia, history of right-sided lung malignancy with metastatic disease  EXAM: CHEST  2 VIEW  COMPARISON:  Chest x-ray of March 05, 2015  FINDINGS: There remains a dominant right perihilar mass. Peripheral to the mass parenchymal opacification has decreased such that only a small amount remains. The left lung exhibits linear subsegmental atelectasis above the hemidiaphragm. The heart and pulmonary vascularity are normal. The power port appliance tip projects over the  midportion of the SVC. There is no significant pleural effusion. The bony thorax is unremarkable.  IMPRESSION: Interval clearing of atelectasis or pneumonia peripheral to the stable dominant central right lung mass. Minimal left basilar subsegmental atelectasis.   Electronically Signed   By: David  Martinique M.D.   On: 03/06/2015 08:19   Dg Chest 2 View  03/05/2015   CLINICAL DATA:  Weakness. History of sickle cell anemia, lung mass, hypertension, bronchogenic cancer of right lung, and brain cancer. Current smoker. Now complains of generalized weakness, neck pain for 3 months, burning sensation, and malodorous urine.  EXAM: CHEST  2 VIEW  COMPARISON:  02/07/2015  FINDINGS: Normal heart size and pulmonary vascularity. Left central venous catheter with tip over the low SVC unchanged in position. Emphysematous changes in the lungs. Right mid lung mass measures 5.4 x 7 cm. Size is similar to prior study. Focal infiltration in the right upper lung is probably postobstructive. Left lung is clear. Old right rib fractures. Calcified and tortuous aorta. No blunting of costophrenic angles. No pneumothorax.  IMPRESSION: Right lung mass and probable postobstructive change appears stable since prior study. Emphysematous changes in the lungs. No acute process identified.   Electronically Signed   By: Lucienne Capers M.D.   On: 03/05/2015 04:55   Dg Chest 2 View  02/07/2015   CLINICAL DATA:  Acute onset of dizziness and syncope. Status post fall. Initial encounter.  EXAM: CHEST  2 VIEW  COMPARISON:  CT of the chest performed 01/27/2015  FINDINGS: The patient's right middle lobe lung mass is again noted, with overlying peripheral right-sided airspace opacification. No pleural effusion or pneumothorax identified.  The heart remains normal in size. A chest port is noted at the left chest wall, ending at the mid SVC. No acute osseous abnormalities are identified.  IMPRESSION: Right middle lobe lung mass again noted, with overlying  right-sided airspace opacification. No displaced rib fracture seen.   Electronically Signed   By: Garald Balding M.D.   On: 02/07/2015 22:28   Ct Head Wo Contrast  02/08/2015   CLINICAL DATA:  Acute onset of syncope. Hypotension. Dizziness. Current history of lung metastases to the brain. Initial encounter.  EXAM: CT HEAD WITHOUT CONTRAST  TECHNIQUE: Contiguous axial images were obtained from the base of the  skull through the vertex without intravenous contrast.  COMPARISON:  PET/CT performed 09/02/2014, and MRI of the brain performed 09/13/2014  FINDINGS: The patient's previously noted brain metastases are not well characterized on CT. Mild prominence of the ventricles and sulci suggest mild cortical volume loss. Mild cerebellar atrophy is noted. Scattered periventricular white matter change likely reflects small vessel ischemic microangiopathy. No mass effect or midline shift is seen.  There is no evidence of fracture; visualized osseous structures are unremarkable in appearance. The visualized portions of the orbits are within normal limits. The paranasal sinuses and mastoid air cells are well-aerated. No significant soft tissue abnormalities are seen.  IMPRESSION: 1. Previously noted brain metastases are not well characterized on CT. 2. Mild cortical volume loss and mild scattered small vessel ischemic microangiopathy noted.   Electronically Signed   By: Garald Balding M.D.   On: 02/08/2015 00:32   Ct Cervical Spine Wo Contrast  02/08/2015   ADDENDUM REPORT: 02/08/2015 09:26  ADDENDUM: In the body of the report the sentence should read there are now noted lytic lesions identified in the spinous process of C2 as well as within the vertebral body eccentric to the left at T1.   Electronically Signed   By: Inez Catalina M.D.   On: 02/08/2015 09:26   02/08/2015   CLINICAL DATA:  Bilateral neck pain, history of non small cell lung carcinoma with known metastatic lesions to the brain  EXAM: CT CERVICAL SPINE  WITHOUT CONTRAST  TECHNIQUE: Multidetector CT imaging of the cervical spine was performed without intravenous contrast. Multiplanar CT image reconstructions were also generated.  COMPARISON:  09/02/2014  FINDINGS: Seven cervical segments are well visualized. Vertebral body height is well maintained. There are now noted lytic lesions identified in the spinous process of C2 as well as within the body E centric to the left at T1. These were not present on the prior PET-CT examination in January of 2016 and are consistent with progressive bony metastatic lesions. No definitive involvement of the spinal canal is noted although this examination is limited with regards to evaluation of the spinal canal soft tissues.  Degenerative changes are noted at C5-6 and C6-7. No acute fracture or acute facet abnormality is identified. Surrounding soft tissues are within normal limits. The visualized lung apices demonstrate a small pleural based nodule in the right upper lobe posteriorly which measures 7-8 mm. No other significant soft tissue abnormality is noted.  IMPRESSION: Progressive bony metastatic disease at C2 posteriorly and T1 within the vertebral body.  Degenerative changes without acute abnormality.  Small right upper lobe nodule  These results will be called to the ordering clinician or representative by the Radiologist Assistant, and communication documented in the PACS or zVision Dashboard.  Electronically Signed: By: Inez Catalina M.D. On: 02/08/2015 08:27    Microbiology: Recent Results (from the past 240 hour(s))  Clostridium Difficile by PCR (not at [facility])     Status: None   Collection Time: 03/05/15  6:50 AM  Result Value Ref Range Status   C difficile by pcr NEGATIVE NEGATIVE Final     Labs: Basic Metabolic Panel:  Recent Labs Lab 03/05/15 0435 03/05/15 0444 03/06/15 0701  NA  --  139 139  K  --  2.8* 3.1*  CL  --  99* 107  CO2  --   --  26  GLUCOSE  --  114* 105*  BUN  --  8 <5*  CREATININE  0.65 0.60* 0.48*  CALCIUM  --   --  8.9  MG 1.8  --   --    Liver Function Tests: No results for input(s): AST, ALT, ALKPHOS, BILITOT, PROT, ALBUMIN in the last 168 hours. No results for input(s): LIPASE, AMYLASE in the last 168 hours. No results for input(s): AMMONIA in the last 168 hours. CBC:  Recent Labs Lab 03/05/15 0435 03/05/15 0444 03/06/15 0701  WBC 8.5  --  6.8  NEUTROABS 6.6  --   --   HGB 9.4* 8.8* 8.5*  HCT 27.5* 26.0* 25.1*  MCV 79.7  --  78.7  PLT 392  --  339   Cardiac Enzymes: No results for input(s): CKTOTAL, CKMB, CKMBINDEX, TROPONINI in the last 168 hours. BNP: BNP (last 3 results) No results for input(s): BNP in the last 8760 hours.  ProBNP (last 3 results) No results for input(s): PROBNP in the last 8760 hours.  CBG: No results for input(s): GLUCAP in the last 168 hours.

## 2015-03-07 NOTE — Discharge Instructions (Signed)
[facility] Stay Proper nutrition can help your body recover from illness and injury.   Foods and beverages high in protein, vitamins, and minerals help rebuild muscle loss, promote healing, & reduce fall risk.   In addition to eating healthy foods, a nutrition shake is an easy, delicious way to get the nutrition you need during and after your hospital stay  It is recommended that you continue to drink at least 2 bottles per day of:       Ensure Enlive for at least 1 month (30 days) after your hospital stay   Tips for adding a nutrition shake into your routine: As allowed, drink one with vitamins or medications instead of water or juice Enjoy one as a tasty mid-morning or afternoon snack Drink cold or make a milkshake out of it Drink one instead of milk with cereal or snacks Use as a coffee creamer   Available at the following grocery stores and pharmacies:           * [facility] [phone]            For COUPONS visit: www.ensure.com/join or http://dawson-may.com/   Suggested Substitutions Ensure Plus = Boost Plus = Carnation Breakfast Essentials = Boost Compact Ensure Active Clear = Boost Breeze Glucerna Shake = Boost Glucose Control = Carnation Breakfast Essentials SUGAR FREE

## 2015-03-07 NOTE — Care Management Note (Addendum)
Case Management Note  Patient Details  Name: Brendan Hooper MRN: [id number] Date of Birth: July 02, 1946  Subjective/Objective:                   Generalized weakness Action/Plan: Discharge planing  Expected Discharge Date:  03/07/15               Expected Discharge Plan:  [place]  In-House Referral:     Discharge planning Services  CM Consult  Post Acute Care Choice:  Home Health Choice offered to:  Sibling  DME Arranged:    DME Agency:     HH Arranged:  RN, PT, OT, Aide [place] Agency:  [place]  Status of Service:  Completed, signed off  Medicare Important Message Given:    Date Medicare IM Given:    Medicare IM give by:    Date Additional Medicare IM Given:    Additional Medicare Important Message give by:     If discussed at [place] of Stay Meetings, dates discussed:    Additional Comments: CM spoke pt who give permission to arrange all with sister, Brendan Hooper [phone], who states pt does not live at [place] address (this is [name]'s address who handles pt's mail) and pt's physical address is [address] [place], [place] [address] [facility].  Family chooses [facility] to render HHPT/OT/RN/Aide.  Referral called to [facility] rep, [name] with correct address.  No DME needed.  No other CM needs were commmunicated.   Dellie Catholic, RN 03/07/2015, 11:42 AM

## 2015-03-08 ENCOUNTER — Telehealth: Payer: Self-pay | Admitting: Internal Medicine

## 2015-03-08 NOTE — Telephone Encounter (Signed)
Nurse from [facility] called to let PCP know that the patient has declined home health services.

## 2015-03-09 ENCOUNTER — Telehealth: Payer: Self-pay | Admitting: *Deleted

## 2015-03-09 ENCOUNTER — Other Ambulatory Visit: Payer: Self-pay | Admitting: *Deleted

## 2015-03-09 DIAGNOSIS — C7931 Secondary malignant neoplasm of brain: Secondary | ICD-10-CM

## 2015-03-09 DIAGNOSIS — C3491 Malignant neoplasm of unspecified part of right bronchus or lung: Secondary | ICD-10-CM

## 2015-03-09 MED ORDER — OXYCODONE HCL 5 MG PO TABS: 5.0000 mg | ORAL_TABLET | ORAL | Status: DC | PRN

## 2015-03-09 NOTE — Telephone Encounter (Signed)
RX locked up

## 2015-03-09 NOTE — Telephone Encounter (Signed)
I left Brendan Hooper a message that I will call back tomorrow about potassium rx.

## 2015-03-09 NOTE — Telephone Encounter (Signed)
VM message received from pt's sister, Brendan Hooper. She is requesting a refill on pt's oxycodone 5 mg. Last filled at time of discharge from hospital for 20 tabs on 03/07/15. Attempted to call sister at home and cell # no answer. Left VM on both #'s to return call.re: pain

## 2015-03-09 NOTE — Telephone Encounter (Signed)
VM message received from [place] speaks again. And now asking about refill on KCL. Ok to refill? How much ? Last prescription was enough for 1 week. Called back to [place] to let her know pain med prescription is ready for pick up.-Left her a VM on identified phone

## 2015-03-10 NOTE — Telephone Encounter (Signed)
K = 3.1 on 03/06/15.Vaughan Basta stated that since Monday he has been taking potassium 40 meq day and has enough for 1-2 more days. I told her that he needs to finish  that rx and I was not calling in a refill.Marland Kitchen

## 2015-03-12 NOTE — Telephone Encounter (Signed)
Ok to refill 

## 2015-03-20 ENCOUNTER — Ambulatory Visit (HOSPITAL_BASED_OUTPATIENT_CLINIC_OR_DEPARTMENT_OTHER): Payer: Medicare Other

## 2015-03-20 ENCOUNTER — Encounter: Payer: Self-pay | Admitting: Internal Medicine

## 2015-03-20 ENCOUNTER — Telehealth: Payer: Self-pay | Admitting: Internal Medicine

## 2015-03-20 ENCOUNTER — Ambulatory Visit (HOSPITAL_BASED_OUTPATIENT_CLINIC_OR_DEPARTMENT_OTHER): Payer: Medicare Other | Admitting: Internal Medicine

## 2015-03-20 ENCOUNTER — Other Ambulatory Visit (HOSPITAL_BASED_OUTPATIENT_CLINIC_OR_DEPARTMENT_OTHER): Payer: Medicare Other

## 2015-03-20 ENCOUNTER — Other Ambulatory Visit: Payer: Self-pay | Admitting: Medical Oncology

## 2015-03-20 VITALS — BP 71/49 | HR 103 | Temp 97.7°F | Resp 18 | Ht 72.0 in | Wt 116.5 lb

## 2015-03-20 DIAGNOSIS — R531 Weakness: Secondary | ICD-10-CM | POA: Diagnosis not present

## 2015-03-20 DIAGNOSIS — B370 Candidal stomatitis: Secondary | ICD-10-CM

## 2015-03-20 DIAGNOSIS — C3411 Malignant neoplasm of upper lobe, right bronchus or lung: Secondary | ICD-10-CM

## 2015-03-20 DIAGNOSIS — C7931 Secondary malignant neoplasm of brain: Secondary | ICD-10-CM

## 2015-03-20 DIAGNOSIS — Z720 Tobacco use: Secondary | ICD-10-CM

## 2015-03-20 DIAGNOSIS — I951 Orthostatic hypotension: Secondary | ICD-10-CM

## 2015-03-20 DIAGNOSIS — C3491 Malignant neoplasm of unspecified part of right bronchus or lung: Secondary | ICD-10-CM

## 2015-03-20 DIAGNOSIS — R55 Syncope and collapse: Secondary | ICD-10-CM

## 2015-03-20 DIAGNOSIS — Z79899 Other long term (current) drug therapy: Secondary | ICD-10-CM

## 2015-03-20 DIAGNOSIS — Z5112 Encounter for antineoplastic immunotherapy: Secondary | ICD-10-CM | POA: Diagnosis present

## 2015-03-20 DIAGNOSIS — F17200 Nicotine dependence, unspecified, uncomplicated: Secondary | ICD-10-CM

## 2015-03-20 DIAGNOSIS — I959 Hypotension, unspecified: Secondary | ICD-10-CM | POA: Insufficient documentation

## 2015-03-20 LAB — CBC WITH DIFFERENTIAL/PLATELET
BASO%: 0.9 % (ref 0.0–2.0)
BASOS ABS: 0 10*3/uL (ref 0.0–0.1)
EOS ABS: 0.6 10*3/uL — AB (ref 0.0–0.5)
EOS%: 12 % — ABNORMAL HIGH (ref 0.0–7.0)
HCT: 28.3 % — ABNORMAL LOW (ref 38.4–49.9)
HEMOGLOBIN: 9.4 g/dL — AB (ref 13.0–17.1)
LYMPH#: 0.9 10*3/uL (ref 0.9–3.3)
LYMPH%: 18.6 % (ref 14.0–49.0)
MCH: 26.4 pg — ABNORMAL LOW (ref 27.2–33.4)
MCHC: 33.2 g/dL (ref 32.0–36.0)
MCV: 79.5 fL (ref 79.3–98.0)
MONO#: 0.4 10*3/uL (ref 0.1–0.9)
MONO%: 8.6 % (ref 0.0–14.0)
NEUT#: 2.9 10*3/uL (ref 1.5–6.5)
NEUT%: 59.9 % (ref 39.0–75.0)
Platelets: 447 10*3/uL — ABNORMAL HIGH (ref 140–400)
RBC: 3.56 10*6/uL — ABNORMAL LOW (ref 4.20–5.82)
RDW: 19.8 % — ABNORMAL HIGH (ref 11.0–14.6)
WBC: 4.8 10*3/uL (ref 4.0–10.3)

## 2015-03-20 LAB — TSH CHCC: TSH: 1.505 m[IU]/L (ref 0.320–4.118)

## 2015-03-20 MED ORDER — OXYCODONE HCL 5 MG PO TABS: 5.0000 mg | ORAL_TABLET | ORAL | Status: DC | PRN

## 2015-03-20 MED ORDER — FLUCONAZOLE 200 MG PO TABS: 200.0000 mg | ORAL_TABLET | Freq: Every day | ORAL | Status: DC

## 2015-03-20 MED ORDER — HEPARIN SOD (PORK) LOCK FLUSH 100 UNIT/ML IV SOLN
500.0000 [IU] | Freq: Once | INTRAVENOUS | Status: AC | PRN
  Administered 2015-03-20: 500 [IU]
  Filled 2015-03-20: qty 5

## 2015-03-20 MED ORDER — SODIUM CHLORIDE 0.9 % IJ SOLN
10.0000 mL | INTRAMUSCULAR | Status: DC | PRN
  Administered 2015-03-20: 10 mL
  Filled 2015-03-20: qty 10

## 2015-03-20 MED ORDER — SODIUM CHLORIDE 0.9 % IV SOLN
INTRAVENOUS | Status: DC
  Administered 2015-03-20: 12:00:00 via INTRAVENOUS

## 2015-03-20 MED ORDER — SODIUM CHLORIDE 0.9 % IV SOLN
3.0000 mg/kg | Freq: Once | INTRAVENOUS | Status: AC
  Administered 2015-03-20: 170 mg via INTRAVENOUS
  Filled 2015-03-20: qty 17

## 2015-03-20 NOTE — Progress Notes (Signed)
[facility] [phone]   [phone]  OFFICE PROGRESS NOTE  [name], MD [place] [address]  DIAGNOSIS: stage IV (T3, N2, M1 B) non-small cell lung cancer, adenocarcinoma presented with large right lung mass in addition to mediastinal lymphadenopathy and metastatic lesion in the right thigh diagnosed in December 2015.  PRIOR THERAPY: Systemic chemotherapy with carboplatin AUC of 5 and Alimta 500 mg/m started on 09/05/2014. Status post 6 cycles discontinued secondary to disease progression.  CURRENT THERAPY: Immunotherapy with Nivolumab 3 MG/KG every 2 weeks, status post 1 cycle.  INTERVAL HISTORY: Brendan Hooper 69 y.o. male returns to the clinic today for follow-up visit accompanied by family member. The patient had evidence for disease progression after the induction chemotherapy and he is currently undergoing treatment with immunotherapy with Nivolumab status post 1 cycle. He tolerated the first cycle of his treatment well but he continues to have persistent hypotension and decreased by mouth intake. He is not on any blood pressure medication. He also complained of oral thrush and was treated previously with Diflucan 100 mg by mouth daily for 10 days with no improvement. He denied having any significant nausea or vomiting, no fever or chills. He has no chest pain but continues to have shortness of breath with exertion with no cough or hemoptysis. He is here today to start cycle #2 of his immunotherapy.  MEDICAL HISTORY: Past Medical History  Diagnosis Date  . GERD (gastroesophageal reflux disease)   . Headache(784.0)   . Sickle cell anemia   . Lung mass   . Hypertension   . 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  MEDICATIONS:  Current Outpatient Prescriptions  Medication Sig Dispense Refill  . acetaminophen  (TYLENOL) 325 MG tablet Take 2 tablets (650 mg total) by mouth every 6 (six) hours as needed for mild pain (or Fever >/= 101). 30 tablet 0  . alum & mag hydroxide-simeth (MAALOX/MYLANTA) 200-200-20 MG/5ML suspension Take 30 mLs by mouth every 6 (six) hours as needed for indigestion or heartburn (dyspepsia). 355 mL 0  . cholecalciferol (VITAMIN D) 1000 UNITS tablet Take 1,000 Units by mouth daily.    Marland Kitchen dexamethasone (DECADRON) 4 MG tablet 4 mg by mouth twice a day the day before, day of and day after the chemotherapy every 3 weeks (Patient taking differently: Take 4 mg by mouth 2 (two) times daily. Take 4 mg by mouth twice a day the day before, day of and day after the chemotherapy every 3 weeks) 40 tablet 1  . feeding supplement, ENSURE ENLIVE, (ENSURE ENLIVE) LIQD Take 237 mLs by mouth 3 (three) times daily between meals. 237 mL 12  . fluconazole (DIFLUCAN) 100 MG tablet Take 1 tablet (100 mg total) by mouth daily. 14 tablet 1  . folic acid (FOLVITE) 1 MG tablet Take 1 tablet (1 mg total) by mouth daily. 30 tablet 0  . lidocaine-prilocaine (EMLA) cream Apply 1 application topically as needed. (Patient taking differently: Apply 1 application topically as needed (port access). ) 30 g 1  . Multiple Vitamin (MULTIVITAMIN WITH MINERALS) TABS tablet Take 1 tablet by mouth daily. 30 tablet 0  . ondansetron (ZOFRAN) 4 MG tablet Take 1 tablet (4 mg total) by mouth every 6 (six) hours as needed for nausea. 20 tablet 0  . oxyCODONE (OXY  IR/ROXICODONE) 5 MG immediate release tablet Take 1-2 tablets (5-10 mg total) by mouth every 4 (four) hours as needed for moderate pain. 60 tablet 0  . potassium chloride SA (K-DUR,KLOR-CON) 20 MEQ tablet Take 1 tablet (20 mEq total) by mouth 2 (two) times daily. 14 tablet 0  . prochlorperazine (COMPAZINE) 10 MG tablet Take 1 tablet (10 mg total) by mouth every 6 (six) hours as needed for nausea or vomiting. 60 tablet 1  . thiamine 100 MG tablet Take 1 tablet (100 mg total) by  mouth daily. 30 tablet 0   No current facility-administered medications for this visit.   Facility-Administered Medications Ordered in Other Visits  Medication Dose Route Frequency Provider Last Rate Last Dose  . sodium chloride 0.9 % injection 10 mL  10 mL Intravenous PRN Curt Bears, MD   10 mL at 02/16/15 1756    SURGICAL HISTORY:  Past Surgical History  Procedure Laterality Date  . No past surgeries    . Portacath placement Left 09/01/2014    Procedure: INSERTION PORT-A-CATH;  Surgeon: Melrose Nakayama, MD;  Location: [facility];  Service: Thoracic;  Laterality: Left;    REVIEW OF SYSTEMS:  Constitutional: positive for anorexia, fatigue and weight loss Eyes: negative Ears, nose, mouth, throat, and face: positive for sore mouth and Oral thrush Respiratory: positive for dyspnea on exertion Cardiovascular: negative Gastrointestinal: negative Genitourinary:negative Integument/breast: negative Hematologic/lymphatic: negative Musculoskeletal:negative Neurological: negative Behavioral/Psych: negative Endocrine: negative Allergic/Immunologic: negative   PHYSICAL EXAMINATION: General appearance: alert, cooperative, fatigued and no distress Head: Normocephalic, without obvious abnormality, atraumatic Neck: no adenopathy, no JVD, supple, symmetrical, trachea midline and thyroid not enlarged, symmetric, no tenderness/mass/nodules Lymph nodes: Cervical, supraclavicular, and axillary nodes normal. Resp: clear to auscultation bilaterally Back: symmetric, no curvature. ROM normal. No CVA tenderness. Cardio: regular rate and rhythm, S1, S2 normal, no murmur, click, rub or gallop GI: soft, non-tender; bowel sounds normal; no masses,  no organomegaly Extremities: extremities normal, atraumatic, no cyanosis or edema Neurologic: Alert and oriented X 3, normal strength and tone. Normal symmetric reflexes. Normal coordination and gait  ECOG PERFORMANCE STATUS: 2 - Symptomatic, <50% confined  to bed  Blood pressure 71/49, pulse 103, temperature 97.7 F (36.5 C), temperature source Oral, resp. rate 18, height 6' (1.829 m), weight 116 lb 8 oz (52.844 kg), SpO2 100 %.  LABORATORY DATA: Lab Results  Component Value Date   WBC 4.8 03/20/2015   HGB 9.4* 03/20/2015   HCT 28.3* 03/20/2015   MCV 79.5 03/20/2015   PLT 447* 03/20/2015      Chemistry      Component Value Date/Time   NA 139 03/06/2015 0701   NA 141 02/21/2015 1359   K 3.1* 03/06/2015 0701   K 3.4* 02/21/2015 1359   CL 107 03/06/2015 0701   CO2 26 03/06/2015 0701   CO2 26 02/21/2015 1359   BUN <5* 03/06/2015 0701   BUN 7.2 02/21/2015 1359   CREATININE 0.48* 03/06/2015 0701   CREATININE 0.7 02/21/2015 1359      Component Value Date/Time   CALCIUM 8.9 03/06/2015 0701   CALCIUM 10.4 02/21/2015 1359   ALKPHOS 69 02/21/2015 1359   ALKPHOS 61 02/07/2015 2237   AST 22 02/21/2015 1359   AST 17 02/07/2015 2237   ALT 7 02/21/2015 1359   ALT 8* 02/07/2015 2237   BILITOT 0.45 02/21/2015 1359   BILITOT 0.4 02/07/2015 2237       RADIOGRAPHIC STUDIES: X-ray Chest Pa And Lateral  03/06/2015   CLINICAL DATA:  Follow-up of pneumonia, history of right-sided lung malignancy with metastatic disease  EXAM: CHEST  2 VIEW  COMPARISON:  Chest x-ray of March 05, 2015  FINDINGS: There remains a dominant right perihilar mass. Peripheral to the mass parenchymal opacification has decreased such that only a small amount remains. The left lung exhibits linear subsegmental atelectasis above the hemidiaphragm. The heart and pulmonary vascularity are normal. The power port appliance tip projects over the midportion of the SVC. There is no significant pleural effusion. The bony thorax is unremarkable.  IMPRESSION: Interval clearing of atelectasis or pneumonia peripheral to the stable dominant central right lung mass. Minimal left basilar subsegmental atelectasis.   Electronically Signed   By: David  Martinique M.D.   On: 03/06/2015 08:19   Dg  Chest 2 View  03/05/2015   CLINICAL DATA:  Weakness. History of sickle cell anemia, lung mass, hypertension, bronchogenic cancer of right lung, and brain cancer. Current smoker. Now complains of generalized weakness, neck pain for 3 months, burning sensation, and malodorous urine.  EXAM: CHEST  2 VIEW  COMPARISON:  02/07/2015  FINDINGS: Normal heart size and pulmonary vascularity. Left central venous catheter with tip over the low SVC unchanged in position. Emphysematous changes in the lungs. Right mid lung mass measures 5.4 x 7 cm. Size is similar to prior study. Focal infiltration in the right upper lung is probably postobstructive. Left lung is clear. Old right rib fractures. Calcified and tortuous aorta. No blunting of costophrenic angles. No pneumothorax.  IMPRESSION: Right lung mass and probable postobstructive change appears stable since prior study. Emphysematous changes in the lungs. No acute process identified.   Electronically Signed   By: Lucienne Capers M.D.   On: 03/05/2015 04:55    ASSESSMENT AND PLAN: This is a very pleasant 69 years old [place] American male with history of stage IV non-small cell lung cancer, adenocarcinoma and currently undergoing systemic chemotherapy with carboplatin and Alimta status post 6 cycles. This was discontinued secondary to disease progression. The patient is currently undergoing treatment with immunotherapy with Nivolumab status post 1 cycle and tolerating his treatment well. I recommended for him to proceed with cycle #2 today as a scheduled. The patient has lack of appetite and hypotension, I will arrange for him to receive IV hydration with normal saline today. I also encouraged the patient to increase his oral intake and he was seen by the dietitian at the [facility] for evaluation. For the oral thrush, I will start the patient on Diflucan 200 mg by mouth daily for 10 days. For smoke cessation, I strongly encouraged the patient to quit smoking and he  was seen by the thoracic navigator for smoke cessation counseling. He was advised to call immediately if he has any concerning symptoms in the interval.  The patient voices understanding of current disease status and treatment options and is in agreement with the current care plan.  All questions were answered. The patient knows to call the clinic with any problems, questions or concerns. We can certainly see the patient much sooner if necessary.  Disclaimer: This note was dictated with voice recognition software. Similar sounding words can inadvertently be transcribed and may not be corrected upon review.

## 2015-03-20 NOTE — Patient Instructions (Addendum)
[facility] Discharge Instructions for Patients Receiving Chemotherapy  Today you received the following chemotherapy agents: nivolumab (opdivo)  To help prevent nausea and vomiting after your treatment, we encourage you to take your nausea medication.   If you develop nausea and vomiting that is not controlled by your nausea medication, call the clinic.   BELOW ARE SYMPTOMS THAT SHOULD BE REPORTED IMMEDIATELY:  *FEVER GREATER THAN 100.5 F  *CHILLS WITH OR WITHOUT FEVER  NAUSEA AND VOMITING THAT IS NOT CONTROLLED WITH YOUR NAUSEA MEDICATION  *UNUSUAL SHORTNESS OF BREATH  *UNUSUAL BRUISING OR BLEEDING  TENDERNESS IN MOUTH AND THROAT WITH OR WITHOUT PRESENCE OF ULCERS  *URINARY PROBLEMS  *BOWEL PROBLEMS  UNUSUAL RASH Items with * indicate a potential emergency and should be followed up as soon as possible.  Feel free to call the clinic you have any questions or concerns. The clinic phone number is [phone].  Please show the [facility] at check-in to the Emergency Department and triage nurse.

## 2015-03-20 NOTE — Telephone Encounter (Signed)
Appointments made and avs printed for patient °

## 2015-03-27 ENCOUNTER — Emergency Department (HOSPITAL_COMMUNITY)
Admission: EM | Admit: 2015-03-27 | Discharge: 2015-03-27 | Disposition: A | Discharge status: Home / Self Care | Payer: Medicare Other | Attending: Emergency Medicine | Admitting: Emergency Medicine

## 2015-03-27 ENCOUNTER — Encounter (HOSPITAL_COMMUNITY): Payer: Self-pay

## 2015-03-27 DIAGNOSIS — Z8619 Personal history of other infectious and parasitic diseases: Secondary | ICD-10-CM | POA: Insufficient documentation

## 2015-03-27 DIAGNOSIS — I959 Hypotension, unspecified: Secondary | ICD-10-CM | POA: Insufficient documentation

## 2015-03-27 DIAGNOSIS — Z85841 Personal history of malignant neoplasm of brain: Secondary | ICD-10-CM | POA: Diagnosis not present

## 2015-03-27 DIAGNOSIS — Z720 Tobacco use: Secondary | ICD-10-CM | POA: Diagnosis not present

## 2015-03-27 DIAGNOSIS — K219 Gastro-esophageal reflux disease without esophagitis: Secondary | ICD-10-CM | POA: Insufficient documentation

## 2015-03-27 DIAGNOSIS — E876 Hypokalemia: Secondary | ICD-10-CM | POA: Diagnosis not present

## 2015-03-27 DIAGNOSIS — Z85118 Personal history of other malignant neoplasm of bronchus and lung: Secondary | ICD-10-CM | POA: Insufficient documentation

## 2015-03-27 DIAGNOSIS — D571 Sickle-cell disease without crisis: Secondary | ICD-10-CM | POA: Diagnosis not present

## 2015-03-27 DIAGNOSIS — I10 Essential (primary) hypertension: Secondary | ICD-10-CM | POA: Diagnosis not present

## 2015-03-27 LAB — COMPREHENSIVE METABOLIC PANEL
ALBUMIN: 3.3 g/dL — AB (ref 3.5–5.0)
ALK PHOS: 88 U/L (ref 38–126)
ALT: 11 U/L — AB (ref 17–63)
AST: 24 U/L (ref 15–41)
Anion gap: 10 (ref 5–15)
BILIRUBIN TOTAL: 0.1 mg/dL — AB (ref 0.3–1.2)
BUN: 9 mg/dL (ref 6–20)
CHLORIDE: 100 mmol/L — AB (ref 101–111)
CO2: 27 mmol/L (ref 22–32)
Calcium: 10 mg/dL (ref 8.9–10.3)
Creatinine, Ser: 0.58 mg/dL — ABNORMAL LOW (ref 0.61–1.24)
Glucose, Bld: 126 mg/dL — ABNORMAL HIGH (ref 65–99)
POTASSIUM: 2.8 mmol/L — AB (ref 3.5–5.1)
Sodium: 137 mmol/L (ref 135–145)
TOTAL PROTEIN: 7.6 g/dL (ref 6.5–8.1)

## 2015-03-27 LAB — URINALYSIS, ROUTINE W REFLEX MICROSCOPIC
Glucose, UA: NEGATIVE mg/dL
Hgb urine dipstick: NEGATIVE
Ketones, ur: NEGATIVE mg/dL
Leukocytes, UA: NEGATIVE
Nitrite: NEGATIVE
Protein, ur: NEGATIVE mg/dL
SPECIFIC GRAVITY, URINE: 1.024 (ref 1.005–1.030)
UROBILINOGEN UA: 1 mg/dL (ref 0.0–1.0)
pH: 5.5 (ref 5.0–8.0)

## 2015-03-27 LAB — CBC WITH DIFFERENTIAL/PLATELET
BASOS ABS: 0 10*3/uL (ref 0.0–0.1)
Basophils Relative: 0 % (ref 0–1)
EOS ABS: 0.1 10*3/uL (ref 0.0–0.7)
EOS PCT: 2 % (ref 0–5)
HEMATOCRIT: 35.1 % — AB (ref 39.0–52.0)
HEMOGLOBIN: 11.7 g/dL — AB (ref 13.0–17.0)
Lymphocytes Relative: 17 % (ref 12–46)
Lymphs Abs: 0.9 10*3/uL (ref 0.7–4.0)
MCH: 25.9 pg — ABNORMAL LOW (ref 26.0–34.0)
MCHC: 33.3 g/dL (ref 30.0–36.0)
MCV: 77.7 fL — AB (ref 78.0–100.0)
MONO ABS: 0.4 10*3/uL (ref 0.1–1.0)
MONOS PCT: 7 % (ref 3–12)
NEUTROS ABS: 3.9 10*3/uL (ref 1.7–7.7)
NEUTROS PCT: 74 % (ref 43–77)
PLATELETS: 286 10*3/uL (ref 150–400)
RBC: 4.52 MIL/uL (ref 4.22–5.81)
RDW: 18.2 % — ABNORMAL HIGH (ref 11.5–15.5)
WBC: 5.2 10*3/uL (ref 4.0–10.5)

## 2015-03-27 LAB — CBG MONITORING, ED: Glucose-Capillary: 109 mg/dL — ABNORMAL HIGH (ref 65–99)

## 2015-03-27 LAB — TROPONIN I: Troponin I: <0.03 ng/mL (ref <0.031)

## 2015-03-27 MED ORDER — POTASSIUM CHLORIDE CRYS ER 20 MEQ PO TBCR: 40.0000 meq | EXTENDED_RELEASE_TABLET | Freq: Once | ORAL | Status: DC

## 2015-03-27 MED ORDER — POTASSIUM CHLORIDE CRYS ER 20 MEQ PO TBCR
40.0000 meq | EXTENDED_RELEASE_TABLET | Freq: Once | ORAL | Status: AC
  Administered 2015-03-27: 40 meq via ORAL
  Filled 2015-03-27: qty 2

## 2015-03-27 NOTE — ED Notes (Signed)
Bed: WA06 Expected date:  Expected time:  Means of arrival:  Comments: Triage 3

## 2015-03-27 NOTE — ED Notes (Signed)
Patient stated a family member took his blood pressure and told him it was low.

## 2015-03-27 NOTE — ED Provider Notes (Signed)
CSN: [id number]     Arrival date & time 03/27/15  1842 History   First MD Initiated Contact with Patient 03/27/15 1930     Chief Complaint  Patient presents with  . Hypotension     (Consider location/radiation/quality/duration/timing/severity/associated sxs/prior Treatment) HPI The patient has lightheadedness with standing. He reports this is been happening since he got his cancer diagnosis. He reports that he stands slowly he can stand upright. He denies he's had actual loss of consciousness. Patient does not have associated pain at this time. He reports he always has difficulty taking in enough fluids and food. He however has not had recent diarrhea or vomiting  or fever. Past Medical History  Diagnosis Date  . GERD (gastroesophageal reflux disease)   . Headache(784.0)   . Sickle cell anemia   . Lung mass   . Hypertension   . 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 No past surgeries    . Portacath placement Left 09/01/2014    Procedure: INSERTION PORT-A-CATH;  Surgeon: Melrose Nakayama, MD;  Location: [facility] OR;  Service: Thoracic;  Laterality: Left;   Family History  Problem Relation Age of Onset  . Cancer Mother   . Stroke Father    History  Substance Use Topics  . Smoking status: Current Every Day Smoker -- 0.25 packs/day for 40 years    Types: Cigarettes  . Smokeless tobacco: Never Used  . Alcohol Use: 0.0 oz/week    0 Standard drinks or equivalent per week     Comment: social    Review of Systems 10 Systems reviewed and are negative for acute change except as noted in the HPI.    Allergies  Review of patient's allergies indicates no known allergies.  Home Medications   Prior to Admission medications   Medication Sig Start Date End Date Taking? Authorizing Provider  acetaminophen (TYLENOL) 325 MG tablet Take 2 tablets (650 mg total) by  mouth every 6 (six) hours as needed for mild pain (or Fever >/= 101). 03/07/15  Yes Robbie Lis, MD  alum & mag hydroxide-simeth (MAALOX/MYLANTA) 200-200-20 MG/5ML suspension Take 30 mLs by mouth every 6 (six) hours as needed for indigestion or heartburn (dyspepsia). 03/07/15  Yes Robbie Lis, MD  cholecalciferol (VITAMIN D) 1000 UNITS tablet Take 1,000 Units by mouth once a week.    Yes Historical Provider, MD  dexamethasone (DECADRON) 4 MG tablet Take 4 mg by mouth 2 (two) times daily. Take 1 tablet the day before teh day of and the day after chemo. 02/16/15  Yes Historical Provider, MD  feeding supplement, ENSURE ENLIVE, (ENSURE ENLIVE) LIQD Take 237 mLs by mouth 3 (three) times daily between meals. Patient taking differently: Take 237 mLs by mouth 2 (two) times a week.  03/07/15  Yes Robbie Lis, MD  fluconazole (DIFLUCAN) 200 MG tablet Take 1 tablet (200 mg total) by mouth daily. 03/20/15  Yes Curt Bears, MD  folic acid (FOLVITE) 1 MG tablet Take 1 tablet (1 mg total) by mouth daily. 08/11/14  Yes Modena Jansky, MD  lidocaine-prilocaine (EMLA) cream Apply 1 application topically as needed. Patient taking differently: Apply 1 application topically as needed (port access).  09/02/14  Yes Curt Bears, MD  Multiple Vitamin (MULTIVITAMIN WITH MINERALS) TABS tablet Take 1 tablet by mouth daily. Patient taking differently: Take 1 tablet  by mouth once a week.  08/11/14  Yes Modena Jansky, MD  ondansetron (ZOFRAN) 4 MG tablet Take 1 tablet (4 mg total) by mouth every 6 (six) hours as needed for nausea. 03/07/15  Yes Robbie Lis, MD  oxyCODONE (OXY IR/ROXICODONE) 5 MG immediate release tablet Take 1-2 tablets (5-10 mg total) by mouth every 4 (four) hours as needed for moderate pain. 03/20/15  Yes Adrena E Johnson, PA-C  potassium chloride SA (K-DUR,KLOR-CON) 20 MEQ tablet Take 1 tablet (20 mEq total) by mouth 2 (two) times daily. 01/02/15  Yes Curt Bears, MD  prochlorperazine (COMPAZINE)  10 MG tablet Take 1 tablet (10 mg total) by mouth every 6 (six) hours as needed for nausea or vomiting. 01/20/15  Yes Carlton Adam, PA-C  thiamine 100 MG tablet Take 1 tablet (100 mg total) by mouth daily. Patient taking differently: Take 100 mg by mouth once a week.  08/11/14  Yes Modena Jansky, MD  potassium chloride SA (K-DUR,KLOR-CON) 20 MEQ tablet Take 2 tablets (40 mEq total) by mouth once. 03/27/15   Charlesetta Shanks, MD   BP 109/67 mmHg  Pulse 79  Temp(Src) 98 F (36.7 C) (Oral)  Resp 17  Ht '5\' 11"'$  (1.803 m)  Wt 120 lb (54.432 kg)  BMI 16.74 kg/m2  SpO2 100% Physical Exam  Constitutional: He is oriented to person, place, and time.  Patient is cachectic in appearance. He is nontoxic and alert. He has no respiratory distress.  HENT:  Head: Normocephalic and atraumatic.  Eyes: EOM are normal. Pupils are equal, round, and reactive to light.  Cardiovascular: Normal rate, regular rhythm, normal heart sounds and intact distal pulses.   Pulmonary/Chest: Effort normal.  Breath sounds are soft and symmetric without gross wheeze rhonchi or rail.  Abdominal: Soft. Bowel sounds are normal. He exhibits no distension. There is no tenderness.  Musculoskeletal: Normal range of motion. He exhibits no edema or tenderness.  Neurological: He is alert and oriented to person, place, and time. He exhibits normal muscle tone. Coordination normal.  Skin: Skin is warm and dry.  Psychiatric: He has a normal mood and affect.    ED Course  Procedures (including critical care time) Labs Review Labs Reviewed  COMPREHENSIVE METABOLIC PANEL - Abnormal; Notable for the following:    Potassium 2.8 (*)    Chloride 100 (*)    Glucose, Bld 126 (*)    Creatinine, Ser 0.58 (*)    Albumin 3.3 (*)    ALT 11 (*)    Total Bilirubin 0.1 (*)    All other components within normal limits  CBC WITH DIFFERENTIAL/PLATELET - Abnormal; Notable for the following:    Hemoglobin 11.7 (*)    HCT 35.1 (*)    MCV 77.7  (*)    MCH 25.9 (*)    RDW 18.2 (*)    All other components within normal limits  URINALYSIS, ROUTINE W REFLEX MICROSCOPIC (NOT AT [facility]) - Abnormal; Notable for the following:    Color, Urine AMBER (*)    Bilirubin Urine SMALL (*)    All other components within normal limits  CBG MONITORING, ED - Abnormal; Notable for the following:    Glucose-Capillary 109 (*)    All other components within normal limits  TROPONIN I    Imaging Review No results found.   EKG Interpretation   Date/Time:  Monday March 27 2015 19:51:21 EDT Ventricular Rate:  82 PR Interval:  143 QRS Duration: 102 QT Interval:  401 QTC  Calculation: 468 R Axis:   -10 Text Interpretation:  Sinus rhythm Consider right atrial enlargement  Borderline ST elevation, lateral leads agree. no significant change.  Confirmed by Johnney Killian, MD, Jeannie Done [phone]) on 03/27/2015 8:01:35 PM      MDM   Final diagnoses:  Hypotension, unspecified hypotension type  Hypokalemia   Patient has had problems with hypotension reportedly since his cancer diagnosis. At this time he is stable without evidence of anemia or acute dehydration\renal failure. Patient does have hypokalemia which may be contributory to some fatigue. At this point however he do feel he is safe for discharge without reports of any syncopal episodes. Patient reports although he gets to feeling lightheaded when he stands, he does not pass out and this resolves within a few minutes. Patient is advised to continue to work with his oncologist and family doctor for further diagnostic evaluation regarding hypokalemia and hypotension. Patient shows no signs of being septic, no signs of cardiac ischemic disease, no signs of acute neurologic dysfunction.    Charlesetta Shanks, MD 03/27/15 2325

## 2015-03-27 NOTE — ED Notes (Signed)
Bed: WA03 Expected date:  Expected time:  Means of arrival:  Comments: hold

## 2015-03-27 NOTE — Discharge Instructions (Signed)
Hypokalemia Hypokalemia means that the amount of potassium in the blood is lower than normal.Potassium is a chemical, called an electrolyte, that helps regulate the amount of fluid in the body. It also stimulates muscle contraction and helps nerves function properly.Most of the body's potassium is inside of cells, and only a very small amount is in the blood. Because the amount in the blood is so small, minor changes can be life-threatening. CAUSES  Antibiotics.  Diarrhea or vomiting.  Using laxatives too much, which can cause diarrhea.  Chronic kidney disease.  Water pills (diuretics).  Eating disorders (bulimia).  Low magnesium level.  Sweating a lot. SIGNS AND SYMPTOMS  Weakness.  Constipation.  Fatigue.  Muscle cramps.  Mental confusion.  Skipped heartbeats or irregular heartbeat (palpitations).  Tingling or numbness. DIAGNOSIS  Your health care provider can diagnose hypokalemia with blood tests. In addition to checking your potassium level, your health care provider may also check other lab tests. TREATMENT Hypokalemia can be treated with potassium supplements taken by mouth or adjustments in your current medicines. If your potassium level is very low, you may need to get potassium through a vein (IV) and be monitored in the hospital. A diet high in potassium is also helpful. Foods high in potassium are:  Nuts, such as peanuts and pistachios.  Seeds, such as sunflower seeds and pumpkin seeds.  Peas, lentils, and lima beans.  Whole grain and bran cereals and breads.  Fresh fruit and vegetables, such as apricots, avocado, bananas, cantaloupe, kiwi, oranges, tomatoes, asparagus, and potatoes.  Orange and tomato juices.  Red meats.  Fruit yogurt. HOME CARE INSTRUCTIONS  Take all medicines as prescribed by your health care provider.  Maintain a healthy diet by including nutritious food, such as fruits, vegetables, nuts, whole grains, and lean meats.  If  you are taking a laxative, be sure to follow the directions on the label. SEEK MEDICAL CARE IF:  Your weakness gets worse.  You feel your heart pounding or racing.  You are vomiting or having diarrhea.  You are diabetic and having trouble keeping your blood glucose in the normal range. SEEK IMMEDIATE MEDICAL CARE IF:  You have chest pain, shortness of breath, or dizziness.  You are vomiting or having diarrhea for more than 2 days.  You faint. MAKE SURE YOU:   Understand these instructions.  Will watch your condition.  Will get help right away if you are not doing well or get worse. Document Released: 08/05/2005 Document Revised: 05/26/2013 Document Reviewed: 02/05/2013 [facility] Patient Information 2015 [place], [place]. This information is not intended to replace advice given to you by your health care provider. Make sure you discuss any questions you have with your health care provider.  Hypotension As your heart beats, it forces blood through your arteries. This force is your blood pressure. If your blood pressure is too low for you to go about your normal activities or to support the organs of your body, you have hypotension. Hypotension is also referred to as low blood pressure. When your blood pressure becomes too low, you may not get enough blood to your brain. As a result, you may feel weak, feel lightheaded, or develop a rapid heart rate. In a more severe case, you may faint. CAUSES Various conditions can cause hypotension. These include:  Blood loss.  Dehydration.  Heart or endocrine problems.  Pregnancy.  Severe infection.  Not having a well-balanced diet filled with needed nutrients.  Severe allergic reactions (anaphylaxis). Some medicines, such as  blood pressure medicine or water pills (diuretics), may lower your blood pressure below normal. Sometimes taking too much medicine or taking medicine not as directed can cause hypotension. TREATMENT  Hospitalization  is sometimes required for hypotension if fluid or blood replacement is needed, if time is needed for medicines to wear off, or if further monitoring is needed. Treatment might include changing your diet, changing your medicines (including medicines aimed at raising your blood pressure), and use of support stockings. HOME CARE INSTRUCTIONS   Drink enough fluids to keep your urine clear or pale yellow.  Take your medicines as directed by your health care provider.  Get up slowly from reclining or sitting positions. This gives your blood pressure a chance to adjust.  Wear support stockings as directed by your health care provider.  Maintain a healthy diet by including nutritious food, such as fruits, vegetables, nuts, whole grains, and lean meats. SEEK MEDICAL CARE IF:  You have vomiting or diarrhea.  You have a fever for more than 2-3 days.  You feel more thirsty than usual.  You feel weak and tired. SEEK IMMEDIATE MEDICAL CARE IF:   You have chest pain or a fast or irregular heartbeat.  You have a loss of feeling in some part of your body, or you lose movement in your arms or legs.  You have trouble speaking.  You become sweaty or feel lightheaded.  You faint. MAKE SURE YOU:   Understand these instructions.  Will watch your condition.  Will get help right away if you are not doing well or get worse. Document Released: 08/05/2005 Document Revised: 05/26/2013 Document Reviewed: 02/05/2013 [facility] Patient Information 2015 [place], [place]. This information is not intended to replace advice given to you by your health care provider. Make sure you discuss any questions you have with your health care provider.

## 2015-03-27 NOTE — ED Notes (Signed)
Pt is aware urine sample is needed. 

## 2015-03-29 ENCOUNTER — Telehealth (HOSPITAL_COMMUNITY): Payer: Self-pay

## 2015-03-30 ENCOUNTER — Other Ambulatory Visit: Payer: Self-pay | Admitting: Physician Assistant

## 2015-03-31 ENCOUNTER — Other Ambulatory Visit: Payer: Self-pay | Admitting: Physician Assistant

## 2015-03-31 DIAGNOSIS — C3491 Malignant neoplasm of unspecified part of right bronchus or lung: Secondary | ICD-10-CM

## 2015-04-03 ENCOUNTER — Ambulatory Visit (HOSPITAL_BASED_OUTPATIENT_CLINIC_OR_DEPARTMENT_OTHER): Payer: Medicare Other | Admitting: Nurse Practitioner

## 2015-04-03 ENCOUNTER — Other Ambulatory Visit (HOSPITAL_BASED_OUTPATIENT_CLINIC_OR_DEPARTMENT_OTHER): Payer: Medicare Other

## 2015-04-03 ENCOUNTER — Telehealth: Payer: Self-pay | Admitting: Nurse Practitioner

## 2015-04-03 ENCOUNTER — Ambulatory Visit (HOSPITAL_BASED_OUTPATIENT_CLINIC_OR_DEPARTMENT_OTHER): Payer: Medicare Other

## 2015-04-03 ENCOUNTER — Ambulatory Visit: Payer: Medicare Other | Admitting: Nutrition

## 2015-04-03 VITALS — BP 81/56 | HR 118 | Temp 97.9°F | Resp 18 | Ht 71.0 in | Wt 114.9 lb

## 2015-04-03 VITALS — BP 92/60 | HR 97 | Temp 96.9°F | Resp 18

## 2015-04-03 DIAGNOSIS — C3411 Malignant neoplasm of upper lobe, right bronchus or lung: Secondary | ICD-10-CM

## 2015-04-03 DIAGNOSIS — B370 Candidal stomatitis: Secondary | ICD-10-CM

## 2015-04-03 DIAGNOSIS — C7951 Secondary malignant neoplasm of bone: Secondary | ICD-10-CM

## 2015-04-03 DIAGNOSIS — C7931 Secondary malignant neoplasm of brain: Secondary | ICD-10-CM

## 2015-04-03 DIAGNOSIS — I959 Hypotension, unspecified: Secondary | ICD-10-CM

## 2015-04-03 DIAGNOSIS — E860 Dehydration: Secondary | ICD-10-CM

## 2015-04-03 DIAGNOSIS — E8809 Other disorders of plasma-protein metabolism, not elsewhere classified: Secondary | ICD-10-CM | POA: Diagnosis not present

## 2015-04-03 DIAGNOSIS — R21 Rash and other nonspecific skin eruption: Secondary | ICD-10-CM

## 2015-04-03 DIAGNOSIS — Z95828 Presence of other vascular implants and grafts: Secondary | ICD-10-CM

## 2015-04-03 DIAGNOSIS — Z79899 Other long term (current) drug therapy: Secondary | ICD-10-CM

## 2015-04-03 DIAGNOSIS — R630 Anorexia: Secondary | ICD-10-CM | POA: Diagnosis not present

## 2015-04-03 DIAGNOSIS — R634 Abnormal weight loss: Secondary | ICD-10-CM

## 2015-04-03 DIAGNOSIS — C3491 Malignant neoplasm of unspecified part of right bronchus or lung: Secondary | ICD-10-CM

## 2015-04-03 LAB — COMPREHENSIVE METABOLIC PANEL (CC13)
ALT: 8 U/L (ref 0–55)
AST: 15 U/L (ref 5–34)
Albumin: 3 g/dL — ABNORMAL LOW (ref 3.5–5.0)
Alkaline Phosphatase: 83 U/L (ref 40–150)
Anion Gap: 11 mEq/L (ref 3–11)
BUN: 5.6 mg/dL — AB (ref 7.0–26.0)
CHLORIDE: 108 meq/L (ref 98–109)
CO2: 23 mEq/L (ref 22–29)
CREATININE: 0.7 mg/dL (ref 0.7–1.3)
Calcium: 9.9 mg/dL (ref 8.4–10.4)
EGFR: >90 mL/min/{1.73_m2} (ref >90)
Glucose: 102 mg/dl (ref 70–140)
Potassium: 4 mEq/L (ref 3.5–5.1)
SODIUM: 142 meq/L (ref 136–145)
Total Bilirubin: 0.3 mg/dL (ref 0.20–1.20)
Total Protein: 6.8 g/dL (ref 6.4–8.3)

## 2015-04-03 LAB — CBC WITH DIFFERENTIAL/PLATELET
BASO%: 1.6 % (ref 0.0–2.0)
Basophils Absolute: 0.1 10*3/uL (ref 0.0–0.1)
EOS%: 18.9 % — AB (ref 0.0–7.0)
Eosinophils Absolute: 0.8 10*3/uL — ABNORMAL HIGH (ref 0.0–0.5)
HCT: 30 % — ABNORMAL LOW (ref 38.4–49.9)
HGB: 9.8 g/dL — ABNORMAL LOW (ref 13.0–17.1)
LYMPH%: 22.9 % (ref 14.0–49.0)
MCH: 25.9 pg — ABNORMAL LOW (ref 27.2–33.4)
MCHC: 32.6 g/dL (ref 32.0–36.0)
MCV: 79.3 fL (ref 79.3–98.0)
MONO#: 0.4 10*3/uL (ref 0.1–0.9)
MONO%: 10.4 % (ref 0.0–14.0)
NEUT#: 1.8 10*3/uL (ref 1.5–6.5)
NEUT%: 46.2 % (ref 39.0–75.0)
Platelets: 323 10*3/uL (ref 140–400)
RBC: 3.78 10*6/uL — AB (ref 4.20–5.82)
RDW: 19.3 % — ABNORMAL HIGH (ref 11.0–14.6)
WBC: 4 10*3/uL (ref 4.0–10.3)
lymph#: 0.9 10*3/uL (ref 0.9–3.3)

## 2015-04-03 LAB — TSH CHCC: TSH: 1.749 m[IU]/L (ref 0.320–4.118)

## 2015-04-03 MED ORDER — SODIUM CHLORIDE 0.9 % IV SOLN
Freq: Once | INTRAVENOUS | Status: AC
  Administered 2015-04-03: 12:00:00 via INTRAVENOUS

## 2015-04-03 MED ORDER — CEPHALEXIN 500 MG PO CAPS: 500.0000 mg | ORAL_CAPSULE | Freq: Four times a day (QID) | ORAL | Status: DC

## 2015-04-03 MED ORDER — SODIUM CHLORIDE 0.9 % IJ SOLN
10.0000 mL | INTRAMUSCULAR | Status: DC | PRN
  Administered 2015-04-03: 10 mL via INTRAVENOUS
  Filled 2015-04-03: qty 10

## 2015-04-03 MED ORDER — HEPARIN SOD (PORK) LOCK FLUSH 100 UNIT/ML IV SOLN
500.0000 [IU] | Freq: Once | INTRAVENOUS | Status: AC
  Administered 2015-04-03: 500 [IU] via INTRAVENOUS
  Filled 2015-04-03: qty 5

## 2015-04-03 NOTE — Progress Notes (Signed)
Nutrition follow-up completed with patient during IV fluids. Last nutrition visit with patient was April 2016 when patient was approximately 161.8 pounds. Patient currently weighs 114.9 pounds with BMI of 16.03 and is underweight. He reports decreased appetite and increased pain. Reports occasional diarrhea but does not take any medication for this. He drinks one ensure a day but refuses any more. Patient denies nutrition questions or needs.  Severe malnutrition in the context of chronic illness secondary to 30% weight loss over 4 months, less than 75% energy intake for greater than one month, and severe depletion of body fat and muscle mass on physical exam.  Nutrition diagnosis: Malnutrition related to lung cancer as evidenced by 30% weight loss over 4 months.  Intervention: Provided support and encouragement for patient to increase oral intake in small frequent, high-calorie, high-protein meals. Encouraged patient to increase Ensure Plus, however, patient is not interested. Offered coupons and information but patient declines.  Monitoring, evaluation, goals: Patient will tolerate adequate calories and protein for improved quality-of-life.  No follow-up is scheduled.  Patient has contact information if needed.

## 2015-04-03 NOTE — Progress Notes (Signed)
No chemo today - 1 liter IV Fluids

## 2015-04-03 NOTE — Telephone Encounter (Signed)
Unable to reach patient, phone keep ringing. Mailed calendar.

## 2015-04-04 ENCOUNTER — Encounter: Payer: Self-pay | Admitting: Nurse Practitioner

## 2015-04-04 DIAGNOSIS — R21 Rash and other nonspecific skin eruption: Secondary | ICD-10-CM | POA: Insufficient documentation

## 2015-04-04 DIAGNOSIS — R630 Anorexia: Secondary | ICD-10-CM | POA: Insufficient documentation

## 2015-04-04 DIAGNOSIS — E8809 Other disorders of plasma-protein metabolism, not elsewhere classified: Secondary | ICD-10-CM | POA: Insufficient documentation

## 2015-04-04 DIAGNOSIS — B370 Candidal stomatitis: Secondary | ICD-10-CM | POA: Insufficient documentation

## 2015-04-04 DIAGNOSIS — R634 Abnormal weight loss: Secondary | ICD-10-CM | POA: Insufficient documentation

## 2015-04-04 DIAGNOSIS — I959 Hypotension, unspecified: Secondary | ICD-10-CM | POA: Insufficient documentation

## 2015-04-04 NOTE — Progress Notes (Signed)
[facility]EMENT [facility]   HPI: Brendan Hooper 69 y.o. male diagnosed with lung cancer; with both brain and bone metastasis.  Currently undergoing Nivolumab immunotherapy.   Patient presents to the [facility] today to receive his third cycle of Nivolumab immunotherapy.  WBC 4.0, ANC 1.8, hemoglobin has decreased from 11.7 down to 9.8, and platelet count 323.  Patient continues with very poor appetite, minimal oral intake, and dehydration.  He is also another 2-1/2 pounds since his last weight check.  He has also developed blisters/rash to various areas of his body since he was last here.  Rash/blisters could very well be secondary to patient's immunotherapy infusion.  Described Keflex and a biotics for treatment of the rash; and hold immunotherapy this week.  Patient will be rescheduled for labs, visit, and immunotherapy infusion in one week.    HPI  ROS  Past Medical History  Diagnosis Date  . GERD (gastroesophageal reflux disease)   . Headache(784.0)   . Sickle cell anemia   . Lung mass   . Hypertension   . 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 No past surgeries    . Portacath placement Left 09/01/2014    Procedure: INSERTION PORT-A-CATH;  Surgeon: Melrose Nakayama, MD;  Location: [facility];  Service: Thoracic;  Laterality: Left;    has Syncope; Alcohol abuse; HTN (hypertension); Current smoker; Postobstructive pneumonia; CAP (community acquired pneumonia); Lung mass; Abscess of right groin; Enlargement of lymph node; Protein-calorie malnutrition, severe; Hypokalemia; Bronchogenic cancer of right lung; Brain metastases; Neck pain; Hypomagnesemia; Dehydration; FTT (failure to thrive) in adult; Generalized weakness; Weakness; Hypotension arterial; Oral thrush; Hypotension; Thrush; Anorexia; Weight loss; Hypoalbuminemia; and Rash on his problem list.     has No Known Allergies.    Medication List       This list is accurate as of: 04/03/15 11:59 PM.  Always use your most recent med list.               acetaminophen 325 MG tablet  Commonly known as:  TYLENOL  Take 2 tablets (650 mg total) by mouth every 6 (six) hours as needed for mild pain (or Fever >/= 101).     alum & mag hydroxide-simeth 200-200-20 MG/5ML suspension  Commonly known as:  MAALOX/MYLANTA  Take 30 mLs by mouth every 6 (six) hours as needed for indigestion or heartburn (dyspepsia).     cephALEXin 500 MG capsule  Commonly known as:  KEFLEX  Take 1 capsule (500 mg total) by mouth 4 (four) times daily.     cholecalciferol 1000 UNITS tablet  Commonly known as:  VITAMIN D  Take 1,000 Units by mouth once a week.     dexamethasone 4 MG tablet  Commonly known as:  DECADRON  Take 4 mg by mouth 2 (two) times daily. Take 1 tablet the day before teh day of and the day after chemo.     feeding supplement (ENSURE ENLIVE) Liqd  Take 237 mLs by mouth 3 (three) times daily between meals.     fluconazole 200 MG tablet  Commonly known as:  DIFLUCAN  Take 1 tablet (200 mg total) by mouth daily.     folic acid 1 MG tablet  Commonly known as:  FOLVITE  Take 1 tablet (1 mg total) by mouth daily.     lidocaine-prilocaine cream  Commonly known as:  EMLA  Apply 1 application topically as needed.     multivitamin with minerals Tabs tablet  Take 1 tablet by mouth daily.     ondansetron 4 MG tablet  Commonly known as:  ZOFRAN  Take 1 tablet (4 mg total) by mouth every 6 (six) hours as needed for nausea.     oxyCODONE 5 MG immediate release tablet  Commonly known as:  Oxy IR/ROXICODONE  Take 1-2 tablets (5-10 mg total) by mouth every 4 (four) hours as needed for moderate pain.     potassium chloride SA 20 MEQ tablet  Commonly known as:  K-DUR,KLOR-CON  Take 1 tablet (20 mEq total) by mouth 2 (two) times daily.     potassium chloride SA 20 MEQ tablet  Commonly known  as:  K-DUR,KLOR-CON  Take 2 tablets (40 mEq total) by mouth once.     prochlorperazine 10 MG tablet  Commonly known as:  COMPAZINE  Take 1 tablet (10 mg total) by mouth every 6 (six) hours as needed for nausea or vomiting.     thiamine 100 MG tablet  Take 1 tablet (100 mg total) by mouth daily.         PHYSICAL EXAMINATION  Oncology Vitals 04/03/2015 04/03/2015 03/27/2015 03/27/2015 03/27/2015 03/27/2015 03/27/2015  Height - 180 cm - - - - -  Weight - 52.118 kg - - - - -  Weight (lbs) - 114 lbs 14 oz - - - - -  BMI (kg/m2) - 16.03 kg/m2 - - - - -  Temp 96.9 97.9 97.9 - - - -  Pulse 97 118 82 79 77 78 78  Resp _0 SpO2 100 100 100 100 100 100 100  BSA (m2) - 1.62 m2 - - - - -   BP Readings from Last 3 Encounters:  04/03/15 92/60  04/03/15 81/56  03/27/15 109/69    Physical Exam  Constitutional: He is oriented to person, place, and time.  Patient appears fatigued, weak, frail and very thin.  HENT:  Head: Normocephalic and atraumatic.  Minimal white coating to tongue at present time.  No new oral lesions.  Eyes: Conjunctivae and EOM are normal. Pupils are equal, round, and reactive to light. Right eye exhibits no discharge. Left eye exhibits no discharge. No scleral icterus.  Neck: Normal range of motion. Neck supple. No JVD present. No tracheal deviation present. No thyromegaly present.  Cardiovascular: Normal rate, regular rhythm, normal heart sounds and intact distal pulses.   Pulmonary/Chest: Effort normal and breath sounds normal. No respiratory distress. He has no wheezes. He has no rales. He exhibits no tenderness.  Abdominal: Soft. Bowel sounds are normal. He exhibits no distension and no mass. There is no tenderness. There is no rebound and no guarding.  Musculoskeletal: Normal range of motion. He exhibits no edema or tenderness.  Lymphadenopathy:    He has no cervical adenopathy.  Neurological: He is alert and oriented to person, place, and time.  In  wheelchair during exam.  Skin: Skin is warm and dry. Rash noted. No erythema. No pallor.  Patient has healed rash to his anterior neck and his left antecubital region.  Patient has rash with large blisters to his right antecubital in his bilateral axilla.  Left axilla blisters appear to have ruptured with some purulent discharge.  There is no surrounding erythema, edema, warmth, or tenderness.  Also, no red streaks noted.  Psychiatric: Affect normal.  Nursing note and  vitals reviewed.   LABORATORY DATA:. Appointment on 04/03/2015  Component Date Value Ref Range Status  . TSH 04/03/2015 1.749  0.320 - 4.118 m(IU)/L Final  . WBC 04/03/2015 4.0  4.0 - 10.3 10e3/uL Final  . NEUT# 04/03/2015 1.8  1.5 - 6.5 10e3/uL Final  . HGB 04/03/2015 9.8* 13.0 - 17.1 g/dL Final  . HCT 04/03/2015 30.0* 38.4 - 49.9 % Final  . Platelets 04/03/2015 323  140 - 400 10e3/uL Final  . MCV 04/03/2015 79.3  79.3 - 98.0 fL Final  . MCH 04/03/2015 25.9* 27.2 - 33.4 pg Final  . MCHC 04/03/2015 32.6  32.0 - 36.0 g/dL Final  . RBC 04/03/2015 3.78* 4.20 - 5.82 10e6/uL Final  . RDW 04/03/2015 19.3* 11.0 - 14.6 % Final  . lymph# 04/03/2015 0.9  0.9 - 3.3 10e3/uL Final  . MONO# 04/03/2015 0.4  0.1 - 0.9 10e3/uL Final  . Eosinophils Absolute 04/03/2015 0.8* 0.0 - 0.5 10e3/uL Final  . Basophils Absolute 04/03/2015 0.1  0.0 - 0.1 10e3/uL Final  . NEUT% 04/03/2015 46.2  39.0 - 75.0 % Final  . LYMPH% 04/03/2015 22.9  14.0 - 49.0 % Final  . MONO% 04/03/2015 10.4  0.0 - 14.0 % Final  . EOS% 04/03/2015 18.9* 0.0 - 7.0 % Final  . BASO% 04/03/2015 1.6  0.0 - 2.0 % Final  . Sodium 04/03/2015 142  136 - 145 mEq/L Final  . Potassium 04/03/2015 4.0  3.5 - 5.1 mEq/L Final  . Chloride 04/03/2015 108  98 - 109 mEq/L Final  . CO2 04/03/2015 23  22 - 29 mEq/L Final  . Glucose 04/03/2015 102  70 - 140 mg/dl Final  . BUN 04/03/2015 5.6* 7.0 - 26.0 mg/dL Final  . Creatinine 04/03/2015 0.7  0.7 - 1.3 mg/dL Final  . Total Bilirubin  04/03/2015 0.30  0.20 - 1.20 mg/dL Final  . Alkaline Phosphatase 04/03/2015 83  40 - 150 U/L Final  . AST 04/03/2015 15  5 - 34 U/L Final  . ALT 04/03/2015 8  0 - 55 U/L Final  . Total Protein 04/03/2015 6.8  6.4 - 8.3 g/dL Final  . Albumin 04/03/2015 3.0* 3.5 - 5.0 g/dL Final  . Calcium 04/03/2015 9.9  8.4 - 10.4 mg/dL Final  . Anion Gap 04/03/2015 11  3 - 11 mEq/L Final  . EGFR 04/03/2015 >90  >90 ml/min/1.73 m2 Final   eGFR is calculated using the CKD-EPI Creatinine Equation (2009)     RADIOGRAPHIC STUDIES: No results found.  ASSESSMENT/PLAN:    Anorexia Patient continues with minimal appetite and poor oral intake.  Patient has lost approximately 2-1/2 pounds since his last weight check.  Patient was encouraged to push protein in his diet; and eat multiple small meals throughout the day.  Bronchogenic cancer of right lung Patient presents to the [facility] today to receive his third cycle of Nivolumab immunotherapy.  WBC 4.0, ANC 1.8, hemoglobin has decreased from 11.7 down to 9.8, and platelet count 323.  Patient continues with very poor appetite, minimal oral intake, and dehydration.  He is also another 2-1/2 pounds since his last weight check.  He has also developed blisters/rash to various areas of his body since he was last here.  Rash/blisters could very well be secondary to patient's immunotherapy infusion.  Described Keflex and a biotics for treatment of the rash; and hold immunotherapy this week.  Patient will be rescheduled for labs, visit, and immunotherapy infusion in one week.    Dehydration Patient continues  with minimal appetite and poor oral intake.  Patient has lost approximately 2-1/2 pounds since his last weight check.  Patient feels dehydrated today.  On exam patient appears fatigued, weak, very thin and frail.  Also, his lips are cracked and dry.  Patient will receive approximate 1500 mils normal saline IV fluid rehydration while the cancer Center today.   While at the cancer Center-patient was able to eat some chicken noodle soup.   Patient was encouraged to push protein in his diet; and eat multiple small meals throughout the day    Hypoalbuminemia Albumen has decreased from 3.3 down to 3.0 today.  Patient was encouraged to push protein in his diet is much as possible.  Hypotension Blood pressure on initial check your the cancer Center was 81/56 with a heart rate of 118.  Patient appears fairly dehydrated today.  Patient received approximately 1500 ML's normal saline IV fluid rehydration [facility] today; and patient's blood pressure improved to 92/60 with a heart rate of 97.  Confirm the patient does not take any blood pressure medications.  Whatsoever.  Patient was encouraged to push fluids at home is much as possible.  Rash Patient reports development of rash with blisters to various areas of his generalized body within this past week or so.  He complains of some mild pruritus with the rash.  On exam.  Patient has some healing rash to his anterior neck and his left antecubital region.  Patient has large blisters to his right antecubital and his bilateral axilla.  Some of these blisters to the left axilla region have opened; has some purulent discharge.  Patient also has an area blisters to his right upper thigh as well.  Wound culture swab obtained to the left axilla region.  Will plan on holding patient's immunotherapy infusion today; and initiated Keflex and a biotics for treatment of rash.  Thrush Patient has had some significant thrush recently; and has completed Diflucan 200 mg daily.  On exam.-Patient's tongue with minimal to no white coating at present.  Also, no new oral lesions noted.  Will continue to monitor closely.  Weight loss Patient continues with minimal appetite and poor oral intake.  Patient has lost approximately 2-1/2 pounds since his last weight check.  Patient feels dehydrated today.  On exam patient  appears fatigued, weak, very thin and frail.  Also, his lips are cracked and dry.  Patient will receive approximate 1500 mils normal saline IV fluid rehydration while the cancer Center today.  While at the cancer Center-patient was able to eat some chicken noodle soup.   Patient was encouraged to push protein in his diet; and eat multiple small meals throughout the day  Patient stated understanding of all instructions; and was in agreement with this plan of care. The patient knows to call the clinic with any problems, questions or concerns.   This was a shared visit with Dr. [name] today.  Total time spent with patient was 40 minutes;  with greater than 75 percent of that time spent in face to face counseling regarding patient's symptoms,  and coordination of care and follow up.  Disclaimer: This note was dictated with voice recognition software. Similar sounding words can inadvertently be transcribed and may not be corrected upon review.   Drue Second, NP 04/04/2015   ADDENDUM: Hematology/Oncology Attending: I had a face to face encounter with the patient. I recommended his care plan. This is a very pleasant 69 years old African-American male with metastatic non-small cell  lung cancer currently undergoing treatment with immunotherapy with Nivolumab status post 2 cycles and tolerating his treatment well. The patient had several areas of bullous skin rash on the upper extremities and in the axilla. Some of these lesions looked infected. We will start the patient on Keflex 500 mg by mouth 4 times a day for 7 days. We will also delay the start of cycle #3 until next week. For the oral thrush The patient completed a course of treatment with Diflucan 200 mg by mouth daily for 10 days. For dehydration, will arrange for the patient to receive IV fluid with normal saline today. The patient would come back for follow-up visit in one week for evaluation before resuming his treatment with  immunotherapy. He was advised to call immediately if he has any concerning symptoms in the interval.  Disclaimer: This note was dictated with voice recognition software. Similar sounding words can inadvertently be transcribed and may be missed upon review. Eilleen Kempf., MD 04/06/2015

## 2015-04-04 NOTE — Assessment & Plan Note (Signed)
Patient presents to the [facility] today to receive his third cycle of Nivolumab immunotherapy.  WBC 4.0, ANC 1.8, hemoglobin has decreased from 11.7 down to 9.8, and platelet count 323.  Patient continues with very poor appetite, minimal oral intake, and dehydration.  He is also another 2-1/2 pounds since his last weight check.  He has also developed blisters/rash to various areas of his body since he was last here.  Rash/blisters could very well be secondary to patient's immunotherapy infusion.  Described Keflex and a biotics for treatment of the rash; and hold immunotherapy this week.  Patient will be rescheduled for labs, visit, and immunotherapy infusion in one week.

## 2015-04-04 NOTE — Assessment & Plan Note (Signed)
Patient continues with minimal appetite and poor oral intake.  Patient has lost approximately 2-1/2 pounds since his last weight check.  Patient feels dehydrated today.  On exam patient appears fatigued, weak, very thin and frail.  Also, his lips are cracked and dry.  Patient will receive approximate 1500 mils normal saline IV fluid rehydration while the cancer Center today.  While at the cancer Center-patient was able to eat some chicken noodle soup.   Patient was encouraged to push protein in his diet; and eat multiple small meals throughout the day

## 2015-04-04 NOTE — Assessment & Plan Note (Addendum)
Patient continues with minimal appetite and poor oral intake.  Patient has lost approximately 2-1/2 pounds since his last weight check.  Patient feels dehydrated today.  On exam patient appears fatigued, weak, very thin and frail.  Also, his lips are cracked and dry.  Patient will receive approximate 1500 mils normal saline IV fluid rehydration while the cancer Center today.  While at the cancer Center-patient was able to eat some chicken noodle soup.   Patient was encouraged to push protein in his diet; and eat multiple small meals throughout the day

## 2015-04-04 NOTE — Assessment & Plan Note (Signed)
Blood pressure on initial check your the cancer Center was 81/56 with a heart rate of 118.  Patient appears fairly dehydrated today.  Patient received approximately 1500 ML's normal saline IV fluid rehydration [facility] today; and patient's blood pressure improved to 92/60 with a heart rate of 97.  Confirm the patient does not take any blood pressure medications.  Whatsoever.  Patient was encouraged to push fluids at home is much as possible.

## 2015-04-04 NOTE — Assessment & Plan Note (Signed)
Albumen has decreased from 3.3 down to 3.0 today.  Patient was encouraged to push protein in his diet is much as possible.

## 2015-04-04 NOTE — Assessment & Plan Note (Signed)
Patient continues with minimal appetite and poor oral intake.  Patient has lost approximately 2-1/2 pounds since his last weight check.  Patient was encouraged to push protein in his diet; and eat multiple small meals throughout the day.

## 2015-04-04 NOTE — Assessment & Plan Note (Signed)
Patient reports development of rash with blisters to various areas of his generalized body within this past week or so.  He complains of some mild pruritus with the rash.  On exam.  Patient has some healing rash to his anterior neck and his left antecubital region.  Patient has large blisters to his right antecubital and his bilateral axilla.  Some of these blisters to the left axilla region have opened; has some purulent discharge.  Patient also has an area blisters to his right upper thigh as well.  Wound culture swab obtained to the left axilla region.  Will plan on holding patient's immunotherapy infusion today; and initiated Keflex and a biotics for treatment of rash.

## 2015-04-04 NOTE — Assessment & Plan Note (Signed)
Patient has had some significant thrush recently; and has completed Diflucan 200 mg daily.  On exam.-Patient's tongue with minimal to no white coating at present.  Also, no new oral lesions noted.  Will continue to monitor closely.

## 2015-04-06 LAB — WOUND CULTURE

## 2015-04-10 ENCOUNTER — Encounter: Payer: Self-pay | Admitting: Nurse Practitioner

## 2015-04-10 ENCOUNTER — Ambulatory Visit: Payer: Medicare Other

## 2015-04-10 ENCOUNTER — Other Ambulatory Visit: Payer: Medicare Other

## 2015-04-10 ENCOUNTER — Ambulatory Visit (HOSPITAL_BASED_OUTPATIENT_CLINIC_OR_DEPARTMENT_OTHER): Payer: Medicare Other | Admitting: Nurse Practitioner

## 2015-04-10 ENCOUNTER — Other Ambulatory Visit (HOSPITAL_BASED_OUTPATIENT_CLINIC_OR_DEPARTMENT_OTHER): Payer: Medicare Other

## 2015-04-10 VITALS — BP 81/58 | HR 90 | Temp 97.5°F | Resp 18 | Ht 71.0 in | Wt 113.7 lb

## 2015-04-10 DIAGNOSIS — I959 Hypotension, unspecified: Secondary | ICD-10-CM

## 2015-04-10 DIAGNOSIS — E860 Dehydration: Secondary | ICD-10-CM | POA: Diagnosis present

## 2015-04-10 DIAGNOSIS — C3411 Malignant neoplasm of upper lobe, right bronchus or lung: Secondary | ICD-10-CM

## 2015-04-10 DIAGNOSIS — R21 Rash and other nonspecific skin eruption: Secondary | ICD-10-CM

## 2015-04-10 DIAGNOSIS — C7931 Secondary malignant neoplasm of brain: Secondary | ICD-10-CM | POA: Diagnosis not present

## 2015-04-10 DIAGNOSIS — B958 Unspecified staphylococcus as the cause of diseases classified elsewhere: Secondary | ICD-10-CM | POA: Diagnosis not present

## 2015-04-10 DIAGNOSIS — R630 Anorexia: Secondary | ICD-10-CM | POA: Diagnosis not present

## 2015-04-10 DIAGNOSIS — C7951 Secondary malignant neoplasm of bone: Secondary | ICD-10-CM | POA: Diagnosis not present

## 2015-04-10 DIAGNOSIS — R634 Abnormal weight loss: Secondary | ICD-10-CM

## 2015-04-10 DIAGNOSIS — C3491 Malignant neoplasm of unspecified part of right bronchus or lung: Secondary | ICD-10-CM

## 2015-04-10 LAB — CBC WITH DIFFERENTIAL/PLATELET
BASO%: 0.3 % (ref 0.0–2.0)
Basophils Absolute: 0 10*3/uL (ref 0.0–0.1)
EOS ABS: 0 10*3/uL (ref 0.0–0.5)
EOS%: 0.1 % (ref 0.0–7.0)
HEMATOCRIT: 38.2 % — AB (ref 38.4–49.9)
HGB: 12.3 g/dL — ABNORMAL LOW (ref 13.0–17.1)
LYMPH%: 14.6 % (ref 14.0–49.0)
MCH: 25.8 pg — ABNORMAL LOW (ref 27.2–33.4)
MCHC: 32.2 g/dL (ref 32.0–36.0)
MCV: 80.1 fL (ref 79.3–98.0)
MONO#: 0.1 10*3/uL (ref 0.1–0.9)
MONO%: 1.5 % (ref 0.0–14.0)
NEUT%: 83.5 % — ABNORMAL HIGH (ref 39.0–75.0)
NEUTROS ABS: 4 10*3/uL (ref 1.5–6.5)
PLATELETS: 394 10*3/uL (ref 140–400)
RBC: 4.77 10*6/uL (ref 4.20–5.82)
RDW: 19.7 % — ABNORMAL HIGH (ref 11.0–14.6)
WBC: 4.8 10*3/uL (ref 4.0–10.3)
lymph#: 0.7 10*3/uL — ABNORMAL LOW (ref 0.9–3.3)

## 2015-04-10 LAB — COMPREHENSIVE METABOLIC PANEL (CC13)
ALT: 10 U/L (ref 0–55)
ANION GAP: 13 meq/L — AB (ref 3–11)
AST: 13 U/L (ref 5–34)
Albumin: 3.5 g/dL (ref 3.5–5.0)
Alkaline Phosphatase: 92 U/L (ref 40–150)
BILIRUBIN TOTAL: 0.25 mg/dL (ref 0.20–1.20)
BUN: 7.7 mg/dL (ref 7.0–26.0)
CALCIUM: 10.4 mg/dL (ref 8.4–10.4)
CO2: 22 mEq/L (ref 22–29)
CREATININE: 0.7 mg/dL (ref 0.7–1.3)
Chloride: 108 mEq/L (ref 98–109)
Glucose: 131 mg/dl (ref 70–140)
Potassium: 3.9 mEq/L (ref 3.5–5.1)
Sodium: 143 mEq/L (ref 136–145)
TOTAL PROTEIN: 7.6 g/dL (ref 6.4–8.3)

## 2015-04-10 NOTE — Progress Notes (Signed)
SY[facility]TOM [facility]NA[facility]EMENT [facility]   HPI: Brendan Hooper 69 y.o. male diagnosed with lung cancer; with both brain and bone metastasis.  Currently undergoing Nivolumab immunotherapy.  Pt's third cycle of Nivolumab immunotherapy was held last week due to progressive rash and blisters to various areas of his body.  Patient also appeared dehydrated and was suffering with some hypotension.  Patient received IV fluid rehydration instead last week.  On recheck today-patient continues to appear fairly dehydrated and frail/thin.  He states that his appetite has greatly improved over this past week; but he continues with a low/stable weight.  He insist that he is both drinking and eating now fairly well.  Blood pressure on initial check in the cancer center was 81/58 with a heart rate of 90.  Patient confirmed that he doesn't take any blood pressure medications.  Patient typically runs a low blood pressure; and only somewhat response to IV fluid rehydration in the past.  Patient stated that he did not want any IV fluid rehydration today; since he is both drinking and eating much better this week.  Blood counts obtained today were essentially stable.  Patient was initiated with Keflex antibiotics last week for treatment of rash.  Culture of blisters/rash was staph positive.  Patient continues with the last few days of his antibiotics as directed.  On exam.-Most of patient's old blisters have opened and are slowly drying/healing.  Patient also has some newly formed blisters to his left lateral rib area, and to the back of his neck.  He denies any recent fevers or chills.  Advised patient to complete the antibiotics.  He was already prescribed.  Will also obtain a dermatology referral/appointment for further evaluation and management.  The plan is for the patient to hold his immunotherapy for at least 2 more weeks so the rash can be further evaluated.  Patient was advised to call/return of her directly to the  emergency department for any worsening symptoms whatsoever.    Rash    Review of Systems  Skin: Positive for rash.    Past Medical History  Diagnosis Date  . GERD (gastroesophageal reflux disease)   . Headache(784.0)   . Sickle cell anemia   . Lung mass   . Hypertension   . 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 No past surgeries    . Portacath placement Left 09/01/2014    Procedure: INSERTION PORT-A-CATH;  Surgeon: Melrose Nakayama, MD;  Location: [facility];  Service: Thoracic;  Laterality: Left;    has Syncope; Alcohol abuse; HTN (hypertension); Current smoker; Postobstructive pneumonia; CAP (community acquired pneumonia); Lung mass; Abscess of right groin; Enlargement of lymph node; Protein-calorie malnutrition, severe; Hypokalemia; Bronchogenic cancer of right lung; Brain metastases; Neck pain; Hypomagnesemia; Dehydration; FTT (failure to thrive) in adult; Generalized weakness; Weakness; Hypotension arterial; Oral thrush; Hypotension; Thrush; Anorexia; Weight loss; Hypoalbuminemia; and Rash on his problem list.    has No Known Allergies.    Medication List       This list is accurate as of: 04/10/15  6:40 PM.  Always use your most recent med list.               acetaminophen 325 MG tablet  Commonly known as:  TYLENOL  Take 2 tablets (650 mg total) by mouth every 6 (six) hours as needed for mild  pain (or Fever >/= 101).     alum & mag hydroxide-simeth 200-200-20 MG/5ML suspension  Commonly known as:  MAALOX/MYLANTA  Take 30 mLs by mouth every 6 (six) hours as needed for indigestion or heartburn (dyspepsia).     cephALEXin 500 MG capsule  Commonly known as:  KEFLEX  Take 1 capsule (500 mg total) by mouth 4 (four) times daily.     cholecalciferol 1000 UNITS tablet  Commonly known as:  VITAMIN D  Take 1,000 Units by mouth once a week.      dexamethasone 4 MG tablet  Commonly known as:  DECADRON  Take 4 mg by mouth 2 (two) times daily. Take 1 tablet the day before teh day of and the day after chemo.     feeding supplement (ENSURE ENLIVE) Liqd  Take 237 mLs by mouth 3 (three) times daily between meals.     fluconazole 200 MG tablet  Commonly known as:  DIFLUCAN  Take 1 tablet (200 mg total) by mouth daily.     folic acid 1 MG tablet  Commonly known as:  FOLVITE  Take 1 tablet (1 mg total) by mouth daily.     lidocaine-prilocaine cream  Commonly known as:  EMLA  Apply 1 application topically as needed.     multivitamin with minerals Tabs tablet  Take 1 tablet by mouth daily.     ondansetron 4 MG tablet  Commonly known as:  ZOFRAN  Take 1 tablet (4 mg total) by mouth every 6 (six) hours as needed for nausea.     oxyCODONE 5 MG immediate release tablet  Commonly known as:  Oxy IR/ROXICODONE  Take 1-2 tablets (5-10 mg total) by mouth every 4 (four) hours as needed for moderate pain.     potassium chloride SA 20 MEQ tablet  Commonly known as:  K-DUR,KLOR-CON  Take 1 tablet (20 mEq total) by mouth 2 (two) times daily.     potassium chloride SA 20 MEQ tablet  Commonly known as:  K-DUR,KLOR-CON  Take 2 tablets (40 mEq total) by mouth once.     prochlorperazine 10 MG tablet  Commonly known as:  COMPAZINE  Take 1 tablet (10 mg total) by mouth every 6 (six) hours as needed for nausea or vomiting.     thiamine 100 MG tablet  Take 1 tablet (100 mg total) by mouth daily.         PHYSICAL EXAMINATION  Oncology Vitals 04/10/2015 04/03/2015 04/03/2015 03/27/2015 03/27/2015 03/27/2015 03/27/2015  Height 180 cm - 180 cm - - - -  Weight 51.574 kg - 52.118 kg - - - -  Weight (lbs) 113 lbs 11 oz - 114 lbs 14 oz - - - -  BMI (kg/m2) 15.86 kg/m2 - 16.03 kg/m2 - - - -  Temp 97.5 96.9 97.9 97.9 - - -  Pulse 90 97 118 82 79 77 78  Resp _0 SpO2 100 100 100 100 100 100 100  BSA (m2) 1.61 m2 - 1.62 m2 - - - -   BP  Readings from Last 3 Encounters:  04/10/15 81/58  04/03/15 92/60  04/03/15 81/56    Physical Exam  Constitutional: He is oriented to person, place, and time.  Patient appears fatigued, weak, frail and very thin.  HENT:  Head: Normocephalic and atraumatic.  Minimal white coating to tongue at present time.  No new oral lesions.  Eyes: Conjunctivae and EOM are normal. Pupils are equal, round, and reactive  to light. Right eye exhibits no discharge. Left eye exhibits no discharge. No scleral icterus.  Neck: Normal range of motion. Neck supple. No JVD present. No tracheal deviation present. No thyromegaly present.  Cardiovascular: Normal rate, regular rhythm, normal heart sounds and intact distal pulses.   Pulmonary/Chest: Effort normal and breath sounds normal. No respiratory distress. He has no wheezes. He has no rales. He exhibits no tenderness.  Abdominal: Soft. Bowel sounds are normal. He exhibits no distension and no mass. There is no tenderness. There is no rebound and no guarding.  Musculoskeletal: Normal range of motion. He exhibits no edema or tenderness.  Lymphadenopathy:    He has no cervical adenopathy.  Neurological: He is alert and oriented to person, place, and time.  In wheelchair during exam.  Skin: Skin is warm and dry. Rash noted. No erythema. No pallor.  Patient has healed rash to his anterior neck and his left antecubital region.  Patient has rash with large blisters to his right antecubital in his bilateral axilla.  Left axilla blisters appear to have ruptured with some purulent discharge.  There is no surrounding erythema, edema, warmth, or tenderness.  Also, no red streaks noted.  Psychiatric: Affect normal.  Nursing note and vitals reviewed.   LABORATORY DATA:. Appointment on 04/10/2015  Component Date Value Ref Range Status  . WBC 04/10/2015 4.8  4.0 - 10.3 10e3/uL Final  . NEUT# 04/10/2015 4.0  1.5 - 6.5 10e3/uL Final  . HGB 04/10/2015 12.3* 13.0 - 17.1 g/dL  Final  . HCT 04/10/2015 38.2* 38.4 - 49.9 % Final  . Platelets 04/10/2015 394  140 - 400 10e3/uL Final  . MCV 04/10/2015 80.1  79.3 - 98.0 fL Final  . MCH 04/10/2015 25.8* 27.2 - 33.4 pg Final  . MCHC 04/10/2015 32.2  32.0 - 36.0 g/dL Final  . RBC 04/10/2015 4.77  4.20 - 5.82 10e6/uL Final  . RDW 04/10/2015 19.7* 11.0 - 14.6 % Final  . lymph# 04/10/2015 0.7* 0.9 - 3.3 10e3/uL Final  . MONO# 04/10/2015 0.1  0.1 - 0.9 10e3/uL Final  . Eosinophils Absolute 04/10/2015 0.0  0.0 - 0.5 10e3/uL Final  . Basophils Absolute 04/10/2015 0.0  0.0 - 0.1 10e3/uL Final  . NEUT% 04/10/2015 83.5* 39.0 - 75.0 % Final  . LYMPH% 04/10/2015 14.6  14.0 - 49.0 % Final  . MONO% 04/10/2015 1.5  0.0 - 14.0 % Final  . EOS% 04/10/2015 0.1  0.0 - 7.0 % Final  . BASO% 04/10/2015 0.3  0.0 - 2.0 % Final  . Sodium 04/10/2015 143  136 - 145 mEq/L Final  . Potassium 04/10/2015 3.9  3.5 - 5.1 mEq/L Final  . Chloride 04/10/2015 108  98 - 109 mEq/L Final  . CO2 04/10/2015 22  22 - 29 mEq/L Final  . Glucose 04/10/2015 131  70 - 140 mg/dl Final  . BUN 04/10/2015 7.7  7.0 - 26.0 mg/dL Final  . Creatinine 04/10/2015 0.7  0.7 - 1.3 mg/dL Final  . Total Bilirubin 04/10/2015 0.25  0.20 - 1.20 mg/dL Final  . Alkaline Phosphatase 04/10/2015 92  40 - 150 U/L Final  . AST 04/10/2015 13  5 - 34 U/L Final  . ALT 04/10/2015 10  0 - 55 U/L Final  . Total Protein 04/10/2015 7.6  6.4 - 8.3 g/dL Final  . Albumin 04/10/2015 3.5  3.5 - 5.0 g/dL Final  . Calcium 04/10/2015 10.4  8.4 - 10.4 mg/dL Final  . Anion Gap 04/10/2015 13* 3 - 11  mEq/L Final  . EGFR 04/10/2015 >90  >90 ml/min/1.73 m2 Final   eGFR is calculated using the CKD-EPI Creatinine Equation (2009)   Left axilla:   Neck:     RADIOGRAPHIC STUDIES: No results found.  ASSESSMENT/PLAN:    Anorexia Patient continues with minimal appetite and poor oral intake.  However, patient's weight remains fairly stable.  Patient was encouraged to push protein in his diet; and eat  multiple small meals throughout the day.    Bronchogenic cancer of right lung Pt's third cycle of Nivolumab immunotherapy was held last week due to progressive rash and blisters to various areas of his body.  Patient also appeared dehydrated and was suffering with some hypotension.  Patient received IV fluid rehydration instead last week.  On recheck today-patient continues to appear fairly dehydrated and frail/thin.  He states that his appetite has greatly improved over this past week; but he continues with a low/stable weight.  He insist that he is both drinking and eating now fairly well.  Blood pressure on initial check in the cancer center was 81/58 with a heart rate of 90.  Patient confirmed that he doesn't take any blood pressure medications.  Patient typically runs a low blood pressure; and only somewhat response to IV fluid rehydration in the past.  Patient stated that he did not want any IV fluid rehydration today; since he is both drinking and eating much better this week.  Blood counts obtained today were essentially stable.  Patient was initiated with Keflex antibiotics last week for treatment of rash.  Culture of blisters/rash was staph positive.  Patient continues with the last few days of his antibiotics as directed.  On exam.-Most of patient's old blisters have opened and are slowly drying/healing.  Patient also has some newly formed blisters to his left lateral rib area, and to the back of his neck.  He denies any recent fevers or chills.  Advised patient to complete the antibiotics.  He was already prescribed.  Will also obtain a dermatology referral/appointment for further evaluation and management.  The plan is for the patient to hold his immunotherapy for at least 2 more weeks so the rash can be further evaluated.  Patient was advised to call/return of her directly to the emergency department for any worsening symptoms whatsoever.            Dehydration On recheck  today-patient continues to appear fairly dehydrated and frail/thin.  He states that his appetite has greatly improved over this past week; but he continues with a low/stable weight.  He insist that he is both drinking and eating now fairly well.  Patient refused IV fluid rehydration today.  He was encouraged to continue push fluids at home is much as possible.    Hypotension    Rash Pt's third cycle of Nivolumab immunotherapy was held last week due to progressive rash and blisters to various areas of his body.  Patient also appeared dehydrated and was suffering with some hypotension.  Patient received IV fluid rehydration instead last week.  Patient was initiated with Keflex antibiotics last week for treatment of rash.  Culture of blisters/rash was staph positive.  Patient continues with the last few days of his antibiotics as directed.  On exam.-Most of patient's old blisters have opened and are slowly drying/healing.  Patient also has some newly formed blisters to his left lateral rib area, and to the back of his neck.  He denies any recent fevers or chills.  Advised patient  to complete the antibiotics.  He was already prescribed.  Will also obtain a dermatology referral/appointment for further evaluation and management.  The plan is for the patient to hold his immunotherapy for at least 2 more weeks so the rash can be further evaluated.  Patient was advised to call/return of her directly to the emergency department for any worsening symptoms whatsoever.             Weight loss While patient continues to appear frail and very thin-patient insisted that his appetite has greatly improved this past week.  He states that he is eating and drinking very well.  Patient refused IV fluid rehydration today.  His weight remained stable.     Patient stated understanding of all instructions; and was in agreement with this plan of care. The patient knows to call the clinic with any problems,  questions or concerns.   All details of today's visit were reviewed with Dr. [name].  Total time spent with patient was 25 minutes;  with greater than 75 percent of that time spent in face to face counseling regarding patient's symptoms,  and coordination of care and follow up.  Disclaimer: This note was dictated with voice recognition software. Similar sounding words can inadvertently be transcribed and may not be corrected upon review.   Drue Second, NP 04/10/2015

## 2015-04-10 NOTE — Assessment & Plan Note (Signed)
Patient continues with minimal appetite and poor oral intake.  However, patient's weight remains fairly stable.  Patient was encouraged to push protein in his diet; and eat multiple small meals throughout the day.

## 2015-04-10 NOTE — Assessment & Plan Note (Addendum)
On recheck today-patient continues to appear fairly dehydrated and frail/thin.  He states that his appetite has greatly improved over this past week; but he continues with a low/stable weight.  He insist that he is both drinking and eating now fairly well.  Patient refused IV fluid rehydration today.  He was encouraged to continue push fluids at home is much as possible.

## 2015-04-10 NOTE — Assessment & Plan Note (Signed)
Pt's third cycle of Nivolumab immunotherapy was held last week due to progressive rash and blisters to various areas of his body.  Patient also appeared dehydrated and was suffering with some hypotension.  Patient received IV fluid rehydration instead last week.  On recheck today-patient continues to appear fairly dehydrated and frail/thin.  He states that his appetite has greatly improved over this past week; but he continues with a low/stable weight.  He insist that he is both drinking and eating now fairly well.  Blood pressure on initial check in the cancer center was 81/58 with a heart rate of 90.  Patient confirmed that he doesn't take any blood pressure medications.  Patient typically runs a low blood pressure; and only somewhat response to IV fluid rehydration in the past.  Patient stated that he did not want any IV fluid rehydration today; since he is both drinking and eating much better this week.  Blood counts obtained today were essentially stable.  Patient was initiated with Keflex antibiotics last week for treatment of rash.  Culture of blisters/rash was staph positive.  Patient continues with the last few days of his antibiotics as directed.  On exam.-Most of patient's old blisters have opened and are slowly drying/healing.  Patient also has some newly formed blisters to his left lateral rib area, and to the back of his neck.  He denies any recent fevers or chills.  Advised patient to complete the antibiotics.  He was already prescribed.  Will also obtain a dermatology referral/appointment for further evaluation and management.  The plan is for the patient to hold his immunotherapy for at least 2 more weeks so the rash can be further evaluated.  Patient was advised to call/return of her directly to the emergency department for any worsening symptoms whatsoever.

## 2015-04-10 NOTE — Assessment & Plan Note (Signed)
Pt's third cycle of Nivolumab immunotherapy was held last week due to progressive rash and blisters to various areas of his body.  Patient also appeared dehydrated and was suffering with some hypotension.  Patient received IV fluid rehydration instead last week.  Patient was initiated with Keflex antibiotics last week for treatment of rash.  Culture of blisters/rash was staph positive.  Patient continues with the last few days of his antibiotics as directed.  On exam.-Most of patient's old blisters have opened and are slowly drying/healing.  Patient also has some newly formed blisters to his left lateral rib area, and to the back of his neck.  He denies any recent fevers or chills.  Advised patient to complete the antibiotics.  He was already prescribed.  Will also obtain a dermatology referral/appointment for further evaluation and management.  The plan is for the patient to hold his immunotherapy for at least 2 more weeks so the rash can be further evaluated.  Patient was advised to call/return of her directly to the emergency department for any worsening symptoms whatsoever.

## 2015-04-10 NOTE — Assessment & Plan Note (Signed)
While patient continues to appear frail and very thin-patient insisted that his appetite has greatly improved this past week.  He states that he is eating and drinking very well.  Patient refused IV fluid rehydration today.  His weight remained stable.

## 2015-04-11 ENCOUNTER — Telehealth: Payer: Self-pay | Admitting: Nurse Practitioner

## 2015-04-11 NOTE — Telephone Encounter (Signed)
Unable to reach patient. Mailed calendar for 09/06.

## 2015-04-21 ENCOUNTER — Other Ambulatory Visit: Payer: Self-pay | Admitting: *Deleted

## 2015-04-25 ENCOUNTER — Ambulatory Visit (HOSPITAL_BASED_OUTPATIENT_CLINIC_OR_DEPARTMENT_OTHER): Payer: Medicare Other | Admitting: Nurse Practitioner

## 2015-04-25 ENCOUNTER — Other Ambulatory Visit (HOSPITAL_BASED_OUTPATIENT_CLINIC_OR_DEPARTMENT_OTHER): Payer: Medicare Other

## 2015-04-25 ENCOUNTER — Ambulatory Visit (HOSPITAL_BASED_OUTPATIENT_CLINIC_OR_DEPARTMENT_OTHER): Payer: Medicare Other

## 2015-04-25 VITALS — BP 81/54 | HR 94 | Temp 98.1°F | Resp 18 | Ht 71.0 in | Wt 119.6 lb

## 2015-04-25 VITALS — BP 86/52 | HR 93

## 2015-04-25 DIAGNOSIS — C7951 Secondary malignant neoplasm of bone: Secondary | ICD-10-CM | POA: Diagnosis not present

## 2015-04-25 DIAGNOSIS — C3491 Malignant neoplasm of unspecified part of right bronchus or lung: Secondary | ICD-10-CM

## 2015-04-25 DIAGNOSIS — C7931 Secondary malignant neoplasm of brain: Secondary | ICD-10-CM

## 2015-04-25 DIAGNOSIS — C3411 Malignant neoplasm of upper lobe, right bronchus or lung: Secondary | ICD-10-CM | POA: Diagnosis present

## 2015-04-25 DIAGNOSIS — E860 Dehydration: Secondary | ICD-10-CM | POA: Diagnosis not present

## 2015-04-25 DIAGNOSIS — I959 Hypotension, unspecified: Secondary | ICD-10-CM | POA: Diagnosis not present

## 2015-04-25 DIAGNOSIS — R21 Rash and other nonspecific skin eruption: Secondary | ICD-10-CM | POA: Diagnosis not present

## 2015-04-25 DIAGNOSIS — R630 Anorexia: Secondary | ICD-10-CM | POA: Diagnosis present

## 2015-04-25 LAB — COMPREHENSIVE METABOLIC PANEL (CC13)
ALBUMIN: 3.1 g/dL — AB (ref 3.5–5.0)
ALK PHOS: 112 U/L (ref 40–150)
ALT: 19 U/L (ref 0–55)
AST: 22 U/L (ref 5–34)
Anion Gap: 9 mEq/L (ref 3–11)
BUN: 9.6 mg/dL (ref 7.0–26.0)
CALCIUM: 9.6 mg/dL (ref 8.4–10.4)
CO2: 25 mEq/L (ref 22–29)
Chloride: 105 mEq/L (ref 98–109)
Creatinine: 0.7 mg/dL (ref 0.7–1.3)
EGFR: >90 mL/min/{1.73_m2} (ref >90)
Glucose: 131 mg/dl (ref 70–140)
POTASSIUM: 3.5 meq/L (ref 3.5–5.1)
Sodium: 139 mEq/L (ref 136–145)
Total Bilirubin: 0.36 mg/dL (ref 0.20–1.20)
Total Protein: 6.9 g/dL (ref 6.4–8.3)

## 2015-04-25 LAB — CBC WITH DIFFERENTIAL/PLATELET
BASO%: 0.6 % (ref 0.0–2.0)
BASOS ABS: 0 10*3/uL (ref 0.0–0.1)
EOS%: 2.5 % (ref 0.0–7.0)
Eosinophils Absolute: 0.1 10*3/uL (ref 0.0–0.5)
HCT: 36.1 % — ABNORMAL LOW (ref 38.4–49.9)
HEMOGLOBIN: 11.9 g/dL — AB (ref 13.0–17.1)
LYMPH%: 9.3 % — ABNORMAL LOW (ref 14.0–49.0)
MCH: 25.8 pg — ABNORMAL LOW (ref 27.2–33.4)
MCHC: 33 g/dL (ref 32.0–36.0)
MCV: 78.1 fL — AB (ref 79.3–98.0)
MONO#: 0.1 10*3/uL (ref 0.1–0.9)
MONO%: 3 % (ref 0.0–14.0)
NEUT#: 3.9 10*3/uL (ref 1.5–6.5)
NEUT%: 84.6 % — ABNORMAL HIGH (ref 39.0–75.0)
Platelets: 221 10*3/uL (ref 140–400)
RBC: 4.62 10*6/uL (ref 4.20–5.82)
RDW: 19.6 % — AB (ref 11.0–14.6)
WBC: 4.6 10*3/uL (ref 4.0–10.3)
lymph#: 0.4 10*3/uL — ABNORMAL LOW (ref 0.9–3.3)

## 2015-04-25 MED ORDER — HEPARIN SOD (PORK) LOCK FLUSH 100 UNIT/ML IV SOLN
500.0000 [IU] | Freq: Once | INTRAVENOUS | Status: AC
  Administered 2015-04-25: 500 [IU]
  Filled 2015-04-25: qty 5

## 2015-04-25 MED ORDER — SODIUM CHLORIDE 0.9 % IV SOLN
Freq: Once | INTRAVENOUS | Status: AC
  Administered 2015-04-25: 17:00:00 via INTRAVENOUS

## 2015-04-25 MED ORDER — SODIUM CHLORIDE 0.9 % IJ SOLN
10.0000 mL | Freq: Once | INTRAMUSCULAR | Status: AC
  Administered 2015-04-25: 10 mL
  Filled 2015-04-25: qty 10

## 2015-04-25 NOTE — Patient Instructions (Signed)
Dehydration, Adult Dehydration is when you lose more fluids from the body than you take in. Vital organs like the kidneys, brain, and heart cannot function without a proper amount of fluids and salt. Any loss of fluids from the body can cause dehydration.  CAUSES   Vomiting.  Diarrhea.  Excessive sweating.  Excessive urine output.  Fever. SYMPTOMS  Mild dehydration  Thirst.  Dry lips.  Slightly dry mouth. Moderate dehydration  Very dry mouth.  Sunken eyes.  Skin does not bounce back quickly when lightly pinched and released.  Dark urine and decreased urine production.  Decreased tear production.  Headache. Severe dehydration  Very dry mouth.  Extreme thirst.  Rapid, weak pulse (more than 100 beats per minute at rest).  Cold hands and feet.  Not able to sweat in spite of heat and temperature.  Rapid breathing.  Blue lips.  Confusion and lethargy.  Difficulty being awakened.  Minimal urine production.  No tears. DIAGNOSIS  Your caregiver will diagnose dehydration based on your symptoms and your exam. Blood and urine tests will help confirm the diagnosis. The diagnostic evaluation should also identify the cause of dehydration. TREATMENT  Treatment of mild or moderate dehydration can often be done at home by increasing the amount of fluids that you drink. It is best to drink small amounts of fluid more often. Drinking too much at one time can make vomiting worse. Refer to the home care instructions below. Severe dehydration needs to be treated at the hospital where you will probably be given intravenous (IV) fluids that contain water and electrolytes. HOME CARE INSTRUCTIONS   Ask your caregiver about specific rehydration instructions.  Drink enough fluids to keep your urine clear or pale yellow.  Drink small amounts frequently if you have nausea and vomiting.  Eat as you normally do.  Avoid:  Foods or drinks high in sugar.  Carbonated  drinks.  Juice.  Extremely hot or cold fluids.  Drinks with caffeine.  Fatty, greasy foods.  Alcohol.  Tobacco.  Overeating.  Gelatin desserts.  Wash your hands well to avoid spreading bacteria and viruses.  Only take over-the-counter or prescription medicines for pain, discomfort, or fever as directed by your caregiver.  Ask your caregiver if you should continue all prescribed and over-the-counter medicines.  Keep all follow-up appointments with your caregiver. SEEK MEDICAL CARE IF:  You have abdominal pain and it increases or stays in one area (localizes).  You have a rash, stiff neck, or severe headache.  You are irritable, sleepy, or difficult to awaken.  You are weak, dizzy, or extremely thirsty. SEEK IMMEDIATE MEDICAL CARE IF:   You are unable to keep fluids down or you get worse despite treatment.  You have frequent episodes of vomiting or diarrhea.  You have blood or green matter (bile) in your vomit.  You have blood in your stool or your stool looks black and tarry.  You have not urinated in 6 to 8 hours, or you have only urinated a small amount of very dark urine.  You have a fever.  You faint. MAKE SURE YOU:   Understand these instructions.  Will watch your condition.  Will get help right away if you are not doing well or get worse. Document Released: 08/05/2005 Document Revised: 10/28/2011 Document Reviewed: 03/25/2011 ExitCare Patient Information 2015 [employer]. This information is not intended to replace advice given to you by your health care provider. Make sure you discuss any questions you have with your health care   provider.  

## 2015-04-27 ENCOUNTER — Encounter: Payer: Self-pay | Admitting: Nurse Practitioner

## 2015-04-27 ENCOUNTER — Telehealth: Payer: Self-pay | Admitting: Internal Medicine

## 2015-04-27 NOTE — Assessment & Plan Note (Signed)
Patient continues with minimal appetite and poor oral intake.  However, patient's weight remains fairly stable.  Patient was encouraged to push protein in his diet; and eat multiple small meals throughout the day.

## 2015-04-27 NOTE — Assessment & Plan Note (Signed)
Pt's third cycle of Nivolumab immunotherapy has been on hold for the past few weeks due to progressive rash and blisters to various areas of his body.  Patient also appeared dehydrated and was suffering with some hypotension.  Patient received IV fluid rehydration instead last week.  On recheck today-patient continues to appear fairly dehydrated and frail/thin.  He states that his appetite has greatly improved over this past week; but he continues with a low/stable weight.  He insist that he is both drinking and eating now fairly well.  Blood pressure on initial check in the cancer center was 80/51 with a heart rate of 90.  Patient confirmed that he doesn't take any blood pressure medications.  Patient typically runs a low blood pressure; and only somewhat response to IV fluid rehydration in the past. Blood counts obtained today were essentially stable; with WBC 4.6, ANC 3.9, hemoglobin 11.9, and platelet count 221.  Patient will receive IV fluid rehydration today.  Patient was initiated with Keflex antibiotics last week for treatment of rash.  Culture of blisters/rash was staph positive.  On exam.-Most of patient's old blisters have opened and are slowly drying/healing.  Patient also has some newly formed blisters to his left lateral rib area, and to the back of his neck.  He denies any recent fevers or chills.  Patient was ordered a dermatology referral.  A few weeks ago; the patient states that he is still awaiting a dermatology appointment.  Advised patient was personally attempt to arrange for either a dermatology or an infectious disease appointment for as soon as possible for further evaluation and management.    The plan is for the patient to hold his immunotherapy for at least 2 more weeks so the rash can be further evaluated.  Patient was advised to call/return of her directly to the emergency department for any worsening symptoms whatsoever.

## 2015-04-27 NOTE — Telephone Encounter (Signed)
Mailed pt appt sched for sept...NO vm

## 2015-04-27 NOTE — Assessment & Plan Note (Addendum)
On recheck today-patient continues to appear fairly dehydrated and frail/thin.  He states that his appetite has greatly improved over this past week; but he continues with a low/stable weight.  He insist that he is both drinking and eating now fairly well.  Patient will receive IV fluids today while at the cancer center.  He was encouraged to continue push fluids at home is much as possible.

## 2015-04-27 NOTE — Progress Notes (Signed)
SYMPTOM MANAGEMENT CLINIC   HPI: Brendan Hooper 69 y.o. male diagnosed with lung cancer; with both brain and bone metastasis.  Currently undergoing Nivolumab immunotherapy.   Pt's third cycle of Nivolumab immunotherapy has been on hold for the past few weeks due to progressive rash and blisters to various areas of his body.  Patient also appeared dehydrated and was suffering with some hypotension.  Patient received IV fluid rehydration instead last week.  On recheck today-patient continues to appear fairly dehydrated and frail/thin.  He states that his appetite has greatly improved over this past week; but he continues with a low/stable weight.  He insist that he is both drinking and eating now fairly well.  Blood pressure on initial check in the cancer center was 80/51 with a heart rate of 90.  Patient confirmed that he doesn't take any blood pressure medications.  Patient typically runs a low blood pressure; and only somewhat response to IV fluid rehydration in the past. Blood counts obtained today were essentially stable; with WBC 4.6, ANC 3.9, hemoglobin 11.9, and platelet count 221.  Patient will receive IV fluid rehydration today.  Patient was initiated with Keflex antibiotics last week for treatment of rash.  Culture of blisters/rash was staph positive.  On exam.-Most of patient's old blisters have opened and are slowly drying/healing.  Patient also has some newly formed blisters to his left lateral rib area, and to the back of his neck.  He denies any recent fevers or chills.  Patient was ordered a dermatology referral.  A few weeks ago; the patient states that he is still awaiting a dermatology appointment.  Advised patient was personally attempt to arrange for either a dermatology or an infectious disease appointment for as soon as possible for further evaluation and management.    The plan is for the patient to hold his immunotherapy for at least 2 more weeks so the rash can be further  evaluated.  Patient was advised to call/return of her directly to the emergency department for any worsening symptoms whatsoever.    Rash    Review of Systems  Skin: Positive for rash.    Past Medical History  Diagnosis Date  . GERD (gastroesophageal reflux disease)   . Headache(784.0)   . Sickle cell anemia   . Lung mass   . Hypertension   . 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 No past surgeries    . Portacath placement Left 09/01/2014    Procedure: INSERTION PORT-A-CATH;  Surgeon: Melrose Nakayama, MD;  Location: [facility];  Service: Thoracic;  Laterality: Left;    has Syncope; Alcohol abuse; HTN (hypertension); Current smoker; Postobstructive pneumonia; CAP (community acquired pneumonia); Lung mass; Abscess of right groin; Enlargement of lymph node; Protein-calorie malnutrition, severe; Hypokalemia; Bronchogenic cancer of right lung; Brain metastases; Neck pain; Hypomagnesemia; Dehydration; FTT (failure to thrive) in adult; Generalized weakness; Weakness; Hypotension arterial; Oral thrush; Hypotension; Thrush; Anorexia; Weight loss; Hypoalbuminemia; and Rash on his problem list.    has No Known Allergies.    Medication List       This list is accurate as of: 04/25/15 11:59 PM.  Always use your most recent med list.               acetaminophen 325 MG tablet  Commonly known as:  TYLENOL  Take 2  tablets (650 mg total) by mouth every 6 (six) hours as needed for mild pain (or Fever >/= 101).     alum & mag hydroxide-simeth 200-200-20 MG/5ML suspension  Commonly known as:  MAALOX/MYLANTA  Take 30 mLs by mouth every 6 (six) hours as needed for indigestion or heartburn (dyspepsia).     cholecalciferol 1000 UNITS tablet  Commonly known as:  VITAMIN D  Take 1,000 Units by mouth once a week.     dexamethasone 4 MG tablet  Commonly known as:   DECADRON  Take 4 mg by mouth 2 (two) times daily. Take 1 tablet the day before teh day of and the day after chemo.     feeding supplement (ENSURE ENLIVE) Liqd  Take 237 mLs by mouth 3 (three) times daily between meals.     fluconazole 200 MG tablet  Commonly known as:  DIFLUCAN  Take 1 tablet (200 mg total) by mouth daily.     folic acid 1 MG tablet  Commonly known as:  FOLVITE  Take 1 tablet (1 mg total) by mouth daily.     lidocaine-prilocaine cream  Commonly known as:  EMLA  Apply 1 application topically as needed.     multivitamin with minerals Tabs tablet  Take 1 tablet by mouth daily.     ondansetron 4 MG tablet  Commonly known as:  ZOFRAN  Take 1 tablet (4 mg total) by mouth every 6 (six) hours as needed for nausea.     oxyCODONE 5 MG immediate release tablet  Commonly known as:  Oxy IR/ROXICODONE  Take 1-2 tablets (5-10 mg total) by mouth every 4 (four) hours as needed for moderate pain.     potassium chloride SA 20 MEQ tablet  Commonly known as:  K-DUR,KLOR-CON  Take 1 tablet (20 mEq total) by mouth 2 (two) times daily.     potassium chloride SA 20 MEQ tablet  Commonly known as:  K-DUR,KLOR-CON  Take 2 tablets (40 mEq total) by mouth once.     prochlorperazine 10 MG tablet  Commonly known as:  COMPAZINE  Take 1 tablet (10 mg total) by mouth every 6 (six) hours as needed for nausea or vomiting.     thiamine 100 MG tablet  Take 1 tablet (100 mg total) by mouth daily.         PHYSICAL EXAMINATION  Oncology Vitals 04/25/2015 04/25/2015 04/25/2015 04/10/2015 04/03/2015 04/03/2015 03/27/2015  Height - - 180 cm 180 cm - 180 cm -  Weight - - 54.25 kg 51.574 kg - 52.118 kg -  Weight (lbs) - - 119 lbs 10 oz 113 lbs 11 oz - 114 lbs 14 oz -  BMI (kg/m2) - - 16.68 kg/m2 15.86 kg/m2 - 16.03 kg/m2 -  Temp - - 98.1 97.5 96.9 97.9 97.9  Pulse 93 94 99 90 97 118 82  Resp - - 18 18 18 18 18   SpO2 100 - 93 100 100 100 100  BSA (m2) - - 1.65 m2 1.61 m2 - 1.62 m2 -   BP Readings  from Last 3 Encounters:  04/25/15 86/52  04/25/15 81/54  04/10/15 81/58    Physical Exam  Constitutional: He is oriented to person, place, and time.  Patient appears fatigued, weak, frail and very thin.  HENT:  Head: Normocephalic and atraumatic.  Minimal white coating to tongue at present time.  No new oral lesions.  Eyes: Conjunctivae and EOM are normal. Pupils are equal, round, and reactive to light. Right eye exhibits no  discharge. Left eye exhibits no discharge. No scleral icterus.  Neck: Normal range of motion. Neck supple. No JVD present. No tracheal deviation present. No thyromegaly present.  Cardiovascular: Normal rate, regular rhythm, normal heart sounds and intact distal pulses.   Pulmonary/Chest: Effort normal and breath sounds normal. No respiratory distress. He has no wheezes. He has no rales. He exhibits no tenderness.  Abdominal: Soft. Bowel sounds are normal. He exhibits no distension and no mass. There is no tenderness. There is no rebound and no guarding.  Musculoskeletal: Normal range of motion. He exhibits no edema or tenderness.  Lymphadenopathy:    He has no cervical adenopathy.  Neurological: He is alert and oriented to person, place, and time.  In wheelchair during exam.  Skin: Skin is warm and dry. Rash noted. No erythema. No pallor.  Patient has healed rash to his anterior neck and his left antecubital region.  Patient has rash with large blisters to his right antecubital in his bilateral axilla.  Left axilla blisters appear to have ruptured with some purulent discharge.  There is no surrounding erythema, edema, warmth, or tenderness.  Also, no red streaks noted.  Psychiatric: Affect normal.  Nursing note and vitals reviewed.   LABORATORY DATA:. Appointment on 04/25/2015  Component Date Value Ref Range Status  . WBC 04/25/2015 4.6  4.0 - 10.3 10e3/uL Final  . NEUT# 04/25/2015 3.9  1.5 - 6.5 10e3/uL Final  . HGB 04/25/2015 11.9* 13.0 - 17.1 g/dL Final  .  HCT 04/25/2015 36.1* 38.4 - 49.9 % Final  . Platelets 04/25/2015 221  140 - 400 10e3/uL Final  . MCV 04/25/2015 78.1* 79.3 - 98.0 fL Final  . MCH 04/25/2015 25.8* 27.2 - 33.4 pg Final  . MCHC 04/25/2015 33.0  32.0 - 36.0 g/dL Final  . RBC 04/25/2015 4.62  4.20 - 5.82 10e6/uL Final  . RDW 04/25/2015 19.6* 11.0 - 14.6 % Final  . lymph# 04/25/2015 0.4* 0.9 - 3.3 10e3/uL Final  . MONO# 04/25/2015 0.1  0.1 - 0.9 10e3/uL Final  . Eosinophils Absolute 04/25/2015 0.1  0.0 - 0.5 10e3/uL Final  . Basophils Absolute 04/25/2015 0.0  0.0 - 0.1 10e3/uL Final  . NEUT% 04/25/2015 84.6* 39.0 - 75.0 % Final  . LYMPH% 04/25/2015 9.3* 14.0 - 49.0 % Final  . MONO% 04/25/2015 3.0  0.0 - 14.0 % Final  . EOS% 04/25/2015 2.5  0.0 - 7.0 % Final  . BASO% 04/25/2015 0.6  0.0 - 2.0 % Final  . Sodium 04/25/2015 139  136 - 145 mEq/L Final  . Potassium 04/25/2015 3.5  3.5 - 5.1 mEq/L Final  . Chloride 04/25/2015 105  98 - 109 mEq/L Final  . CO2 04/25/2015 25  22 - 29 mEq/L Final  . Glucose 04/25/2015 131  70 - 140 mg/dl Final  . BUN 04/25/2015 9.6  7.0 - 26.0 mg/dL Final  . Creatinine 04/25/2015 0.7  0.7 - 1.3 mg/dL Final  . Total Bilirubin 04/25/2015 0.36  0.20 - 1.20 mg/dL Final  . Alkaline Phosphatase 04/25/2015 112  40 - 150 U/L Final  . AST 04/25/2015 22  5 - 34 U/L Final  . ALT 04/25/2015 19  0 - 55 U/L Final  . Total Protein 04/25/2015 6.9  6.4 - 8.3 g/dL Final  . Albumin 04/25/2015 3.1* 3.5 - 5.0 g/dL Final  . Calcium 04/25/2015 9.6  8.4 - 10.4 mg/dL Final  . Anion Gap 04/25/2015 9  3 - 11 mEq/L Final  . EGFR 04/25/2015 >90  >  90 ml/min/1.73 m2 Final   eGFR is calculated using the CKD-EPI Creatinine Equation (2009)   Left axilla:   Neck:     RADIOGRAPHIC STUDIES: No results found.  ASSESSMENT/PLAN:    Anorexia Patient continues with minimal appetite and poor oral intake.  However, patient's weight remains fairly stable.  Patient was encouraged to push protein in his diet; and eat multiple small  meals throughout the day.      Bronchogenic cancer of right lung Pt's third cycle of Nivolumab immunotherapy has been on hold for the past few weeks due to progressive rash and blisters to various areas of his body.  Patient also appeared dehydrated and was suffering with some hypotension.  Patient received IV fluid rehydration instead last week.  On recheck today-patient continues to appear fairly dehydrated and frail/thin.  He states that his appetite has greatly improved over this past week; but he continues with a low/stable weight.  He insist that he is both drinking and eating now fairly well.  Blood pressure on initial check in the cancer center was 80/51 with a heart rate of 90.  Patient confirmed that he doesn't take any blood pressure medications.  Patient typically runs a low blood pressure; and only somewhat response to IV fluid rehydration in the past. Blood counts obtained today were essentially stable; with WBC 4.6, ANC 3.9, hemoglobin 11.9, and platelet count 221.  Patient will receive IV fluid rehydration today.  Patient was initiated with Keflex antibiotics last week for treatment of rash.  Culture of blisters/rash was staph positive.  On exam.-Most of patient's old blisters have opened and are slowly drying/healing.  Patient also has some newly formed blisters to his left lateral rib area, and to the back of his neck.  He denies any recent fevers or chills.  Patient was ordered a dermatology referral.  A few weeks ago; the patient states that he is still awaiting a dermatology appointment.  Advised patient was personally attempt to arrange for either a dermatology or an infectious disease appointment for as soon as possible for further evaluation and management.    The plan is for the patient to hold his immunotherapy for at least 2 more weeks so the rash can be further evaluated.  Patient was advised to call/return of her directly to the emergency department for any worsening  symptoms whatsoever.              Dehydration On recheck today-patient continues to appear fairly dehydrated and frail/thin.  He states that his appetite has greatly improved over this past week; but he continues with a low/stable weight.  He insist that he is both drinking and eating now fairly well.  Patient will receive IV fluids today while at the cancer center.  He was encouraged to continue push fluids at home is much as possible.      Rash Pt's third cycle of Nivolumab immunotherapy has been on hold for the past few weeks due to progressive rash and blisters to various areas of his body.  Patient also appeared dehydrated and was suffering with some hypotension.  Patient received IV fluid rehydration instead last week.  On recheck today-patient continues to appear fairly dehydrated and frail/thin.  He states that his appetite has greatly improved over this past week; but he continues with a low/stable weight.  He insist that he is both drinking and eating now fairly well.  Blood pressure on initial check in the cancer center was 80/51 with a heart rate  of 90.  Patient confirmed that he doesn't take any blood pressure medications.  Patient typically runs a low blood pressure; and only somewhat response to IV fluid rehydration in the past. Blood counts obtained today were essentially stable; with WBC 4.6, ANC 3.9, hemoglobin 11.9, and platelet count 221.  Patient will receive IV fluid rehydration today.  Patient was initiated with Keflex antibiotics last week for treatment of rash.  Culture of blisters/rash was staph positive.  On exam.-Most of patient's old blisters have opened and are slowly drying/healing.  Patient also has some newly formed blisters to his left lateral rib area, and to the back of his neck.  He denies any recent fevers or chills.  Patient was ordered a dermatology referral.  A few weeks ago; the patient states that he is still awaiting a dermatology appointment.   Advised patient was personally attempt to arrange for either a dermatology or an infectious disease appointment for as soon as possible for further evaluation and management.    The plan is for the patient to hold his immunotherapy for at least 2 more weeks so the rash can be further evaluated.  Patient was advised to call/return of her directly to the emergency department for any worsening symptoms whatsoever.            Patient stated understanding of all instructions; and was in agreement with this plan of care. The patient knows to call the clinic with any problems, questions or concerns.   All details of today's visit were reviewed with Dr. [name].  Total time spent with patient was 25 minutes;  with greater than 75 percent of that time spent in face to face counseling regarding patient's symptoms,  and coordination of care and follow up.  Disclaimer: This note was dictated with voice recognition software. Similar sounding words can inadvertently be transcribed and may not be corrected upon review.   Drue Second, NP 04/27/2015

## 2015-04-30 ENCOUNTER — Other Ambulatory Visit: Payer: Self-pay | Admitting: Physician Assistant

## 2015-04-30 ENCOUNTER — Other Ambulatory Visit: Payer: Self-pay | Admitting: Internal Medicine

## 2015-04-30 DIAGNOSIS — E876 Hypokalemia: Secondary | ICD-10-CM

## 2015-05-01 ENCOUNTER — Telehealth: Payer: Self-pay

## 2015-05-01 ENCOUNTER — Telehealth: Payer: Self-pay | Admitting: Nurse Practitioner

## 2015-05-01 ENCOUNTER — Other Ambulatory Visit: Payer: Self-pay | Admitting: Nurse Practitioner

## 2015-05-01 DIAGNOSIS — C3491 Malignant neoplasm of unspecified part of right bronchus or lung: Secondary | ICD-10-CM

## 2015-05-01 DIAGNOSIS — R21 Rash and other nonspecific skin eruption: Secondary | ICD-10-CM

## 2015-05-01 NOTE — Telephone Encounter (Signed)
Called and spoke to patient's sister, Vaughan Basta this evening.  Confirm that she is already aware of her appointment with infectious disease specialist on 05/08/2015 and 11:15 AM.  Also advised patient's sister that would need to reschedule her [facility] appointments scheduled for that same day.  Advised patient that he schedules would be: To reschedule cancer Center appointments.  Patient's sister stated understanding of all instructions; was in agreement with this plan of care.

## 2015-05-01 NOTE — Telephone Encounter (Signed)
appt made with Dr Linus Salmons of infectious disease for 05/08/15 at 1115 am. The appt for Dr [name] will be moved to after this. Clinic for Infectious Disease is located in [facility] [place] Gave pt this information and he asked we call his sister. LVM on sister's machine.

## 2015-05-02 ENCOUNTER — Other Ambulatory Visit: Payer: Self-pay | Admitting: Physician Assistant

## 2015-05-02 ENCOUNTER — Other Ambulatory Visit: Payer: Self-pay | Admitting: Internal Medicine

## 2015-05-02 DIAGNOSIS — C3491 Malignant neoplasm of unspecified part of right bronchus or lung: Secondary | ICD-10-CM

## 2015-05-02 DIAGNOSIS — C7931 Secondary malignant neoplasm of brain: Secondary | ICD-10-CM

## 2015-05-03 ENCOUNTER — Telehealth: Payer: Self-pay | Admitting: Internal Medicine

## 2015-05-03 NOTE — Telephone Encounter (Signed)
Spoke with patient sister Ezra Sites new appointment for 9/21 - sister aware 9/19 f/u w/MM is cxd. See previous notes from [facility] and desk nurse patient/sister aware of appointment with Dr. Linus Salmons (inf disease). Awaiting response from [facility] re referral reason to dermatology and where rash is located. Per 9/12 pof - 9/19 f/u rescheduled.

## 2015-05-04 ENCOUNTER — Other Ambulatory Visit: Payer: Self-pay | Admitting: Medical Oncology

## 2015-05-04 ENCOUNTER — Telehealth: Payer: Self-pay | Admitting: Medical Oncology

## 2015-05-04 DIAGNOSIS — C7931 Secondary malignant neoplasm of brain: Secondary | ICD-10-CM

## 2015-05-04 DIAGNOSIS — C3491 Malignant neoplasm of unspecified part of right bronchus or lung: Secondary | ICD-10-CM

## 2015-05-04 MED ORDER — OXYCODONE HCL 5 MG PO TABS: 5.0000 mg | ORAL_TABLET | ORAL | Status: DC | PRN

## 2015-05-05 NOTE — Telephone Encounter (Signed)
I told Vaughan Basta pt pain rx is ready for pick up and asked her to ask pt about his mouth if he needs refill for MMW . She will call back. I also told her that his potassium level will be checked next wed and see if he needs more Kdur.

## 2015-05-08 ENCOUNTER — Ambulatory Visit (INDEPENDENT_AMBULATORY_CARE_PROVIDER_SITE_OTHER): Payer: Medicare Other | Admitting: Internal Medicine

## 2015-05-08 ENCOUNTER — Ambulatory Visit: Payer: Medicare Other | Admitting: Internal Medicine

## 2015-05-08 ENCOUNTER — Encounter: Payer: Self-pay | Admitting: Internal Medicine

## 2015-05-08 VITALS — BP 96/63 | HR 87 | Temp 97.8°F | Ht 72.5 in | Wt 129.0 lb

## 2015-05-08 DIAGNOSIS — L512 Toxic epidermal necrolysis [Lyell]: Secondary | ICD-10-CM | POA: Insufficient documentation

## 2015-05-08 NOTE — Progress Notes (Signed)
    [facility] for Infectious Disease      Reason for Consult: rash    Referring Physician: Dr. [name]  Recommendations: He is going to dermatology, Dr. Allyson Sabal, tomorrow am to confirm diagnosis, biopsy and recommendations on management. I appreciate him seeing him so quickly.  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  Though typically this can lead to severe complications, he has had this about 1 month so I do not feel acute hospitalization is indicated.  Will wait for dermatology recommendations and treatment considerations.  I suspect he will need to avoid the Nivolumab since this, if confirmed TEN, would be the likely cause.  I am not sure if other monoclonal antibiodies also would be contraindicated.     Antibiotics: Keflex  HPI: Brendan Hooper is a 69 y.o. male with lung cancer and metastasis to brain, bone, who presents with a rash. Rash started after starting chemotherapy and noted mid-August after 2 cycles of Nivolumab.  Rash is with bullae, is over 10% of body, started on neck and both arms, now on head, abdomen, back.  Some new areas, some drying.  Oncology did do a culture with MSSA and was put on Keflex.  No fever, no chills.  Chemotherapy on hold for rash.  No history of rash before.   Review of Systems: A comprehensive review of systems was negative except for: Constitutional: positive for fatigue  Past Medical History  Diagnosis Date  . GERD (gastroesophageal reflux disease)   . Headache(784.0)   . Sickle cell anemia   . Lung mass   . Hypertension   . 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 Smoking status: Current Every Day Smoker -- 0.50 packs/day for 40 years    Types: Cigarettes    Start date: 08/19/1957  . Smokeless tobacco: Never Used    . Alcohol Use: 0.0 oz/week    0 Standard drinks or equivalent per week     Comment: social    Family History  Problem Relation Age of Onset  . Cancer Mother   . Stroke Father    No Known Allergies  OBJECTIVE: Blood pressure 96/63, pulse 87, temperature 97.8 F (36.6 C), temperature source Oral, height 6' 0.5" (1.842 m), weight 129 lb (58.514 kg). General: awake, alert, nad; thin appearing HEENT: anicteric, no thrush; no lesions in mucous membranes Skin: diffuse rash including areas of hyperpigmentation with dried lesion, new areas of bullae with clear fluid on arms, torso, back, areas of denuded bullae; no surrounding erythema Lungs: CTA B Cor: RRR Abdomen: soft, thin Ext: no edema Neuro: non-focal   Microbiology: No results found for this or any previous visit (from the past 240 hour(s)).  Scharlene Gloss, [facility] for Infectious Disease [place] [email or web address] [phone] pager  [phone] cell 05/08/2015, 11:11 AM

## 2015-05-09 ENCOUNTER — Other Ambulatory Visit: Payer: Self-pay | Admitting: Dermatology

## 2015-05-10 ENCOUNTER — Other Ambulatory Visit: Payer: Self-pay | Admitting: Medical Oncology

## 2015-05-10 ENCOUNTER — Ambulatory Visit (HOSPITAL_BASED_OUTPATIENT_CLINIC_OR_DEPARTMENT_OTHER): Payer: Medicare Other | Admitting: Internal Medicine

## 2015-05-10 ENCOUNTER — Encounter: Payer: Self-pay | Admitting: Internal Medicine

## 2015-05-10 ENCOUNTER — Telehealth: Payer: Self-pay | Admitting: Internal Medicine

## 2015-05-10 ENCOUNTER — Telehealth: Payer: Self-pay | Admitting: *Deleted

## 2015-05-10 VITALS — BP 96/66 | HR 87 | Temp 97.9°F | Resp 18 | Ht 72.0 in | Wt 123.8 lb

## 2015-05-10 DIAGNOSIS — C3491 Malignant neoplasm of unspecified part of right bronchus or lung: Secondary | ICD-10-CM | POA: Diagnosis present

## 2015-05-10 DIAGNOSIS — L512 Toxic epidermal necrolysis [Lyell]: Secondary | ICD-10-CM | POA: Diagnosis not present

## 2015-05-10 NOTE — Telephone Encounter (Signed)
Oncology Nurse Navigator Documentation  Oncology Nurse Navigator Flowsheets 05/10/2015  Navigator Encounter Type Telephone/Called patient to follow up regarding his smoking cessation.  I was unable to leave him a vm message  Treatment Phase Treatment  Barriers/Navigation Needs Education  Education Smoking cessation  Time Spent with Patient 15

## 2015-05-10 NOTE — Telephone Encounter (Signed)
Gave and printed appt sched and avs for pt for OCT/.///gv barium

## 2015-05-10 NOTE — Progress Notes (Signed)
[facility] [phone]   [phone]  OFFICE PROGRESS NOTE  [name], MD No address on file  DIAGNOSIS: stage IV (T3, N2, M1 B) non-small cell lung cancer, adenocarcinoma presented with large right lung mass in addition to mediastinal lymphadenopathy and metastatic lesion in the right thigh diagnosed in December 2015.  PRIOR THERAPY: Systemic chemotherapy with carboplatin AUC of 5 and Alimta 500 mg/m started on 09/05/2014. Status post 6 cycles discontinued secondary to disease progression.  CURRENT THERAPY: Immunotherapy with Nivolumab 3 MG/KG every 2 weeks, status post 3 cycle.  INTERVAL HISTORY: Brendan Hooper 69 y.o. male returns to the clinic today for follow-up visit accompanied by family member. 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 This is concerning to be secondary to his treatment with Nivolumab. His treatment has been on hold for the last 5 weeks or so. He was seen by infectious disease, Dr. Novella Olive. He was also seen recently by Dr. Allyson Sabal, dermatology and biopsy was performed from the right arm lesions. The final pathology is still pending. The patient is feeling fine today except for the extensive skin rash on the upper chest, neck as well as upper extremities. He denied having any significant weight loss or night sweats. He denied having any chest pain, shortness breath, cough or hemoptysis. He has no nausea or vomiting. He was here today for evaluation and recommendation regarding his condition.  MEDICAL HISTORY: Past Medical History  Diagnosis Date  . GERD (gastroesophageal reflux disease)   . Headache(784.0)   . Sickle cell anemia   . Lung mass   . Hypertension   . 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  MEDICATIONS:    Current Outpatient Prescriptions  Medication Sig Dispense Refill  . acetaminophen (TYLENOL) 325 MG tablet Take 2 tablets (650 mg total) by mouth every 6 (six) hours as needed for mild pain (or Fever >/= 101). 30 tablet 0  . alum & mag hydroxide-simeth (MAALOX/MYLANTA) 200-200-20 MG/5ML suspension Take 30 mLs by mouth every 6 (six) hours as needed for indigestion or heartburn (dyspepsia). 355 mL 0  . cholecalciferol (VITAMIN D) 1000 UNITS tablet Take 1,000 Units by mouth once a week.     Marland Kitchen dexamethasone (DECADRON) 4 MG tablet Take 4 mg by mouth 2 (two) times daily. Take 1 tablet the day before teh day of and the day after chemo.  0  . feeding supplement, ENSURE ENLIVE, (ENSURE ENLIVE) LIQD Take 237 mLs by mouth 3 (three) times daily between meals. (Patient taking differently: Take 237 mLs by mouth 2 (two) times a week. ) 876 mL 12  . folic acid (FOLVITE) 1 MG tablet Take 1 tablet (1 mg total) by mouth daily. 30 tablet 0  . lidocaine-prilocaine (EMLA) cream Apply 1 application topically as needed. (Patient taking differently: Apply 1 application topically as needed (port access). ) 30 g 1  . Multiple Vitamin (MULTIVITAMIN WITH MINERALS) TABS tablet Take 1 tablet by mouth daily. (Patient taking differently: Take 1 tablet by mouth once a week. ) 30 tablet 0  . ondansetron (ZOFRAN) 4 MG tablet Take 1 tablet (4 mg total) by mouth every 6 (six) hours as needed for nausea. 20 tablet 0  . oxyCODONE (OXY IR/ROXICODONE) 5 MG immediate  release tablet Take 1-2 tablets (5-10 mg total) by mouth every 4 (four) hours as needed for moderate pain. 60 tablet 0  . potassium chloride SA (K-DUR,KLOR-CON) 20 MEQ tablet Take 1 tablet (20 mEq total) by mouth 2 (two) times daily. 14 tablet 0  . prochlorperazine (COMPAZINE) 10 MG tablet Take 1 tablet (10 mg total) by mouth every 6 (six) hours as needed for nausea or vomiting. 60 tablet 1  . thiamine 100 MG tablet Take 1 tablet (100 mg total) by mouth daily. (Patient taking  differently: Take 100 mg by mouth once a week. ) 30 tablet 0   No current facility-administered medications for this visit.   Facility-Administered Medications Ordered in Other Visits  Medication Dose Route Frequency Provider Last Rate Last Dose  . sodium chloride 0.9 % injection 10 mL  10 mL Intravenous PRN Curt Bears, MD   10 mL at 02/16/15 1756    SURGICAL HISTORY:  Past Surgical History  Procedure Laterality Date  . No past surgeries    . Portacath placement Left 09/01/2014    Procedure: INSERTION PORT-A-CATH;  Surgeon: Melrose Nakayama, MD;  Location: [facility];  Service: Thoracic;  Laterality: Left;    REVIEW OF SYSTEMS:  Constitutional: positive for fatigue Eyes: negative Ears, nose, mouth, throat, and face: negative Respiratory: positive for dyspnea on exertion Cardiovascular: negative Gastrointestinal: negative Genitourinary:negative Integument/breast: positive for dryness, rash and skin lesion(s) Hematologic/lymphatic: negative Musculoskeletal:negative Neurological: negative Behavioral/Psych: negative Endocrine: negative Allergic/Immunologic: negative   PHYSICAL EXAMINATION: General appearance: alert, cooperative, fatigued and no distress Head: Normocephalic, without obvious abnormality, atraumatic Neck: no adenopathy, no JVD, supple, symmetrical, trachea midline and thyroid not enlarged, symmetric, no tenderness/mass/nodules Lymph nodes: Cervical, supraclavicular, and axillary nodes normal. Resp: clear to auscultation bilaterally Back: symmetric, no curvature. ROM normal. No CVA tenderness. Cardio: regular rate and rhythm, S1, S2 normal, no murmur, click, rub or gallop GI: soft, non-tender; bowel sounds normal; no masses,  no organomegaly Extremities: extremities normal, atraumatic, no cyanosis or edema Neurologic: Alert and oriented X 3, normal strength and tone. Normal symmetric reflexes. Normal coordination and gait  Skin exam: Extensive macular and  pustular necrotic skin rash on the upper chest, front and back of the neck as well as upper extremities.  ECOG PERFORMANCE STATUS: 2 - Symptomatic, <50% confined to bed  Blood pressure 96/66, pulse 87, temperature 97.9 F (36.6 C), temperature source Oral, resp. rate 18, height 6' (1.829 m), weight 123 lb 12.8 oz (56.155 kg), SpO2 100 %.  LABORATORY DATA: Lab Results  Component Value Date   WBC 4.6 04/25/2015   HGB 11.9* 04/25/2015   HCT 36.1* 04/25/2015   MCV 78.1* 04/25/2015   PLT 221 04/25/2015      Chemistry      Component Value Date/Time   NA 139 04/25/2015 1429   NA 137 03/27/2015 1952   K 3.5 04/25/2015 1429   K 2.8* 03/27/2015 1952   CL 100* 03/27/2015 1952   CO2 25 04/25/2015 1429   CO2 27 03/27/2015 1952   BUN 9.6 04/25/2015 1429   BUN 9 03/27/2015 1952   CREATININE 0.7 04/25/2015 1429   CREATININE 0.58* 03/27/2015 1952      Component Value Date/Time   CALCIUM 9.6 04/25/2015 1429   CALCIUM 10.0 03/27/2015 1952   ALKPHOS 112 04/25/2015 1429   ALKPHOS 88 03/27/2015 1952   AST 22 04/25/2015 1429   AST 24 03/27/2015 1952   ALT 19 04/25/2015 1429   ALT 11* 03/27/2015 1952  BILITOT 0.36 04/25/2015 1429   BILITOT 0.1* 03/27/2015 1952       RADIOGRAPHIC STUDIES: No results found.  ASSESSMENT AND PLAN: This is a very pleasant 69 years old [place] American male with history of stage IV non-small cell lung cancer, adenocarcinoma and currently undergoing systemic chemotherapy with carboplatin and Alimta status post 6 cycles. This was discontinued secondary to disease progression. 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 The skin lesions are currently under evaluation by dermatology and infectious disease. The patient may benefit from treatment with steroids if the diagnosis is confirmed. In the  meantime I will order repeat CT scan of the chest, abdomen and pelvis for restaging of his disease. The patient would come back for follow-up visit in 3 weeks for reevaluation and discussion of his scan results and further recommendation regarding treatment of lung cancer. He was advised to call immediately if he has any concerning symptoms in the interval.  The patient voices understanding of current disease status and treatment options and is in agreement with the current care plan.  All questions were answered. The patient knows to call the clinic with any problems, questions or concerns. We can certainly see the patient much sooner if necessary.  Disclaimer: This note was dictated with voice recognition software. Similar sounding words can inadvertently be transcribed and may not be corrected upon review.

## 2015-05-23 ENCOUNTER — Other Ambulatory Visit: Payer: Self-pay | Admitting: Medical Oncology

## 2015-05-26 ENCOUNTER — Other Ambulatory Visit: Payer: Self-pay | Admitting: Physician Assistant

## 2015-05-26 ENCOUNTER — Other Ambulatory Visit: Payer: Self-pay | Admitting: Medical Oncology

## 2015-05-26 DIAGNOSIS — C7931 Secondary malignant neoplasm of brain: Secondary | ICD-10-CM

## 2015-05-26 DIAGNOSIS — C3491 Malignant neoplasm of unspecified part of right bronchus or lung: Secondary | ICD-10-CM

## 2015-05-26 MED ORDER — OXYCODONE HCL 5 MG PO TABS: 5.0000 mg | ORAL_TABLET | ORAL | Status: DC | PRN

## 2015-05-26 NOTE — Progress Notes (Signed)
Rx ready for pick up-voice message left for linda.

## 2015-05-29 ENCOUNTER — Telehealth: Payer: Self-pay | Admitting: *Deleted

## 2015-05-29 ENCOUNTER — Ambulatory Visit (HOSPITAL_COMMUNITY)
Admission: RE | Admit: 2015-05-29 | Discharge: 2015-05-29 | Disposition: A | Discharge status: Home / Self Care | Payer: Medicare Other | Source: Ambulatory Visit | Attending: Internal Medicine | Admitting: Internal Medicine

## 2015-05-29 ENCOUNTER — Encounter (HOSPITAL_COMMUNITY): Payer: Self-pay

## 2015-05-29 DIAGNOSIS — C7951 Secondary malignant neoplasm of bone: Secondary | ICD-10-CM | POA: Diagnosis not present

## 2015-05-29 DIAGNOSIS — C3491 Malignant neoplasm of unspecified part of right bronchus or lung: Secondary | ICD-10-CM | POA: Insufficient documentation

## 2015-05-29 DIAGNOSIS — I2699 Other pulmonary embolism without acute cor pulmonale: Secondary | ICD-10-CM | POA: Diagnosis not present

## 2015-05-29 DIAGNOSIS — C7971 Secondary malignant neoplasm of right adrenal gland: Secondary | ICD-10-CM | POA: Insufficient documentation

## 2015-05-29 DIAGNOSIS — L512 Toxic epidermal necrolysis [Lyell]: Secondary | ICD-10-CM | POA: Diagnosis present

## 2015-05-29 DIAGNOSIS — C787 Secondary malignant neoplasm of liver and intrahepatic bile duct: Secondary | ICD-10-CM | POA: Diagnosis not present

## 2015-05-29 DIAGNOSIS — C782 Secondary malignant neoplasm of pleura: Secondary | ICD-10-CM | POA: Insufficient documentation

## 2015-05-29 MED ORDER — IOHEXOL 300 MG/ML  SOLN
100.0000 mL | Freq: Once | INTRAMUSCULAR | Status: AC | PRN
  Administered 2015-05-29: 100 mL via INTRAVENOUS

## 2015-05-29 NOTE — Progress Notes (Signed)
Quick Note:  Call patient with the result. I will order Xarelto. ______

## 2015-05-29 NOTE — Telephone Encounter (Signed)
Per MD, pt has small clot in lung, will send Xerelto to pt's pharmacy. Called pt, unable to reach, no voice mail available. Rings only. Unable to leave message will call back at later time.

## 2015-06-02 ENCOUNTER — Telehealth: Payer: Self-pay | Admitting: Medical Oncology

## 2015-06-02 DIAGNOSIS — I2699 Other pulmonary embolism without acute cor pulmonale: Secondary | ICD-10-CM

## 2015-06-02 MED ORDER — RIVAROXABAN (XARELTO) VTE STARTER PACK (15 & 20 MG): ORAL_TABLET | ORAL | Status: DC

## 2015-06-02 NOTE — Telephone Encounter (Signed)
I left message on [name]'s phone re pt pulmonary emboli and to start xarelto.

## 2015-06-02 NOTE — Telephone Encounter (Signed)
I called in [place]

## 2015-06-07 ENCOUNTER — Other Ambulatory Visit (HOSPITAL_BASED_OUTPATIENT_CLINIC_OR_DEPARTMENT_OTHER): Payer: Medicare Other

## 2015-06-07 DIAGNOSIS — C3491 Malignant neoplasm of unspecified part of right bronchus or lung: Secondary | ICD-10-CM

## 2015-06-07 DIAGNOSIS — Z79899 Other long term (current) drug therapy: Secondary | ICD-10-CM

## 2015-06-07 DIAGNOSIS — L512 Toxic epidermal necrolysis [Lyell]: Secondary | ICD-10-CM

## 2015-06-07 LAB — COMPREHENSIVE METABOLIC PANEL (CC13)
ALT: 12 U/L (ref 0–55)
ANION GAP: 9 meq/L (ref 3–11)
AST: 17 U/L (ref 5–34)
Albumin: 3.4 g/dL — ABNORMAL LOW (ref 3.5–5.0)
Alkaline Phosphatase: 81 U/L (ref 40–150)
BUN: 14.7 mg/dL (ref 7.0–26.0)
CALCIUM: 10.2 mg/dL (ref 8.4–10.4)
CHLORIDE: 109 meq/L (ref 98–109)
CO2: 24 mEq/L (ref 22–29)
CREATININE: 0.7 mg/dL (ref 0.7–1.3)
Glucose: 102 mg/dl (ref 70–140)
Potassium: 3.4 mEq/L — ABNORMAL LOW (ref 3.5–5.1)
Sodium: 142 mEq/L (ref 136–145)
Total Bilirubin: 0.58 mg/dL (ref 0.20–1.20)
Total Protein: 6.9 g/dL (ref 6.4–8.3)

## 2015-06-07 LAB — CBC WITH DIFFERENTIAL/PLATELET
BASO%: 0.4 % (ref 0.0–2.0)
BASOS ABS: 0 10*3/uL (ref 0.0–0.1)
EOS%: 0.5 % (ref 0.0–7.0)
Eosinophils Absolute: 0 10*3/uL (ref 0.0–0.5)
HCT: 43.2 % (ref 38.4–49.9)
HGB: 15.1 g/dL (ref 13.0–17.1)
LYMPH#: 1.3 10*3/uL (ref 0.9–3.3)
LYMPH%: 23.6 % (ref 14.0–49.0)
MCH: 27.6 pg (ref 27.2–33.4)
MCHC: 35 g/dL (ref 32.0–36.0)
MCV: 78.8 fL — AB (ref 79.3–98.0)
MONO#: 0.4 10*3/uL (ref 0.1–0.9)
MONO%: 7.2 % (ref 0.0–14.0)
NEUT#: 3.9 10*3/uL (ref 1.5–6.5)
NEUT%: 68.3 % (ref 39.0–75.0)
PLATELETS: 235 10*3/uL (ref 140–400)
RBC: 5.48 10*6/uL (ref 4.20–5.82)
RDW: 19.5 % — AB (ref 11.0–14.6)
WBC: 5.7 10*3/uL (ref 4.0–10.3)

## 2015-06-07 LAB — TECHNOLOGIST REVIEW

## 2015-06-07 LAB — TSH CHCC: TSH: 1.871 m(IU)/L (ref 0.320–4.118)

## 2015-06-14 ENCOUNTER — Ambulatory Visit (HOSPITAL_BASED_OUTPATIENT_CLINIC_OR_DEPARTMENT_OTHER): Payer: Medicare Other | Admitting: Internal Medicine

## 2015-06-14 ENCOUNTER — Telehealth: Payer: Self-pay | Admitting: Internal Medicine

## 2015-06-14 ENCOUNTER — Telehealth: Payer: Self-pay | Admitting: *Deleted

## 2015-06-14 ENCOUNTER — Encounter: Payer: Self-pay | Admitting: Internal Medicine

## 2015-06-14 VITALS — BP 101/66 | HR 106 | Temp 97.5°F | Resp 18 | Ht 72.0 in | Wt 130.0 lb

## 2015-06-14 DIAGNOSIS — L120 Bullous pemphigoid: Secondary | ICD-10-CM | POA: Diagnosis not present

## 2015-06-14 DIAGNOSIS — C3411 Malignant neoplasm of upper lobe, right bronchus or lung: Secondary | ICD-10-CM | POA: Diagnosis present

## 2015-06-14 DIAGNOSIS — I2782 Chronic pulmonary embolism: Secondary | ICD-10-CM

## 2015-06-14 DIAGNOSIS — R918 Other nonspecific abnormal finding of lung field: Secondary | ICD-10-CM

## 2015-06-14 DIAGNOSIS — C7951 Secondary malignant neoplasm of bone: Secondary | ICD-10-CM

## 2015-06-14 DIAGNOSIS — C3491 Malignant neoplasm of unspecified part of right bronchus or lung: Secondary | ICD-10-CM

## 2015-06-14 DIAGNOSIS — C7931 Secondary malignant neoplasm of brain: Secondary | ICD-10-CM

## 2015-06-14 DIAGNOSIS — I2699 Other pulmonary embolism without acute cor pulmonale: Secondary | ICD-10-CM

## 2015-06-14 DIAGNOSIS — I2690 Septic pulmonary embolism without acute cor pulmonale: Secondary | ICD-10-CM

## 2015-06-14 DIAGNOSIS — F17200 Nicotine dependence, unspecified, uncomplicated: Secondary | ICD-10-CM

## 2015-06-14 MED ORDER — RIVAROXABAN 20 MG PO TABS: 20.0000 mg | ORAL_TABLET | Freq: Every day | ORAL | Status: DC

## 2015-06-14 MED ORDER — DEXAMETHASONE 4 MG PO TABS: 8.0000 mg | ORAL_TABLET | Freq: Two times a day (BID) | ORAL | Status: DC

## 2015-06-14 MED ORDER — OXYCODONE HCL 5 MG PO TABS: 5.0000 mg | ORAL_TABLET | ORAL | Status: DC | PRN

## 2015-06-14 MED ORDER — PROCHLORPERAZINE MALEATE 10 MG PO TABS: 10.0000 mg | ORAL_TABLET | Freq: Four times a day (QID) | ORAL | Status: AC | PRN

## 2015-06-14 NOTE — Telephone Encounter (Signed)
Per staff message and POF I have scheduled appts. Advised scheduler of appts and first available given. JMW  

## 2015-06-14 NOTE — Telephone Encounter (Signed)
per pof to sch pt trmt-sent MW email to sch trmt-gave pt copy of sch-pt aware of appts

## 2015-06-14 NOTE — Progress Notes (Signed)
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  PRIOR THERAPY:  1) Systemic chemotherapy with carboplatin AUC of 5 and Alimta 500 mg/m started on 09/05/2014. Status post 6 cycles discontinued secondary to disease progression. 2) Immunotherapy with Nivolumab 3 MG/KG every 2 weeks, status post 3 cycle.  CURRENT THERAPY: Systemic chemotherapy with docetaxel 60 MG/M2 and Cyramza 10 MG/KG every 3 weeks with Neulasta support. First dose 06/22/2015.  INTERVAL HISTORY: Brendan Hooper 69 y.o. male returns to the clinic today for follow-up visit accompanied by family member. 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 His skin rash has significantly improved and left few dark scars on the skin. This is concerning to be secondary to his treatment with Nivolumab which was discontinued. The patient is feeling much better today.Marland Kitchen He denied having any significant weight loss or night sweats. He denied having any chest pain, shortness breath, cough or hemoptysis. He has no nausea or vomiting. Has some pain on the left shoulder. He had repeat CT scan of the chest, abdomen and pelvis performed recently and he is here for evaluation and discussion of his scan results.  MEDICAL HISTORY: Past Medical History  Diagnosis Date  .  GERD (gastroesophageal reflux disease)   . Headache(784.0)   . Sickle cell anemia (HCC)   . Lung mass   . Hypertension   . Bronchogenic cancer of right lung ([facility]) 08/25/2014  . 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  MEDICATIONS:  Current Outpatient Prescriptions  Medication Sig Dispense Refill  . acetaminophen (TYLENOL) 325 MG tablet Take 2 tablets (650 mg total) by mouth every 6 (six) hours as needed for mild pain (or Fever >/= 101). (Patient not taking: Reported on 05/10/2015) 30 tablet 0  . alum & mag hydroxide-simeth (MAALOX/MYLANTA) 200-200-20 MG/5ML suspension Take 30 mLs by mouth every 6 (six) hours as needed for indigestion or heartburn (dyspepsia). 355 mL 0  . cholecalciferol (VITAMIN D) 1000 UNITS tablet Take 1,000 Units by mouth once a week.     Marland Kitchen dexamethasone (DECADRON) 4 MG tablet Take 4 mg by mouth 2 (two) times daily. Take 1 tablet the day before teh day of and the day after chemo.  0  . diphenhydrAMINE (BENADRYL) 25 MG tablet Take 25 mg by mouth every 6 (six) hours as needed.    . feeding supplement, ENSURE ENLIVE, (ENSURE ENLIVE) LIQD Take 237 mLs by mouth 3 (three) times daily between meals. (Patient taking differently: Take 237 mLs by mouth 2 (two) times a week. ) 237 mL 12  . fluocinonide cream (LIDEX) 0.05 %     . folic acid (FOLVITE) 1 MG tablet Take 1 tablet (1 mg total) by mouth daily. 30 tablet 0  . lidocaine-prilocaine (EMLA) cream Apply 1 application topically as needed. (Patient taking differently:  Apply 1 application topically as needed (port access). ) 30 g 1  . Multiple Vitamin (MULTIVITAMIN WITH MINERALS) TABS tablet Take 1 tablet by mouth daily. (Patient taking differently: Take 1 tablet by mouth once a week. ) 30 tablet 0  . ondansetron (ZOFRAN) 4 MG tablet Take 1 tablet (4 mg total) by mouth every 6 (six) hours as needed for nausea. 20 tablet 0  . oxyCODONE (OXY IR/ROXICODONE) 5 MG immediate  release tablet Take 1-2 tablets (5-10 mg total) by mouth every 4 (four) hours as needed for moderate pain. 60 tablet 0  . predniSONE (DELTASONE) 20 MG tablet     . prochlorperazine (COMPAZINE) 10 MG tablet Take 1 tablet (10 mg total) by mouth every 6 (six) hours as needed for nausea or vomiting. 60 tablet 1  . Rivaroxaban (XARELTO STARTER PACK) 15 & 20 MG TBPK Take as directed on package: Start with one '15mg'$  tablet by mouth twice a day with food. On Day 22, switch to one '20mg'$  tablet once a day with food. 51 each 0  . thiamine 100 MG tablet Take 1 tablet (100 mg total) by mouth daily. (Patient taking differently: Take 100 mg by mouth once a week. ) 30 tablet 0   No current facility-administered medications for this visit.   Facility-Administered Medications Ordered in Other Visits  Medication Dose Route Frequency Provider Last Rate Last Dose  . sodium chloride 0.9 % injection 10 mL  10 mL Intravenous PRN Curt Bears, MD   10 mL at 02/16/15 1756    SURGICAL HISTORY:  Past Surgical History  Procedure Laterality Date  . No past surgeries    . Portacath placement Left 09/01/2014    Procedure: INSERTION PORT-A-CATH;  Surgeon: Melrose Nakayama, MD;  Location: [facility];  Service: Thoracic;  Laterality: Left;    REVIEW OF SYSTEMS:  Constitutional: negative Eyes: negative Ears, nose, mouth, throat, and face: negative Respiratory: negative Cardiovascular: negative Gastrointestinal: negative Genitourinary:negative Integument/breast: positive for Resolving skin rash Hematologic/lymphatic: negative Musculoskeletal:positive for bone pain Neurological: negative Behavioral/Psych: negative Endocrine: negative Allergic/Immunologic: negative   PHYSICAL EXAMINATION: General appearance: alert, cooperative, fatigued and no distress Head: Normocephalic, without obvious abnormality, atraumatic Neck: no adenopathy, no JVD, supple, symmetrical, trachea midline and thyroid not enlarged, symmetric, no  tenderness/mass/nodules Lymph nodes: Cervical, supraclavicular, and axillary nodes normal. Resp: clear to auscultation bilaterally Back: symmetric, no curvature. ROM normal. No CVA tenderness. Cardio: regular rate and rhythm, S1, S2 normal, no murmur, click, rub or gallop GI: soft, non-tender; bowel sounds normal; no masses,  no organomegaly Extremities: extremities normal, atraumatic, no cyanosis or edema Neurologic: Alert and oriented X 3, normal strength and tone. Normal symmetric reflexes. Normal coordination and gait  Skin exam: Dark dried lesion consistent with resolving skin rash.  ECOG PERFORMANCE STATUS: 2 - Symptomatic, <50% confined to bed  Blood pressure 101/66, pulse 106, temperature 97.5 F (36.4 C), temperature source Oral, resp. rate 18, height 6' (1.829 m), weight 130 lb (58.968 kg), SpO2 100 %.  LABORATORY DATA: Lab Results  Component Value Date   WBC 5.7 06/07/2015   HGB 15.1 06/07/2015   HCT 43.2 06/07/2015   MCV 78.8* 06/07/2015   PLT 235 06/07/2015      Chemistry      Component Value Date/Time   NA 142 06/07/2015 1001   NA 137 03/27/2015 1952   K 3.4* 06/07/2015 1001   K 2.8* 03/27/2015 1952   CL 100* 03/27/2015 1952   CO2 24 06/07/2015 1001   CO2  27 03/27/2015 1952   BUN 14.7 06/07/2015 1001   BUN 9 03/27/2015 1952   CREATININE 0.7 06/07/2015 1001   CREATININE 0.58* 03/27/2015 1952      Component Value Date/Time   CALCIUM 10.2 06/07/2015 1001   CALCIUM 10.0 03/27/2015 1952   ALKPHOS 81 06/07/2015 1001   ALKPHOS 88 03/27/2015 1952   AST 17 06/07/2015 1001   AST 24 03/27/2015 1952   ALT 12 06/07/2015 1001   ALT 11* 03/27/2015 1952   BILITOT 0.58 06/07/2015 1001   BILITOT 0.1* 03/27/2015 1952       RADIOGRAPHIC STUDIES: Ct Chest W Contrast  05/29/2015  CLINICAL DATA:  Right lung cancer with brain metastases, chemotherapy ongoing EXAM: CT CHEST, ABDOMEN, AND PELVIS WITH CONTRAST TECHNIQUE: Multidetector CT imaging of the chest, abdomen and  pelvis was performed following the standard protocol during bolus administration of intravenous contrast. CONTRAST:  172m OMNIPAQUE IOHEXOL 300 MG/ML  SOLN COMPARISON:  01/27/2015 FINDINGS: CT CHEST FINDINGS Although not tailored for evaluation of the pulmonary arteries, there are moderate lobar/segmental pulmonary emboli within the left upper lobe, left lower lobe, and lingular pulmonary arteries (series 2/images 29, 36, and 39). No convincing secondary findings to suggest right heart strain. Mediastinum/Nodes: The heart is normal in size. No pericardial effusion. Coronary atherosclerosis. Atherosclerotic calcifications of the aortic arch. Left chest port terminates at the cavoatrial junction. 1.9 cm short axis subcarinal node (series 2/ image 32), previously 1.8 cm. Lungs/Pleura: 5.7 x 7.4 cm right middle lobe/perihilar mass extending to the right hilum (series 4/ image 32), grossly unchanged. Mass appears to traverse the right minor fissure and extend into the right upper lobe. Associated anterior pleural retraction (series 4/image 30). Additional pleural-based nodularity/mass along the posterior right upper lobe, now measuring 1.7 x 4.5 cm (series 4/image 28), decreased. Additional satellite nodularity/metastases in the right middle lobe (series 4/image 42), measuring up to 4 mm, new. Increased interstitial markings with patchy opacity posterior in the posterior right upper and lower lobes (series 4/image is 25 and 33), new, worrisome for lymphangitic spread of tumor, less likely infection. Trace right pleural effusion.  Left lung is clear. Underlying mild centrilobular and paraseptal emphysematous changes. No pneumothorax. Musculoskeletal: 2.0 x 2.2 cm lytic metastasis involving the left T2 vertebral body (series 2/ image 6), increased. 1.7 x 1.0 cm sclerotic metastasis involving the left T4 vertebral body (series 2/ image 13), new/increased. CT ABDOMEN PELVIS FINDINGS Hepatobiliary: 1.9 x 2.1 cm metastasis in  the posterior right hepatic dome (series 2/ image 57), decreased, previously 3.0 x 3.6 cm. Additional scattered subcentimeter cysts are unchanged. Gallbladder is unremarkable. No intrahepatic or extrahepatic ductal dilatation. Pancreas: Within normal limits. Spleen: Within normal limits. Adrenals/Urinary Tract: 2.8 x 2.3 cm right adrenal metastasis, previously 3.9 x 2.7 cm. 2.0 x 1.6 cm left adrenal metastasis, previously 1.4 x 1.3 cm. Bilateral renal cysts, measuring up to 3.3 cm on the right. No hydronephrosis. Bladder is mildly thick-walled although underdistended. Stomach/Bowel: Stomach is within normal limits. No evidence of bowel obstruction. Normal appendix. Vascular/Lymphatic: Atherosclerotic calcifications of the abdominal aorta and branch vessels. 1.6 cm short axis right inguinal node (series 2/ image 128), previously 2.6 cm. Reproductive: Enlargement/ nodularity of the central gland of the prostate, which indents the base the bladder. Other: No abdominopelvic ascites. Musculoskeletal: 3.8 x 3.5 cm lytic metastasis arising from the left L1 vertebral body (series 2/image 60), previously 2.8 x 2.9 cm. Additional 3.1 x 4.2 cm mixed lytic/sclerotic metastasis with soft tissue component arising from the  left L4 vertebral body (series 2/ image 84), increased. Two L5 metastases (series 2/ image 90), increased. IMPRESSION: Moderate lobar/segmental left pulmonary emboli, as above. 7.4 cm right middle lobe/perihilar mass, grossly unchanged. Associated right pleural metastases, grossly unchanged. New right middle lobe nodularity/metastases measuring up to 4 mm. Suspected lymphangitic spread into the right upper and right lower lobes, new. Subcarinal nodal metastasis and left adrenal metastasis are grossly unchanged versus mildly increased. Hepatic metastasis, right adrenal metastasis, and right inguinal nodal metastasis are improved. Progression of osseous metastases, as above. Critical Value/emergent results were  called by telephone at the time of interpretation on 05/29/2015 at 8:42 am to Dr. Curt Bears, who verbally acknowledged these results. Electronically Signed   By: Julian Hy M.D.   On: 05/29/2015 09:02   Ct Abdomen Pelvis W Contrast  05/29/2015  CLINICAL DATA:  Right lung cancer with brain metastases, chemotherapy ongoing EXAM: CT CHEST, ABDOMEN, AND PELVIS WITH CONTRAST TECHNIQUE: Multidetector CT imaging of the chest, abdomen and pelvis was performed following the standard protocol during bolus administration of intravenous contrast. CONTRAST:  122m OMNIPAQUE IOHEXOL 300 MG/ML  SOLN COMPARISON:  01/27/2015 FINDINGS: CT CHEST FINDINGS Although not tailored for evaluation of the pulmonary arteries, there are moderate lobar/segmental pulmonary emboli within the left upper lobe, left lower lobe, and lingular pulmonary arteries (series 2/images 29, 36, and 39). No convincing secondary findings to suggest right heart strain. Mediastinum/Nodes: The heart is normal in size. No pericardial effusion. Coronary atherosclerosis. Atherosclerotic calcifications of the aortic arch. Left chest port terminates at the cavoatrial junction. 1.9 cm short axis subcarinal node (series 2/ image 32), previously 1.8 cm. Lungs/Pleura: 5.7 x 7.4 cm right middle lobe/perihilar mass extending to the right hilum (series 4/ image 32), grossly unchanged. Mass appears to traverse the right minor fissure and extend into the right upper lobe. Associated anterior pleural retraction (series 4/image 30). Additional pleural-based nodularity/mass along the posterior right upper lobe, now measuring 1.7 x 4.5 cm (series 4/image 28), decreased. Additional satellite nodularity/metastases in the right middle lobe (series 4/image 42), measuring up to 4 mm, new. Increased interstitial markings with patchy opacity posterior in the posterior right upper and lower lobes (series 4/image is 25 and 33), new, worrisome for lymphangitic spread of tumor,  less likely infection. Trace right pleural effusion.  Left lung is clear. Underlying mild centrilobular and paraseptal emphysematous changes. No pneumothorax. Musculoskeletal: 2.0 x 2.2 cm lytic metastasis involving the left T2 vertebral body (series 2/ image 6), increased. 1.7 x 1.0 cm sclerotic metastasis involving the left T4 vertebral body (series 2/ image 13), new/increased. CT ABDOMEN PELVIS FINDINGS Hepatobiliary: 1.9 x 2.1 cm metastasis in the posterior right hepatic dome (series 2/ image 57), decreased, previously 3.0 x 3.6 cm. Additional scattered subcentimeter cysts are unchanged. Gallbladder is unremarkable. No intrahepatic or extrahepatic ductal dilatation. Pancreas: Within normal limits. Spleen: Within normal limits. Adrenals/Urinary Tract: 2.8 x 2.3 cm right adrenal metastasis, previously 3.9 x 2.7 cm. 2.0 x 1.6 cm left adrenal metastasis, previously 1.4 x 1.3 cm. Bilateral renal cysts, measuring up to 3.3 cm on the right. No hydronephrosis. Bladder is mildly thick-walled although underdistended. Stomach/Bowel: Stomach is within normal limits. No evidence of bowel obstruction. Normal appendix. Vascular/Lymphatic: Atherosclerotic calcifications of the abdominal aorta and branch vessels. 1.6 cm short axis right inguinal node (series 2/ image 128), previously 2.6 cm. Reproductive: Enlargement/ nodularity of the central gland of the prostate, which indents the base the bladder. Other: No abdominopelvic ascites. Musculoskeletal: 3.8 x 3.5 cm  lytic metastasis arising from the left L1 vertebral body (series 2/image 60), previously 2.8 x 2.9 cm. Additional 3.1 x 4.2 cm mixed lytic/sclerotic metastasis with soft tissue component arising from the left L4 vertebral body (series 2/ image 84), increased. Two L5 metastases (series 2/ image 90), increased. IMPRESSION: Moderate lobar/segmental left pulmonary emboli, as above. 7.4 cm right middle lobe/perihilar mass, grossly unchanged. Associated right pleural  metastases, grossly unchanged. New right middle lobe nodularity/metastases measuring up to 4 mm. Suspected lymphangitic spread into the right upper and right lower lobes, new. Subcarinal nodal metastasis and left adrenal metastasis are grossly unchanged versus mildly increased. Hepatic metastasis, right adrenal metastasis, and right inguinal nodal metastasis are improved. Progression of osseous metastases, as above. Critical Value/emergent results were called by telephone at the time of interpretation on 05/29/2015 at 8:42 am to Dr. Curt Bears, who verbally acknowledged these results. Electronically Signed   By: Julian Hy M.D.   On: 05/29/2015 09:02    ASSESSMENT AND PLAN: This is a very pleasant 69 years old [place] American male with history of stage IV non-small cell lung cancer, adenocarcinoma and currently undergoing systemic chemotherapy with carboplatin and Alimta status post 6 cycles. This was discontinued secondary to disease progression. The patient underwent treatment with immunotherapy with Nivolumab status post 3 cycles and tolerating his treatment well except for the severe skin rash and the biopsy confirms bullous pemphigoid. The patient skin lesions are resolving. The recent CT scan of the chest, abdomen and pelvis showed progression of osseous metastasis but there was improvement in the hepatic and right adrenal metastasis as well as the right inguinal nodal metastasis. Unfortunately the patient was unable to tolerated the previous treatment with immunotherapy with Nivolumab. I discussed with him several other options for treatment of his condition including palliative care and hospice referral versus consideration of second line systemic chemotherapy with reduced dose docetaxel and Cyramza. The patient is interested in proceeding with systemic chemotherapy. He will be treated with docetaxel 60 MG/M2 in addition to Cyramza 10 mg/KG every 3 weeks with Neulasta support. He is  expected to start the first cycle of this treatment next week. I discussed with the patient adverse effect of this treatment including but not limited to alopecia, myelosuppression, nausea and vomiting, peripheral neuropathy, liver or renal dysfunction. I will call his pharmacy was prescription for Decadron 8 mg by mouth twice a day the day before, day of and day after the chemotherapy every 3 weeks. The patient was also given a refill for his pain medication was OxyIR in addition to a refill of Xarelto for treatment of the pulmonary embolism. He would come back for follow-up visit in 4 weeks for reevaluation before the start of cycle #2 of his treatment. He was advised to call immediately if he has any concerning symptoms in the interval.  The patient voices understanding of current disease status and treatment options and is in agreement with the current care plan.  All questions were answered. The patient knows to call the clinic with any problems, questions or concerns. We can certainly see the patient much sooner if necessary.  Disclaimer: This note was dictated with voice recognition software. Similar sounding words can inadvertently be transcribed and may not be corrected upon review.

## 2015-06-22 ENCOUNTER — Other Ambulatory Visit: Payer: Self-pay | Admitting: *Deleted

## 2015-06-22 ENCOUNTER — Ambulatory Visit (HOSPITAL_BASED_OUTPATIENT_CLINIC_OR_DEPARTMENT_OTHER): Payer: Medicare Other

## 2015-06-22 ENCOUNTER — Other Ambulatory Visit: Payer: Medicare Other | Admitting: *Deleted

## 2015-06-22 ENCOUNTER — Telehealth: Payer: Self-pay | Admitting: Medical Oncology

## 2015-06-22 ENCOUNTER — Other Ambulatory Visit (HOSPITAL_BASED_OUTPATIENT_CLINIC_OR_DEPARTMENT_OTHER): Payer: Medicare Other

## 2015-06-22 VITALS — BP 111/72 | HR 94 | Temp 98.2°F | Resp 20

## 2015-06-22 DIAGNOSIS — Z5112 Encounter for antineoplastic immunotherapy: Secondary | ICD-10-CM | POA: Diagnosis present

## 2015-06-22 DIAGNOSIS — C7951 Secondary malignant neoplasm of bone: Secondary | ICD-10-CM | POA: Diagnosis not present

## 2015-06-22 DIAGNOSIS — C3411 Malignant neoplasm of upper lobe, right bronchus or lung: Secondary | ICD-10-CM

## 2015-06-22 DIAGNOSIS — C3491 Malignant neoplasm of unspecified part of right bronchus or lung: Secondary | ICD-10-CM

## 2015-06-22 DIAGNOSIS — Z5111 Encounter for antineoplastic chemotherapy: Secondary | ICD-10-CM

## 2015-06-22 DIAGNOSIS — Z79899 Other long term (current) drug therapy: Secondary | ICD-10-CM

## 2015-06-22 DIAGNOSIS — E876 Hypokalemia: Secondary | ICD-10-CM

## 2015-06-22 DIAGNOSIS — I959 Hypotension, unspecified: Secondary | ICD-10-CM

## 2015-06-22 LAB — CBC WITH DIFFERENTIAL/PLATELET
BASO%: 0.6 % (ref 0.0–2.0)
BASOS ABS: 0 10*3/uL (ref 0.0–0.1)
EOS%: 0.4 % (ref 0.0–7.0)
Eosinophils Absolute: 0 10*3/uL (ref 0.0–0.5)
HEMATOCRIT: 40.4 % (ref 38.4–49.9)
HEMOGLOBIN: 13.8 g/dL (ref 13.0–17.1)
LYMPH#: 1.1 10*3/uL (ref 0.9–3.3)
LYMPH%: 15.4 % (ref 14.0–49.0)
MCH: 27.3 pg (ref 27.2–33.4)
MCHC: 34.1 g/dL (ref 32.0–36.0)
MCV: 79.9 fL (ref 79.3–98.0)
MONO#: 0.6 10*3/uL (ref 0.1–0.9)
MONO%: 8 % (ref 0.0–14.0)
NEUT#: 5.3 10*3/uL (ref 1.5–6.5)
NEUT%: 75.6 % — ABNORMAL HIGH (ref 39.0–75.0)
Platelets: 250 10*3/uL (ref 140–400)
RBC: 5.06 10*6/uL (ref 4.20–5.82)
RDW: 19.4 % — AB (ref 11.0–14.6)
WBC: 7 10*3/uL (ref 4.0–10.3)

## 2015-06-22 LAB — COMPREHENSIVE METABOLIC PANEL (CC13)
ALT: <9 U/L (ref 0–55)
ANION GAP: 7 meq/L (ref 3–11)
AST: 17 U/L (ref 5–34)
Albumin: 3 g/dL — ABNORMAL LOW (ref 3.5–5.0)
Alkaline Phosphatase: 71 U/L (ref 40–150)
BUN: 8.4 mg/dL (ref 7.0–26.0)
CHLORIDE: 103 meq/L (ref 98–109)
CO2: 31 meq/L — AB (ref 22–29)
Calcium: 10.1 mg/dL (ref 8.4–10.4)
Creatinine: 0.6 mg/dL — ABNORMAL LOW (ref 0.7–1.3)
GLUCOSE: 109 mg/dL (ref 70–140)
POTASSIUM: 2.7 meq/L — AB (ref 3.5–5.1)
SODIUM: 141 meq/L (ref 136–145)
Total Bilirubin: 0.49 mg/dL (ref 0.20–1.20)
Total Protein: 6.4 g/dL (ref 6.4–8.3)

## 2015-06-22 LAB — UA PROTEIN, DIPSTICK - CHCC

## 2015-06-22 LAB — TECHNOLOGIST REVIEW

## 2015-06-22 MED ORDER — SODIUM CHLORIDE 0.9 % IV SOLN
Freq: Once | INTRAVENOUS | Status: AC
Start: 2015-06-22 — End: 2015-06-22
  Administered 2015-06-22: 12:00:00 via INTRAVENOUS

## 2015-06-22 MED ORDER — POTASSIUM CHLORIDE 10 MEQ/100ML IV SOLN: 10.0000 meq | INTRAVENOUS | Status: DC

## 2015-06-22 MED ORDER — SODIUM CHLORIDE 0.9 % IJ SOLN
10.0000 mL | INTRAMUSCULAR | Status: DC | PRN
  Administered 2015-06-22: 10 mL
  Filled 2015-06-22: qty 10

## 2015-06-22 MED ORDER — SODIUM CHLORIDE 0.9 % IV SOLN
Freq: Once | INTRAVENOUS | Status: AC
  Administered 2015-06-22: 15:00:00 via INTRAVENOUS
  Filled 2015-06-22: qty 4

## 2015-06-22 MED ORDER — ACETAMINOPHEN 325 MG PO TABS
ORAL_TABLET | ORAL | Status: AC
  Filled 2015-06-22: qty 2

## 2015-06-22 MED ORDER — ACETAMINOPHEN 325 MG PO TABS
650.0000 mg | ORAL_TABLET | Freq: Once | ORAL | Status: AC
  Administered 2015-06-22: 650 mg via ORAL

## 2015-06-22 MED ORDER — DOCETAXEL CHEMO INJECTION 160 MG/16ML
60.0000 mg/m2 | Freq: Once | INTRAVENOUS | Status: AC
  Administered 2015-06-22: 100 mg via INTRAVENOUS
  Filled 2015-06-22: qty 10

## 2015-06-22 MED ORDER — SODIUM CHLORIDE 0.9 % IV SOLN
Freq: Once | INTRAVENOUS | Status: AC
  Administered 2015-06-22: 11:00:00 via INTRAVENOUS
  Filled 2015-06-22: qty 500

## 2015-06-22 MED ORDER — SODIUM CHLORIDE 0.9 % IV SOLN
Freq: Once | INTRAVENOUS | Status: AC
  Administered 2015-06-22: 11:00:00 via INTRAVENOUS

## 2015-06-22 MED ORDER — DIPHENHYDRAMINE HCL 50 MG/ML IJ SOLN
INTRAMUSCULAR | Status: AC
  Filled 2015-06-22: qty 1

## 2015-06-22 MED ORDER — DIPHENHYDRAMINE HCL 50 MG/ML IJ SOLN
50.0000 mg | Freq: Once | INTRAMUSCULAR | Status: AC
  Administered 2015-06-22: 50 mg via INTRAVENOUS

## 2015-06-22 MED ORDER — POTASSIUM CHLORIDE CRYS ER 20 MEQ PO TBCR: 20.0000 meq | EXTENDED_RELEASE_TABLET | Freq: Two times a day (BID) | ORAL | Status: DC

## 2015-06-22 MED ORDER — HEPARIN SOD (PORK) LOCK FLUSH 100 UNIT/ML IV SOLN
500.0000 [IU] | Freq: Once | INTRAVENOUS | Status: AC | PRN
  Administered 2015-06-22: 500 [IU]
  Filled 2015-06-22: qty 5

## 2015-06-22 MED ORDER — RAMUCIRUMAB CHEMO INJECTION 500 MG/50ML
10.0000 mg/kg | Freq: Once | INTRAVENOUS | Status: AC
  Administered 2015-06-22: 600 mg via INTRAVENOUS
  Filled 2015-06-22: qty 50

## 2015-06-22 NOTE — Telephone Encounter (Signed)
KDur called in 40 meq/day x 7 days . [name] S.notified

## 2015-06-22 NOTE — Progress Notes (Signed)
Notified Dr [name] of low K+.  Received verbal order to give 40 meq of KCL IV.  Order repeated & verified. BP low before start of treatments, rechecked before starting cyramza & BP was 86/52.  Informed Dr [name] & bolus of 500 ml NS ordered.  Order repeated & confirmed.

## 2015-06-22 NOTE — Patient Instructions (Signed)
Ramucirumab injection What is this medicine? RAMUCIRUMAB (ra mue SIR ue mab) is a monoclonal antibody. It is used to treat stomach cancer, colorectal cancer, or lung cancer. This medicine may be used for other purposes; ask your health care provider or pharmacist if you have questions. What should I tell my health care provider before I take this medicine? They need to know if you have any of these conditions: -bleeding disorders -blood clots -heart disease, including heart failure, heart attack, or chest pain (angina) -high blood pressure -infection (especially a virus infection such as chickenpox, cold sores, or herpes) -protein in your urine -recent surgery -stroke -an unusual or allergic reaction to ramucirumab, other medicines, foods, dyes, or preservatives -pregnant or trying to get pregnant -breast-feeding How should I use this medicine? This medicine is for infusion into a vein. It is given by a health care professional in a hospital or clinic setting. Talk to your pediatrician regarding the use of this medicine in children. Special care may be needed. Overdosage: If you think you have taken too much of this medicine contact a poison control center or emergency room at once. NOTE: This medicine is only for you. Do not share this medicine with others. What if I miss a dose? It is important not to miss your dose. Call your doctor or health care professional if you are unable to keep an appointment. What may interact with this medicine? Interactions have not been studied. This list may not describe all possible interactions. Give your health care provider a list of all the medicines, herbs, non-prescription drugs, or dietary supplements you use. Also tell them if you smoke, drink alcohol, or use illegal drugs. Some items may interact with your medicine. What should I watch for while using this medicine? Your condition will be monitored carefully while you are receiving this medicine.  You will need to to check your blood pressure and have your blood and urine tested while you are taking this medicine. Your condition will be monitored carefully while you are receiving this medicine. This medicine may increase your risk to bruise or bleed. Call your doctor or health care professional if you notice any unusual bleeding. This medicine may rarely cause 'gastrointestinal perforation' (holes in the stomach, intestines or colon), a serious side effect requiring surgery to repair. This medicine should be started at least 28 days following major surgery and the site of the surgery should be totally healed. Check with your doctor before scheduling dental work or surgery while you are receiving this treatment. Talk to your doctor if you have recently had surgery or if you have a wound that has not healed. Do not become pregnant while taking this medicine or for 3 months after stopping it. Women should inform their doctor if they wish to become pregnant or think they might be pregnant. There is a potential for serious side effects to an unborn child. Talk to your health care professional or pharmacist for more information. What side effects may I notice from receiving this medicine? Side effects that you should report to your doctor or health care professional as soon as possible: -allergic reactions like skin rash, itching or hives, breathing problems, swelling of the face, lips, or tongue -signs of infection - fever or chills, cough, sore throat -chest pain or chest tightness -confusion -dizziness -feeling faint or lightheaded, falls -severe abdominal pain -severe nausea, vomiting -signs and symptoms of bleeding such as bloody or black, tarry stools; red or dark-brown urine; spitting up blood  or brown material that looks like coffee grounds; red spots on the skin; unusual bruising or bleeding from the eye, gums, or nose -signs and symptoms of a blood clot such as breathing problems; changes  in vision; chest pain; severe, sudden headache; pain, swelling, warmth in the leg; trouble speaking; sudden numbness or weakness of the face, arm or leg -symptoms of a stroke: change in mental awareness, inability to talk or move one side of the body -trouble walking, dizziness, loss of balance or coordination Side effects that usually do not require medical attention (Report these to your doctor or health care professional if they continue or are bothersome.): -cold, clammy skin -constipation -diarrhea -headache -nausea, vomiting -stomach pain -unusually slow heartbeat -unusually weak or tired This list may not describe all possible side effects. Call your doctor for medical advice about side effects. You may report side effects to FDA at 1-800-FDA-1088. Where should I keep my medicine? This drug is given in a hospital or clinic and will not be stored at home. NOTE: This sheet is a summary. It may not cover all possible information. If you have questions about this medicine, talk to your doctor, pharmacist, or health care provider.    2016, Elsevier/Gold Standard. (2014-10-04 17:37:19)Docetaxel injection What is this medicine? DOCETAXEL (doe se TAX el) is a chemotherapy drug. It targets fast dividing cells, like cancer cells, and causes these cells to die. This medicine is used to treat many types of cancers like breast cancer, certain stomach cancers, head and neck cancer, lung cancer, and prostate cancer. This medicine may be used for other purposes; ask your health care provider or pharmacist if you have questions. What should I tell my health care provider before I take this medicine? They need to know if you have any of these conditions: -infection (especially a virus infection such as chickenpox, cold sores, or herpes) -liver disease -low blood counts, like low white cell, platelet, or red cell counts -an unusual or allergic reaction to docetaxel, polysorbate 80, other chemotherapy  agents, other medicines, foods, dyes, or preservatives -pregnant or trying to get pregnant -breast-feeding How should I use this medicine? This drug is given as an infusion into a vein. It is administered in a hospital or clinic by a specially trained health care professional. Talk to your pediatrician regarding the use of this medicine in children. Special care may be needed. Overdosage: If you think you have taken too much of this medicine contact a poison control center or emergency room at once. NOTE: This medicine is only for you. Do not share this medicine with others. What if I miss a dose? It is important not to miss your dose. Call your doctor or health care professional if you are unable to keep an appointment. What may interact with this medicine? -cyclosporine -erythromycin -ketoconazole -medicines to increase blood counts like filgrastim, pegfilgrastim, sargramostim -vaccines Talk to your doctor or health care professional before taking any of these medicines: -acetaminophen -aspirin -ibuprofen -ketoprofen -naproxen This list may not describe all possible interactions. Give your health care provider a list of all the medicines, herbs, non-prescription drugs, or dietary supplements you use. Also tell them if you smoke, drink alcohol, or use illegal drugs. Some items may interact with your medicine. What should I watch for while using this medicine? Your condition will be monitored carefully while you are receiving this medicine. You will need important blood work done while you are taking this medicine. This drug may make you feel generally  unwell. This is not uncommon, as chemotherapy can affect healthy cells as well as cancer cells. Report any side effects. Continue your course of treatment even though you feel ill unless your doctor tells you to stop. In some cases, you may be given additional medicines to help with side effects. Follow all directions for their use. Call  your doctor or health care professional for advice if you get a fever, chills or sore throat, or other symptoms of a cold or flu. Do not treat yourself. This drug decreases your body's ability to fight infections. Try to avoid being around people who are sick. This medicine may increase your risk to bruise or bleed. Call your doctor or health care professional if you notice any unusual bleeding. This medicine may contain alcohol in the product. You may get drowsy or dizzy. Do not drive, use machinery, or do anything that needs mental alertness until you know how this medicine affects you. Do not stand or sit up quickly, especially if you are an older patient. This reduces the risk of dizzy or fainting spells. Avoid alcoholic drinks. Do not become pregnant while taking this medicine. Women should inform their doctor if they wish to become pregnant or think they might be pregnant. There is a potential for serious side effects to an unborn child. Talk to your health care professional or pharmacist for more information. Do not breast-feed an infant while taking this medicine. What side effects may I notice from receiving this medicine? Side effects that you should report to your doctor or health care professional as soon as possible: -allergic reactions like skin rash, itching or hives, swelling of the face, lips, or tongue -low blood counts - This drug may decrease the number of white blood cells, red blood cells and platelets. You may be at increased risk for infections and bleeding. -signs of infection - fever or chills, cough, sore throat, pain or difficulty passing urine -signs of decreased platelets or bleeding - bruising, pinpoint red spots on the skin, black, tarry stools, nosebleeds -signs of decreased red blood cells - unusually weak or tired, fainting spells, lightheadedness -breathing problems -fast or irregular heartbeat -low blood pressure -mouth sores -nausea and vomiting -pain, swelling,  redness or irritation at the injection site -pain, tingling, numbness in the hands or feet -swelling of the ankle, feet, hands -weight gain Side effects that usually do not require medical attention (report to your prescriber or health care professional if they continue or are bothersome): -bone pain -complete hair loss including hair on your head, underarms, pubic hair, eyebrows, and eyelashes -diarrhea -excessive tearing -changes in the color of fingernails -loosening of the fingernails -nausea -muscle pain -red flush to skin -sweating -weak or tired This list may not describe all possible side effects. Call your doctor for medical advice about side effects. You may report side effects to FDA at 1-800-FDA-1088. Where should I keep my medicine? This drug is given in a hospital or clinic and will not be stored at home. NOTE: This sheet is a summary. It may not cover all possible information. If you have questions about this medicine, talk to your doctor, pharmacist, or health care provider.    2016, Elsevier/Gold Standard. (2014-08-22 16:04:57) [facility] Discharge Instructions for Patients Receiving Chemotherapy  Today you received the following chemotherapy agents:  Cyramza, & Taxotere  To help prevent nausea and vomiting after your treatment, we encourage you to take your nausea medication If you develop nausea and vomiting that  is not controlled by your nausea medication, call the clinic.   BELOW ARE SYMPTOMS THAT SHOULD BE REPORTED IMMEDIATELY:  *FEVER GREATER THAN 100.5 F  *CHILLS WITH OR WITHOUT FEVER  NAUSEA AND VOMITING THAT IS NOT CONTROLLED WITH YOUR NAUSEA MEDICATION  *UNUSUAL SHORTNESS OF BREATH  *UNUSUAL BRUISING OR BLEEDING  TENDERNESS IN MOUTH AND THROAT WITH OR WITHOUT PRESENCE OF ULCERS  *URINARY PROBLEMS  *BOWEL PROBLEMS  UNUSUAL RASH Items with * indicate a potential emergency and should be followed up as soon as possible.  Feel  free to call the clinic you have any questions or concerns. The clinic phone number is [phone].  Please show the [place] [facility] at check-in to the Emergency Department and triage nurse.

## 2015-06-23 ENCOUNTER — Telehealth: Payer: Self-pay | Admitting: *Deleted

## 2015-06-23 NOTE — Telephone Encounter (Signed)
-----   Message from Ignacia Felling, RN sent at 06/22/2015  4:39 PM EDT ----- Regarding: Dr. Tyrell Antonio  chemo follow up call 1st taxotere,  Patient phone [phone]

## 2015-06-23 NOTE — Telephone Encounter (Signed)
Called pt spoke with Brendan Hooper who advised she will check on him later this evening and will call office with any concerns.

## 2015-06-24 ENCOUNTER — Ambulatory Visit (HOSPITAL_BASED_OUTPATIENT_CLINIC_OR_DEPARTMENT_OTHER): Payer: Medicare Other

## 2015-06-24 VITALS — BP 134/77 | HR 96 | Temp 98.4°F | Resp 20

## 2015-06-24 DIAGNOSIS — Z5189 Encounter for other specified aftercare: Secondary | ICD-10-CM | POA: Diagnosis not present

## 2015-06-24 DIAGNOSIS — C3411 Malignant neoplasm of upper lobe, right bronchus or lung: Secondary | ICD-10-CM | POA: Diagnosis present

## 2015-06-24 MED ORDER — PEGFILGRASTIM INJECTION 6 MG/0.6ML ~~LOC~~
6.0000 mg | PREFILLED_SYRINGE | Freq: Once | SUBCUTANEOUS | Status: AC
  Administered 2015-06-24: 6 mg via SUBCUTANEOUS

## 2015-06-26 ENCOUNTER — Telehealth: Payer: Self-pay | Admitting: *Deleted

## 2015-06-26 NOTE — Telephone Encounter (Signed)
Called pt to check on him after chemotherapy on 06/22/15.  Pt tol. Well, no c/o.  Reminded on next appts & informed to call if any questions or concerns.  Pt expressed understanding.

## 2015-06-26 NOTE — Telephone Encounter (Signed)
-----   Message from Ignacia Felling, RN sent at 06/22/2015  4:39 PM EDT ----- Regarding: Dr. Tyrell Antonio  chemo follow up call 1st taxotere,  Patient phone [phone]

## 2015-06-26 NOTE — Telephone Encounter (Signed)
-----   Message from Ignacia Felling, RN sent at 06/22/2015  4:39 PM EDT ----- Regarding: Dr. Tyrell Antonio  chemo follow up call 1st taxotere,  Patient phone [phone]

## 2015-06-29 ENCOUNTER — Ambulatory Visit (HOSPITAL_BASED_OUTPATIENT_CLINIC_OR_DEPARTMENT_OTHER): Payer: Medicare Other

## 2015-06-29 DIAGNOSIS — C3411 Malignant neoplasm of upper lobe, right bronchus or lung: Secondary | ICD-10-CM | POA: Diagnosis not present

## 2015-06-29 DIAGNOSIS — C3491 Malignant neoplasm of unspecified part of right bronchus or lung: Secondary | ICD-10-CM

## 2015-06-29 LAB — CBC WITH DIFFERENTIAL/PLATELET
BASO%: 0.2 % (ref 0.0–2.0)
BASOS ABS: 0.1 10*3/uL (ref 0.0–0.1)
EOS%: 0 % (ref 0.0–7.0)
Eosinophils Absolute: 0 10*3/uL (ref 0.0–0.5)
HCT: 38.2 % — ABNORMAL LOW (ref 38.4–49.9)
HEMOGLOBIN: 13.6 g/dL (ref 13.0–17.1)
LYMPH%: 5.2 % — ABNORMAL LOW (ref 14.0–49.0)
MCH: 27.8 pg (ref 27.2–33.4)
MCHC: 35.6 g/dL (ref 32.0–36.0)
MCV: 78 fL — AB (ref 79.3–98.0)
MONO#: 1.2 10*3/uL — ABNORMAL HIGH (ref 0.1–0.9)
MONO%: 6 % (ref 0.0–14.0)
NEUT#: 17.8 10*3/uL — ABNORMAL HIGH (ref 1.5–6.5)
NEUT%: 88.6 % — ABNORMAL HIGH (ref 39.0–75.0)
Platelets: 212 10*3/uL (ref 140–400)
RBC: 4.9 10*6/uL (ref 4.20–5.82)
RDW: 18.6 % — AB (ref 11.0–14.6)
WBC: 20.1 10*3/uL — AB (ref 4.0–10.3)
lymph#: 1.1 10*3/uL (ref 0.9–3.3)

## 2015-06-29 LAB — COMPREHENSIVE METABOLIC PANEL (CC13)
ALBUMIN: 3.2 g/dL — AB (ref 3.5–5.0)
ALK PHOS: 107 U/L (ref 40–150)
ALT: 10 U/L (ref 0–55)
AST: 17 U/L (ref 5–34)
Anion Gap: 11 mEq/L (ref 3–11)
BUN: 9.8 mg/dL (ref 7.0–26.0)
CHLORIDE: 103 meq/L (ref 98–109)
CO2: 24 meq/L (ref 22–29)
Calcium: 10.2 mg/dL (ref 8.4–10.4)
Creatinine: 0.6 mg/dL — ABNORMAL LOW (ref 0.7–1.3)
GLUCOSE: 139 mg/dL (ref 70–140)
POTASSIUM: 3.8 meq/L (ref 3.5–5.1)
SODIUM: 138 meq/L (ref 136–145)
Total Bilirubin: <0.3 mg/dL (ref 0.20–1.20)
Total Protein: 6.6 g/dL (ref 6.4–8.3)

## 2015-07-04 ENCOUNTER — Telehealth: Payer: Self-pay

## 2015-07-04 DIAGNOSIS — C3491 Malignant neoplasm of unspecified part of right bronchus or lung: Secondary | ICD-10-CM

## 2015-07-04 DIAGNOSIS — C7931 Secondary malignant neoplasm of brain: Secondary | ICD-10-CM

## 2015-07-04 MED ORDER — OXYCODONE HCL 5 MG PO TABS: 5.0000 mg | ORAL_TABLET | ORAL | Status: DC | PRN

## 2015-07-04 NOTE — Telephone Encounter (Signed)
[name] called requesting refill on pt's oxycodone. Rx prepared for signature.

## 2015-07-06 ENCOUNTER — Other Ambulatory Visit (HOSPITAL_BASED_OUTPATIENT_CLINIC_OR_DEPARTMENT_OTHER): Payer: Medicare Other

## 2015-07-06 DIAGNOSIS — C3491 Malignant neoplasm of unspecified part of right bronchus or lung: Secondary | ICD-10-CM | POA: Diagnosis present

## 2015-07-06 LAB — COMPREHENSIVE METABOLIC PANEL (CC13)
ALT: 12 U/L (ref 0–55)
AST: 19 U/L (ref 5–34)
Albumin: 3.3 g/dL — ABNORMAL LOW (ref 3.5–5.0)
Alkaline Phosphatase: 88 U/L (ref 40–150)
Anion Gap: 10 mEq/L (ref 3–11)
BILIRUBIN TOTAL: 0.38 mg/dL (ref 0.20–1.20)
BUN: 14.6 mg/dL (ref 7.0–26.0)
CHLORIDE: 107 meq/L (ref 98–109)
CO2: 23 meq/L (ref 22–29)
CREATININE: 0.7 mg/dL (ref 0.7–1.3)
Calcium: 9.9 mg/dL (ref 8.4–10.4)
EGFR: >90 mL/min/{1.73_m2} (ref >90)
GLUCOSE: 97 mg/dL (ref 70–140)
POTASSIUM: 3.8 meq/L (ref 3.5–5.1)
SODIUM: 140 meq/L (ref 136–145)
Total Protein: 6.6 g/dL (ref 6.4–8.3)

## 2015-07-06 LAB — CBC WITH DIFFERENTIAL/PLATELET
BASO%: 0.1 % (ref 0.0–2.0)
Basophils Absolute: 0 10*3/uL (ref 0.0–0.1)
EOS ABS: 0 10*3/uL (ref 0.0–0.5)
EOS%: 0 % (ref 0.0–7.0)
HEMATOCRIT: 40.3 % (ref 38.4–49.9)
HGB: 14.2 g/dL (ref 13.0–17.1)
LYMPH#: 1.5 10*3/uL (ref 0.9–3.3)
LYMPH%: 5.5 % — AB (ref 14.0–49.0)
MCH: 27.6 pg (ref 27.2–33.4)
MCHC: 35.2 g/dL (ref 32.0–36.0)
MCV: 78.3 fL — AB (ref 79.3–98.0)
MONO#: 0.8 10*3/uL (ref 0.1–0.9)
MONO%: 2.9 % (ref 0.0–14.0)
NEUT%: 91.5 % — AB (ref 39.0–75.0)
NEUTROS ABS: 25.8 10*3/uL — AB (ref 1.5–6.5)
PLATELETS: 199 10*3/uL (ref 140–400)
RBC: 5.15 10*6/uL (ref 4.20–5.82)
RDW: 19.8 % — ABNORMAL HIGH (ref 11.0–14.6)
WBC: 28.2 10*3/uL — ABNORMAL HIGH (ref 4.0–10.3)

## 2015-07-11 ENCOUNTER — Other Ambulatory Visit: Payer: Self-pay | Admitting: *Deleted

## 2015-07-11 DIAGNOSIS — C3491 Malignant neoplasm of unspecified part of right bronchus or lung: Secondary | ICD-10-CM

## 2015-07-12 ENCOUNTER — Ambulatory Visit (HOSPITAL_BASED_OUTPATIENT_CLINIC_OR_DEPARTMENT_OTHER): Payer: Medicare Other | Admitting: Physician Assistant

## 2015-07-12 ENCOUNTER — Other Ambulatory Visit (HOSPITAL_BASED_OUTPATIENT_CLINIC_OR_DEPARTMENT_OTHER): Payer: Medicare Other

## 2015-07-12 ENCOUNTER — Ambulatory Visit (HOSPITAL_BASED_OUTPATIENT_CLINIC_OR_DEPARTMENT_OTHER): Payer: Medicare Other

## 2015-07-12 VITALS — BP 116/71 | HR 93 | Temp 97.9°F | Resp 18 | Ht 72.0 in | Wt 132.8 lb

## 2015-07-12 DIAGNOSIS — Z5111 Encounter for antineoplastic chemotherapy: Secondary | ICD-10-CM

## 2015-07-12 DIAGNOSIS — C7951 Secondary malignant neoplasm of bone: Secondary | ICD-10-CM

## 2015-07-12 DIAGNOSIS — C3491 Malignant neoplasm of unspecified part of right bronchus or lung: Secondary | ICD-10-CM | POA: Diagnosis present

## 2015-07-12 DIAGNOSIS — Z5112 Encounter for antineoplastic immunotherapy: Secondary | ICD-10-CM

## 2015-07-12 DIAGNOSIS — Z79899 Other long term (current) drug therapy: Secondary | ICD-10-CM | POA: Diagnosis not present

## 2015-07-12 DIAGNOSIS — C3411 Malignant neoplasm of upper lobe, right bronchus or lung: Secondary | ICD-10-CM

## 2015-07-12 LAB — CBC WITH DIFFERENTIAL/PLATELET
BASO%: 0.6 % (ref 0.0–2.0)
BASOS ABS: 0.1 10*3/uL (ref 0.0–0.1)
EOS ABS: 0 10*3/uL (ref 0.0–0.5)
EOS%: 0 % (ref 0.0–7.0)
HCT: 40.7 % (ref 38.4–49.9)
HGB: 13.9 g/dL (ref 13.0–17.1)
LYMPH%: 4 % — AB (ref 14.0–49.0)
MCH: 27.8 pg (ref 27.2–33.4)
MCHC: 34.2 g/dL (ref 32.0–36.0)
MCV: 81.2 fL (ref 79.3–98.0)
MONO#: 0.2 10*3/uL (ref 0.1–0.9)
MONO%: 1.4 % (ref 0.0–14.0)
NEUT#: 13.8 10*3/uL — ABNORMAL HIGH (ref 1.5–6.5)
NEUT%: 94 % — AB (ref 39.0–75.0)
Platelets: 246 10*3/uL (ref 140–400)
RBC: 5.01 10*6/uL (ref 4.20–5.82)
RDW: 20.1 % — AB (ref 11.0–14.6)
WBC: 14.7 10*3/uL — ABNORMAL HIGH (ref 4.0–10.3)
lymph#: 0.6 10*3/uL — ABNORMAL LOW (ref 0.9–3.3)

## 2015-07-12 LAB — COMPREHENSIVE METABOLIC PANEL (CC13)
ALK PHOS: 80 U/L (ref 40–150)
ALT: 12 U/L (ref 0–55)
ANION GAP: 10 meq/L (ref 3–11)
AST: 18 U/L (ref 5–34)
Albumin: 3.3 g/dL — ABNORMAL LOW (ref 3.5–5.0)
BILIRUBIN TOTAL: 0.33 mg/dL (ref 0.20–1.20)
BUN: 9.9 mg/dL (ref 7.0–26.0)
CO2: 25 meq/L (ref 22–29)
Calcium: 10.2 mg/dL (ref 8.4–10.4)
Chloride: 104 mEq/L (ref 98–109)
Creatinine: 0.6 mg/dL — ABNORMAL LOW (ref 0.7–1.3)
GLUCOSE: 142 mg/dL — AB (ref 70–140)
POTASSIUM: 4.3 meq/L (ref 3.5–5.1)
SODIUM: 138 meq/L (ref 136–145)
Total Protein: 6.8 g/dL (ref 6.4–8.3)

## 2015-07-12 LAB — UA PROTEIN, DIPSTICK - CHCC

## 2015-07-12 MED ORDER — DOCETAXEL CHEMO INJECTION 160 MG/16ML
60.0000 mg/m2 | Freq: Once | INTRAVENOUS | Status: AC
  Administered 2015-07-12: 100 mg via INTRAVENOUS
  Filled 2015-07-12: qty 10

## 2015-07-12 MED ORDER — SODIUM CHLORIDE 0.9 % IV SOLN
Freq: Once | INTRAVENOUS | Status: AC
Start: 2015-07-12 — End: 2015-07-12
  Administered 2015-07-12: 11:00:00 via INTRAVENOUS
  Filled 2015-07-12: qty 4

## 2015-07-12 MED ORDER — SODIUM CHLORIDE 0.9 % IJ SOLN
10.0000 mL | INTRAMUSCULAR | Status: DC | PRN
  Administered 2015-07-12: 10 mL
  Filled 2015-07-12: qty 10

## 2015-07-12 MED ORDER — SODIUM CHLORIDE 0.9 % IV SOLN
10.0000 mg/kg | Freq: Once | INTRAVENOUS | Status: AC
  Administered 2015-07-12: 600 mg via INTRAVENOUS
  Filled 2015-07-12: qty 10

## 2015-07-12 MED ORDER — DIPHENHYDRAMINE HCL 50 MG/ML IJ SOLN
INTRAMUSCULAR | Status: AC
  Filled 2015-07-12: qty 1

## 2015-07-12 MED ORDER — ACETAMINOPHEN 325 MG PO TABS
ORAL_TABLET | ORAL | Status: AC
  Filled 2015-07-12: qty 2

## 2015-07-12 MED ORDER — ACETAMINOPHEN 325 MG PO TABS
650.0000 mg | ORAL_TABLET | Freq: Once | ORAL | Status: AC
  Administered 2015-07-12: 650 mg via ORAL

## 2015-07-12 MED ORDER — HEPARIN SOD (PORK) LOCK FLUSH 100 UNIT/ML IV SOLN
500.0000 [IU] | Freq: Once | INTRAVENOUS | Status: AC | PRN
  Administered 2015-07-12: 500 [IU]
  Filled 2015-07-12: qty 5

## 2015-07-12 MED ORDER — DIPHENHYDRAMINE HCL 50 MG/ML IJ SOLN
50.0000 mg | Freq: Once | INTRAMUSCULAR | Status: AC
Start: 2015-07-12 — End: 2015-07-12
  Administered 2015-07-12: 50 mg via INTRAVENOUS

## 2015-07-12 MED ORDER — SODIUM CHLORIDE 0.9 % IV SOLN
Freq: Once | INTRAVENOUS | Status: AC
  Administered 2015-07-12: 11:00:00 via INTRAVENOUS

## 2015-07-12 NOTE — Patient Instructions (Signed)
[facility] Discharge Instructions for Patients Receiving Chemotherapy  Today you received the following chemotherapy agents taxotere and cyramza   If you develop nausea and vomiting that is not controlled by your nausea medication, call the clinic.   BELOW ARE SYMPTOMS THAT SHOULD BE REPORTED IMMEDIATELY:  *FEVER GREATER THAN 100.5 F  *CHILLS WITH OR WITHOUT FEVER  NAUSEA AND VOMITING THAT IS NOT CONTROLLED WITH YOUR NAUSEA MEDICATION  *UNUSUAL SHORTNESS OF BREATH  *UNUSUAL BRUISING OR BLEEDING  TENDERNESS IN MOUTH AND THROAT WITH OR WITHOUT PRESENCE OF ULCERS  *URINARY PROBLEMS  *BOWEL PROBLEMS  UNUSUAL RASH Items with * indicate a potential emergency and should be followed up as soon as possible.  Feel free to call the clinic you have any questions or concerns. The clinic phone number is [phone].  Please show the [place] [facility] at check-in to the Emergency Department and triage nurse.

## 2015-07-12 NOTE — Progress Notes (Signed)
[place] [phone]   [phone]  OFFICE PROGRESS NOTE  Brendan Marek, MD No address on file  DIAGNOSIS: stage IV (T3, N2, M1 B) non-small cell lung cancer, adenocarcinoma presented with large right lung mass in addition to mediastinal lymphadenopathy and metastatic lesion in the right thigh diagnosed in December 2015.  PRIOR THERAPY:  1) Systemic chemotherapy with carboplatin AUC of 5 and Alimta 500 mg/m started on 09/05/2014. Status post 6 cycles discontinued secondary to disease progression. 2) Immunotherapy with Nivolumab 3 MG/KG every 2 weeks, status post 3 cycle.  CURRENT THERAPY: Systemic chemotherapy with docetaxel 60 MG/M2 and Cyramza 10 MG/KG every 3 weeks with Neulasta support. First dose 06/22/2015.  INTERVAL HISTORY: Brendan Hooper 69 y.o. male returns to the clinic today for follow-up visit accompanied by family member. His skin rash has significantly improved and left few dark scars on the skin, after Nivolumab which was discontinued. The patient is feeling much better today. He denied having any significant weight loss or night sweats. He denied having any chest pain, shortness breath, cough or hemoptysis. He has no nausea or vomiting. He denies any bleeding isses. His appetite is normal. He denies any bleeding issues. Has some pain on the left shoulder controlled with meds.   MEDICAL HISTORY: Past Medical History  Diagnosis Date  . GERD (gastroesophageal reflux disease)   . Headache(784.0)   . Sickle cell anemia (HCC)   . Lung mass   . Hypertension   . Bronchogenic cancer of right lung ([place]) 08/25/2014  . 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  MEDICATIONS:  Current Outpatient Prescriptions  Medication Sig Dispense Refill  . acetaminophen (TYLENOL) 325 MG tablet Take 2 tablets (650 mg total) by mouth every 6 (six) hours as needed for mild pain (or Fever >/=  101). 30 tablet 0  . alum & mag hydroxide-simeth (MAALOX/MYLANTA) 200-200-20 MG/5ML suspension Take 30 mLs by mouth every 6 (six) hours as needed for indigestion or heartburn (dyspepsia). 355 mL 0  . cholecalciferol (VITAMIN D) 1000 UNITS tablet Take 1,000 Units by mouth once a week.     Marland Kitchen dexamethasone (DECADRON) 4 MG tablet Take 2 tablets (8 mg total) by mouth 2 (two) times daily. Take 2 tablet the day before teh day of and the day after chemo. 40 tablet 0  . diphenhydrAMINE (BENADRYL) 25 MG tablet Take 25 mg by mouth every 6 (six) hours as needed.    . feeding supplement, ENSURE ENLIVE, (ENSURE ENLIVE) LIQD Take 237 mLs by mouth 3 (three) times daily between meals. (Patient taking differently: Take 237 mLs by mouth 2 (two) times a week. ) 237 mL 12  . fluocinonide cream (LIDEX) 0.05 %     . folic acid (FOLVITE) 1 MG tablet Take 1 tablet (1 mg total) by mouth daily. 30 tablet 0  . lidocaine-prilocaine (EMLA) cream Apply 1 application topically as needed. (Patient taking differently: Apply 1 application topically as needed (port access). ) 30 g 1  . Multiple Vitamin (MULTIVITAMIN WITH MINERALS) TABS tablet Take 1 tablet by mouth daily. (Patient taking differently: Take 1 tablet by mouth once a week. ) 30 tablet 0  . ondansetron (ZOFRAN) 4 MG tablet Take 1 tablet (4 mg total) by mouth every 6 (six) hours as needed for nausea. 20 tablet 0  . oxyCODONE (OXY IR/ROXICODONE) 5 MG immediate release tablet  Take 1-2 tablets (5-10 mg total) by mouth every 4 (four) hours as needed for moderate pain. 60 tablet 0  . potassium chloride SA (K-DUR,KLOR-CON) 20 MEQ tablet Take 1 tablet (20 mEq total) by mouth 2 (two) times daily. 14 tablet 0  . predniSONE (DELTASONE) 20 MG tablet     . prochlorperazine (COMPAZINE) 10 MG tablet Take 1 tablet (10 mg total) by mouth every 6 (six) hours as needed for nausea or vomiting. 60 tablet 1  . rivaroxaban (XARELTO) 20 MG TABS tablet Take 1 tablet (20 mg total) by mouth daily with  supper. 30 tablet 2  . thiamine 100 MG tablet Take 1 tablet (100 mg total) by mouth daily. (Patient taking differently: Take 100 mg by mouth once a week. ) 30 tablet 0   No current facility-administered medications for this visit.   Facility-Administered Medications Ordered in Other Visits  Medication Dose Route Frequency Provider Last Rate Last Dose  . sodium chloride 0.9 % injection 10 mL  10 mL Intravenous PRN Curt Bears, MD   10 mL at 02/16/15 1756    SURGICAL HISTORY:  Past Surgical History  Procedure Laterality Date  . No past surgeries    . Portacath placement Left 09/01/2014    Procedure: INSERTION PORT-A-CATH;  Surgeon: Melrose Nakayama, MD;  Location: [facility];  Service: Thoracic;  Laterality: Left;    REVIEW OF SYSTEMS:  Constitutional: negative Eyes: negative Ears, nose, mouth, throat, and face: negative Respiratory: negative Cardiovascular: negative Gastrointestinal: negative Genitourinary:negative Integument/breast: positive for Resolving skin rash Hematologic/lymphatic: negative Musculoskeletal:positive for bone pain Neurological: negative Behavioral/Psych: negative Endocrine: negative Allergic/Immunologic: negative   PHYSICAL EXAMINATION: General appearance: alert, cooperative, fatigued and no distress Head: Normocephalic, without obvious abnormality, atraumatic Neck: no adenopathy, no JVD, supple, symmetrical, trachea midline and thyroid not enlarged, symmetric, no tenderness/mass/nodules Lymph nodes: Cervical, supraclavicular, and axillary nodes normal. Resp: clear to auscultation bilaterally Back: symmetric, no curvature. ROM normal. No CVA tenderness. Cardio: regular rate and rhythm, S1, S2 normal, no murmur, click, rub or gallop GI: soft, non-tender; bowel sounds normal; no masses,  no organomegaly Extremities: extremities normal, atraumatic, no cyanosis or edema.His skin rash is almost resolved Neurologic: Alert and oriented X 3, normal strength  and tone. Normal symmetric reflexes. Normal coordination and gait  Skin exam: Dark dried lesion consistent with resolving skin rash.  ECOG PERFORMANCE STATUS: 2 - Symptomatic, <50% confined to bed  Blood pressure 116/71, pulse 93, temperature 97.9 F (36.6 C), temperature source Oral, resp. rate 18, height 6' (1.829 m), weight 132 lb 12.8 oz (60.238 kg), SpO2 99 %.  LABORATORY DATA: CBC Latest Ref Rng 07/12/2015 07/06/2015 06/29/2015  WBC 4.0 - 10.3 10e3/uL 14.7(H) 28.2(H) 20.1(H)  Hemoglobin 13.0 - 17.1 g/dL 13.9 14.2 13.6  Hematocrit 38.4 - 49.9 % 40.7 40.3 38.2(L)  Platelets 140 - 400 10e3/uL 246 199 212   CMP Latest Ref Rng 07/06/2015 06/29/2015 06/22/2015  Glucose 70 - 140 mg/dl 97 139 109  BUN 7.0 - 26.0 mg/dL 14.6 9.8 8.4  Creatinine 0.7 - 1.3 mg/dL 0.7 0.6(L) 0.6(L)  Sodium 136 - 145 mEq/L 140 138 141  Potassium 3.5 - 5.1 mEq/L 3.8 3.8 2.7(LL)  Chloride 101 - 111 mmol/L - - -  CO2 22 - 29 mEq/L 23 24 31(H)  Calcium 8.4 - 10.4 mg/dL 9.9 10.2 10.1  Total Protein 6.4 - 8.3 g/dL 6.6 6.6 6.4  Total Bilirubin 0.20 - 1.20 mg/dL 0.38 <0.30 0.49  Alkaline Phos 40 - 150 U/L 88 107  71  AST 5 - 34 U/L '19 17 17  '$ ALT 0 - 55 U/L 12 10 <9     RADIOGRAPHIC STUDIES: No results found.  ASSESSMENT AND PLAN: This is a very pleasant 69 years old [place] American male with history of stage IV non-small cell lung cancer, adenocarcinoma and currently undergoing systemic chemotherapy with carboplatin and Alimta status post 6 cycles. This was discontinued secondary to disease progression. The patient underwent treatment with immunotherapy with Nivolumab status post 3 cycles and tolerating his treatment well except for the severe skin rash and the biopsy confirms bullous pemphigoid. The patient skin lesions are resolving. The recent CT scan of the chest, abdomen and pelvis showed progression of osseous metastasis but there was improvement in the hepatic and right adrenal metastasis as well as the  right inguinal nodal metastasis. Unfortunately the patient was unable to tolerated the previous treatment with immunotherapy with Nivolumab. Several other options for treatment of his condition were explaned including palliative care and hospice referral versus consideration of second line systemic chemotherapy with reduced dose docetaxel and Cyramza. The patient was interested in proceeding with systemic chemotherapy. He began treatment with docetaxel 60 MG/M2 in addition to Cyramza 10 mg/KG every 3 weeks with Neulasta support.  He began his first cycle on 11/3 with Decadron 8 mg by mouth twice a day the day before, day of and day after the chemotherapy every 3 weeks. He is to receive cycle 2 of chemotherapy today.  He would come back for follow-up visit in 3 weeks for reevaluation before the start of cycle #3 of his treatment. He was advised to call immediately if he has any concerning symptoms in the interval.  The patient voices understanding of current disease status and treatment options and is in agreement with the current care plan.  All questions were answered. The patient knows to call the clinic with any problems, questions or concerns. We can certainly see the patient much sooner if necessary.   ADDENDUM: Hematology/Oncology Attending: I had a face to face encounter with the patient today. I recommended his care plan. This is a very pleasant 69 years old African-American male with stage IV non-small cell lung cancer, adenocarcinoma currently undergoing systemic chemotherapy with docetaxel and Cyramza status post 1 cycle. The patient tolerated the first cycle of his treatment fairly well with no significant adverse effects. He denied having any significant nausea or vomiting. He has no fever or chills. He denied having any bleeding issues. I recommended for the patient to proceed with cycle #2 today as scheduled. He would come back for follow-up visit in 3 weeks for reevaluation before  starting cycle #3. The patient was advised to call immediately if he has any concerning symptoms in the interval.  Disclaimer: This note was dictated with voice recognition software. Similar sounding words can inadvertently be transcribed and may be missed upon review. Eilleen Kempf., MD 07/12/2015

## 2015-07-14 ENCOUNTER — Ambulatory Visit (HOSPITAL_BASED_OUTPATIENT_CLINIC_OR_DEPARTMENT_OTHER): Payer: Medicare Other

## 2015-07-14 VITALS — BP 92/58 | HR 101 | Temp 98.4°F

## 2015-07-14 DIAGNOSIS — C3411 Malignant neoplasm of upper lobe, right bronchus or lung: Secondary | ICD-10-CM | POA: Diagnosis present

## 2015-07-14 DIAGNOSIS — C7951 Secondary malignant neoplasm of bone: Secondary | ICD-10-CM

## 2015-07-14 DIAGNOSIS — C3491 Malignant neoplasm of unspecified part of right bronchus or lung: Secondary | ICD-10-CM

## 2015-07-14 MED ORDER — PEGFILGRASTIM INJECTION 6 MG/0.6ML ~~LOC~~
6.0000 mg | PREFILLED_SYRINGE | Freq: Once | SUBCUTANEOUS | Status: AC
Start: 2015-07-14 — End: 2015-07-14
  Administered 2015-07-14: 6 mg via SUBCUTANEOUS
  Filled 2015-07-14: qty 0.6

## 2015-07-18 ENCOUNTER — Telehealth: Payer: Self-pay

## 2015-07-18 ENCOUNTER — Other Ambulatory Visit: Payer: Self-pay | Admitting: Medical Oncology

## 2015-07-18 DIAGNOSIS — C3491 Malignant neoplasm of unspecified part of right bronchus or lung: Secondary | ICD-10-CM

## 2015-07-18 DIAGNOSIS — C7931 Secondary malignant neoplasm of brain: Secondary | ICD-10-CM

## 2015-07-18 MED ORDER — OXYCODONE HCL 5 MG PO TABS: 5.0000 mg | ORAL_TABLET | ORAL | Status: DC | PRN

## 2015-07-18 NOTE — Progress Notes (Signed)
rx pain med refilled and Vaughan Basta notified that it is ready for pickup.

## 2015-07-18 NOTE — Telephone Encounter (Signed)
Sister called requesting oxycodone refill.

## 2015-07-20 ENCOUNTER — Other Ambulatory Visit (HOSPITAL_BASED_OUTPATIENT_CLINIC_OR_DEPARTMENT_OTHER): Payer: Medicare Other

## 2015-07-20 DIAGNOSIS — C3411 Malignant neoplasm of upper lobe, right bronchus or lung: Secondary | ICD-10-CM | POA: Diagnosis not present

## 2015-07-20 DIAGNOSIS — C3491 Malignant neoplasm of unspecified part of right bronchus or lung: Secondary | ICD-10-CM

## 2015-07-20 LAB — COMPREHENSIVE METABOLIC PANEL (CC13)
ALT: 10 U/L (ref 0–55)
ANION GAP: 10 meq/L (ref 3–11)
AST: 20 U/L (ref 5–34)
Albumin: 3.2 g/dL — ABNORMAL LOW (ref 3.5–5.0)
Alkaline Phosphatase: 117 U/L (ref 40–150)
BILIRUBIN TOTAL: 0.3 mg/dL (ref 0.20–1.20)
BUN: 11.6 mg/dL (ref 7.0–26.0)
CO2: 25 mEq/L (ref 22–29)
CREATININE: 0.7 mg/dL (ref 0.7–1.3)
Calcium: 9.9 mg/dL (ref 8.4–10.4)
Chloride: 105 mEq/L (ref 98–109)
Glucose: 119 mg/dl (ref 70–140)
Potassium: 3.3 mEq/L — ABNORMAL LOW (ref 3.5–5.1)
SODIUM: 140 meq/L (ref 136–145)
Total Protein: 6.4 g/dL (ref 6.4–8.3)

## 2015-07-20 LAB — CBC WITH DIFFERENTIAL/PLATELET
BASO%: 0.2 % (ref 0.0–2.0)
Basophils Absolute: 0 10*3/uL (ref 0.0–0.1)
EOS ABS: 0 10*3/uL (ref 0.0–0.5)
EOS%: 0 % (ref 0.0–7.0)
HCT: 41 % (ref 38.4–49.9)
HGB: 14 g/dL (ref 13.0–17.1)
LYMPH%: 3.1 % — AB (ref 14.0–49.0)
MCH: 27.5 pg (ref 27.2–33.4)
MCHC: 34.1 g/dL (ref 32.0–36.0)
MCV: 80.6 fL (ref 79.3–98.0)
MONO#: 0.3 10*3/uL (ref 0.1–0.9)
MONO%: 1.5 % (ref 0.0–14.0)
NEUT%: 95.2 % — AB (ref 39.0–75.0)
NEUTROS ABS: 22.2 10*3/uL — AB (ref 1.5–6.5)
PLATELETS: 219 10*3/uL (ref 140–400)
RBC: 5.09 10*6/uL (ref 4.20–5.82)
RDW: 19.5 % — ABNORMAL HIGH (ref 11.0–14.6)
WBC: 23.3 10*3/uL — AB (ref 4.0–10.3)
lymph#: 0.7 10*3/uL — ABNORMAL LOW (ref 0.9–3.3)

## 2015-07-27 ENCOUNTER — Other Ambulatory Visit: Payer: Self-pay | Admitting: Medical Oncology

## 2015-07-27 ENCOUNTER — Other Ambulatory Visit (HOSPITAL_BASED_OUTPATIENT_CLINIC_OR_DEPARTMENT_OTHER): Payer: Medicare Other

## 2015-07-27 DIAGNOSIS — C3491 Malignant neoplasm of unspecified part of right bronchus or lung: Secondary | ICD-10-CM

## 2015-07-27 LAB — CBC WITH DIFFERENTIAL/PLATELET
BASO%: 0.7 % (ref 0.0–2.0)
Basophils Absolute: 0.1 10*3/uL (ref 0.0–0.1)
EOS ABS: 0 10*3/uL (ref 0.0–0.5)
EOS%: 0.1 % (ref 0.0–7.0)
HCT: 43.6 % (ref 38.4–49.9)
HGB: 14.7 g/dL (ref 13.0–17.1)
LYMPH%: 10 % — AB (ref 14.0–49.0)
MCH: 27.9 pg (ref 27.2–33.4)
MCHC: 33.6 g/dL (ref 32.0–36.0)
MCV: 82.9 fL (ref 79.3–98.0)
MONO#: 0.8 10*3/uL (ref 0.1–0.9)
MONO%: 5.5 % (ref 0.0–14.0)
NEUT%: 83.7 % — AB (ref 39.0–75.0)
NEUTROS ABS: 12.2 10*3/uL — AB (ref 1.5–6.5)
PLATELETS: 252 10*3/uL (ref 140–400)
RBC: 5.26 10*6/uL (ref 4.20–5.82)
RDW: 19.1 % — ABNORMAL HIGH (ref 11.0–14.6)
WBC: 14.5 10*3/uL — AB (ref 4.0–10.3)
lymph#: 1.5 10*3/uL (ref 0.9–3.3)

## 2015-07-27 LAB — COMPREHENSIVE METABOLIC PANEL
ALT: 11 U/L (ref 0–55)
AST: 25 U/L (ref 5–34)
Albumin: 3.3 g/dL — ABNORMAL LOW (ref 3.5–5.0)
Alkaline Phosphatase: 106 U/L (ref 40–150)
Anion Gap: 19 mEq/L — ABNORMAL HIGH (ref 3–11)
BUN: 8.1 mg/dL (ref 7.0–26.0)
CHLORIDE: 102 meq/L (ref 98–109)
CO2: 18 meq/L — AB (ref 22–29)
CREATININE: 0.7 mg/dL (ref 0.7–1.3)
Calcium: 10.1 mg/dL (ref 8.4–10.4)
EGFR: >90 mL/min/{1.73_m2} (ref >90)
Glucose: 97 mg/dl (ref 70–140)
Potassium: 3.3 mEq/L — ABNORMAL LOW (ref 3.5–5.1)
SODIUM: 140 meq/L (ref 136–145)
Total Bilirubin: 0.65 mg/dL (ref 0.20–1.20)
Total Protein: 6.9 g/dL (ref 6.4–8.3)

## 2015-08-02 ENCOUNTER — Other Ambulatory Visit: Payer: Self-pay | Admitting: Medical Oncology

## 2015-08-02 DIAGNOSIS — C3491 Malignant neoplasm of unspecified part of right bronchus or lung: Secondary | ICD-10-CM

## 2015-08-03 ENCOUNTER — Encounter: Payer: Self-pay | Admitting: Internal Medicine

## 2015-08-03 ENCOUNTER — Ambulatory Visit (HOSPITAL_BASED_OUTPATIENT_CLINIC_OR_DEPARTMENT_OTHER): Payer: Medicare Other | Admitting: Internal Medicine

## 2015-08-03 ENCOUNTER — Telehealth: Payer: Self-pay | Admitting: Internal Medicine

## 2015-08-03 ENCOUNTER — Telehealth: Payer: Self-pay | Admitting: *Deleted

## 2015-08-03 ENCOUNTER — Ambulatory Visit (HOSPITAL_BASED_OUTPATIENT_CLINIC_OR_DEPARTMENT_OTHER): Payer: Medicare Other

## 2015-08-03 ENCOUNTER — Other Ambulatory Visit (HOSPITAL_BASED_OUTPATIENT_CLINIC_OR_DEPARTMENT_OTHER): Payer: Medicare Other

## 2015-08-03 VITALS — BP 88/58 | HR 107 | Resp 16

## 2015-08-03 VITALS — BP 79/55 | HR 126 | Temp 97.9°F | Resp 16 | Ht 72.0 in

## 2015-08-03 DIAGNOSIS — C3491 Malignant neoplasm of unspecified part of right bronchus or lung: Secondary | ICD-10-CM | POA: Diagnosis present

## 2015-08-03 DIAGNOSIS — C7971 Secondary malignant neoplasm of right adrenal gland: Secondary | ICD-10-CM | POA: Diagnosis not present

## 2015-08-03 DIAGNOSIS — E860 Dehydration: Secondary | ICD-10-CM

## 2015-08-03 DIAGNOSIS — E43 Unspecified severe protein-calorie malnutrition: Secondary | ICD-10-CM | POA: Diagnosis not present

## 2015-08-03 DIAGNOSIS — C774 Secondary and unspecified malignant neoplasm of inguinal and lower limb lymph nodes: Secondary | ICD-10-CM

## 2015-08-03 DIAGNOSIS — C787 Secondary malignant neoplasm of liver and intrahepatic bile duct: Secondary | ICD-10-CM

## 2015-08-03 LAB — COMPREHENSIVE METABOLIC PANEL
ALBUMIN: 3.3 g/dL — AB (ref 3.5–5.0)
ALK PHOS: 82 U/L (ref 40–150)
ALT: 15 U/L (ref 0–55)
AST: 25 U/L (ref 5–34)
Anion Gap: 14 mEq/L — ABNORMAL HIGH (ref 3–11)
BUN: 10.6 mg/dL (ref 7.0–26.0)
CO2: 24 mEq/L (ref 22–29)
Calcium: 10.6 mg/dL — ABNORMAL HIGH (ref 8.4–10.4)
Chloride: 105 mEq/L (ref 98–109)
Creatinine: 0.8 mg/dL (ref 0.7–1.3)
GLUCOSE: 120 mg/dL (ref 70–140)
POTASSIUM: 3.1 meq/L — AB (ref 3.5–5.1)
SODIUM: 143 meq/L (ref 136–145)
Total Bilirubin: 0.59 mg/dL (ref 0.20–1.20)
Total Protein: 7 g/dL (ref 6.4–8.3)

## 2015-08-03 LAB — CBC WITH DIFFERENTIAL/PLATELET
BASO%: 0.4 % (ref 0.0–2.0)
BASOS ABS: 0 10*3/uL (ref 0.0–0.1)
EOS ABS: 0.1 10*3/uL (ref 0.0–0.5)
EOS%: 0.8 % (ref 0.0–7.0)
HCT: 42.5 % (ref 38.4–49.9)
HEMOGLOBIN: 14.9 g/dL (ref 13.0–17.1)
LYMPH%: 16 % (ref 14.0–49.0)
MCH: 28.3 pg (ref 27.2–33.4)
MCHC: 35.1 g/dL (ref 32.0–36.0)
MCV: 80.8 fL (ref 79.3–98.0)
MONO#: 0.9 10*3/uL (ref 0.1–0.9)
MONO%: 12.1 % (ref 0.0–14.0)
NEUT#: 5.2 10*3/uL (ref 1.5–6.5)
NEUT%: 70.7 % (ref 39.0–75.0)
Platelets: 258 10*3/uL (ref 140–400)
RBC: 5.26 10*6/uL (ref 4.20–5.82)
RDW: 18.2 % — ABNORMAL HIGH (ref 11.0–14.6)
WBC: 7.4 10*3/uL (ref 4.0–10.3)
lymph#: 1.2 10*3/uL (ref 0.9–3.3)

## 2015-08-03 LAB — UA PROTEIN, DIPSTICK - CHCC: Protein, ur: 30 mg/dL

## 2015-08-03 MED ORDER — SODIUM CHLORIDE 0.9 % IV SOLN
Freq: Once | INTRAVENOUS | Status: DC
  Administered 2015-08-03: 13:00:00 via INTRAVENOUS
  Filled 2015-08-03: qty 4

## 2015-08-03 MED ORDER — SODIUM CHLORIDE 0.9 % IV SOLN
1000.0000 mL | Freq: Once | INTRAVENOUS | Status: DC
  Administered 2015-08-03: 1000 mL via INTRAVENOUS

## 2015-08-03 MED ORDER — HEPARIN SOD (PORK) LOCK FLUSH 100 UNIT/ML IV SOLN
500.0000 [IU] | Freq: Once | INTRAVENOUS | Status: AC
  Administered 2015-08-03: 500 [IU] via INTRAVENOUS
  Filled 2015-08-03: qty 5

## 2015-08-03 MED ORDER — SODIUM CHLORIDE 0.9 % IJ SOLN
10.0000 mL | INTRAMUSCULAR | Status: DC | PRN
  Administered 2015-08-03: 10 mL via INTRAVENOUS
  Filled 2015-08-03: qty 10

## 2015-08-03 NOTE — Patient Instructions (Signed)

## 2015-08-03 NOTE — Progress Notes (Signed)
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  PRIOR THERAPY:  1) Systemic chemotherapy with carboplatin AUC of 5 and Alimta 500 mg/m started on 09/05/2014. Status post 6 cycles discontinued secondary to disease progression. 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  CURRENT THERAPY: Systemic chemotherapy with docetaxel 60 MG/M2 and Cyramza 10 MG/KG every 3 weeks with Neulasta support. First dose 06/22/2015.  INTERVAL HISTORY: Brendan Hooper 69 y.o. male returns to the clinic today for follow-up visit accompanied by family member. The patient is currently on systemic chemotherapy with reduced dose docetaxel and Cyramza status post 2 cycles and has been treating his treatment fairly well . Starting last week the patient has been complaining of increasing fatigue and weakness with lack of appetite and dehydration. He denied having any significant weight loss or night sweats. He denied having any chest pain, shortness breath, cough or hemoptysis. He has no nausea or vomiting. He was supposed to start cycle #3 of his systemic chemotherapy today.  MEDICAL HISTORY: Past Medical History  Diagnosis Date  . GERD (gastroesophageal reflux disease)   . Headache(784.0)   . Sickle cell anemia (HCC)   . Lung mass   . Hypertension   . Bronchogenic cancer of right lung ([place]) 08/25/2014  . Brain cancer ([place])     non small cell lung ca with mets to brain    ALLERGIES:  has No Known Allergies.  MEDICATIONS:  Current Outpatient Prescriptions  Medication Sig Dispense Refill  . acetaminophen (TYLENOL) 325  MG tablet Take 2 tablets (650 mg total) by mouth every 6 (six) hours as needed for mild pain (or Fever >/= 101). 30 tablet 0  . alum & mag hydroxide-simeth (MAALOX/MYLANTA) 200-200-20 MG/5ML suspension Take 30 mLs by mouth every 6 (six) hours as needed for indigestion or heartburn (dyspepsia). 355 mL 0  . cholecalciferol (VITAMIN D) 1000 UNITS tablet Take 1,000 Units by mouth once a week.     Marland Kitchen dexamethasone (DECADRON) 4 MG tablet Take 2 tablets (8 mg total) by mouth 2 (two) times daily. Take 2 tablet the day before teh day of and the day after chemo. 40 tablet 0  . diphenhydrAMINE (BENADRYL) 25 MG tablet Take 25 mg by mouth every 6 (six) hours as needed.    . feeding supplement, ENSURE ENLIVE, (ENSURE ENLIVE) LIQD Take 237 mLs by mouth 3 (three) times daily between meals. (Patient taking differently: Take 237 mLs by mouth 2 (two) times a week. ) 237 mL 12  . fluocinonide cream (LIDEX) 0.05 %     . folic acid (FOLVITE) 1 MG tablet Take 1 tablet (1 mg total) by mouth daily. 30 tablet 0  . lidocaine-prilocaine (EMLA) cream Apply 1 application topically as needed. (Patient taking differently: Apply 1 application topically as needed (port access). ) 30 g 1  . Multiple Vitamin (MULTIVITAMIN WITH MINERALS) TABS tablet Take 1 tablet by mouth daily. (Patient taking differently: Take 1 tablet by mouth once a week. ) 30 tablet 0  . ondansetron (ZOFRAN) 4 MG tablet Take 1 tablet (4 mg total) by mouth every 6 (six) hours as needed for nausea. [place]  tablet 0  . oxyCODONE (OXY IR/ROXICODONE) 5 MG immediate release tablet Take 1-2 tablets (5-10 mg total) by mouth every 4 (four) hours as needed for moderate pain. 60 tablet 0  . potassium chloride SA (K-DUR,KLOR-CON) 20 MEQ tablet Take 1 tablet (20 mEq total) by mouth 2 (two) times daily. 14 tablet 0  . predniSONE (DELTASONE) 20 MG tablet     . prochlorperazine (COMPAZINE) 10 MG tablet Take 1 tablet (10 mg total) by mouth every 6 (six) hours as needed for nausea or  vomiting. 60 tablet 1  . rivaroxaban (XARELTO) 20 MG TABS tablet Take 1 tablet (20 mg total) by mouth daily with supper. 30 tablet 2  . thiamine 100 MG tablet Take 1 tablet (100 mg total) by mouth daily. (Patient taking differently: Take 100 mg by mouth once a week. ) 30 tablet 0   No current facility-administered medications for this visit.   Facility-Administered Medications Ordered in Other Visits  Medication Dose Route Frequency Provider Last Rate Last Dose  . sodium chloride 0.9 % injection 10 mL  10 mL Intravenous PRN Curt Bears, MD   10 mL at 02/16/15 1756    SURGICAL HISTORY:  Past Surgical History  Procedure Laterality Date  . No past surgeries    . Portacath placement Left 09/01/2014    Procedure: INSERTION PORT-A-CATH;  Surgeon: Melrose Nakayama, MD;  Location: [facility];  Service: Thoracic;  Laterality: Left;    REVIEW OF SYSTEMS:  A comprehensive review of systems was negative except for: Constitutional: positive for anorexia, fatigue and weight loss Musculoskeletal: positive for muscle weakness   PHYSICAL EXAMINATION: General appearance: alert, cooperative, fatigued and no distress Head: Normocephalic, without obvious abnormality, atraumatic Neck: no adenopathy, no JVD, supple, symmetrical, trachea midline and thyroid not enlarged, symmetric, no tenderness/mass/nodules Lymph nodes: Cervical, supraclavicular, and axillary nodes normal. Resp: clear to auscultation bilaterally Back: symmetric, no curvature. ROM normal. No CVA tenderness. Cardio: regular rate and rhythm, S1, S2 normal, no murmur, click, rub or gallop GI: soft, non-tender; bowel sounds normal; no masses,  no organomegaly Extremities: extremities normal, atraumatic, no cyanosis or edema Neurologic: Alert and oriented X 3, normal strength and tone. Normal symmetric reflexes. Normal coordination and gait  Skin exam: Dark dried lesion consistent with resolving skin rash.  ECOG PERFORMANCE STATUS: 2 -  Symptomatic, <50% confined to bed  There were no vitals taken for this visit.  LABORATORY DATA: Lab Results  Component Value Date   WBC 7.4 08/03/2015   HGB 14.9 08/03/2015   HCT 42.5 08/03/2015   MCV 80.8 08/03/2015   PLT 258 08/03/2015      Chemistry      Component Value Date/Time   NA 140 07/27/2015 0932   NA 137 03/27/2015 1952   K 3.3* 07/27/2015 0932   K 2.8* 03/27/2015 1952   CL 100* 03/27/2015 1952   CO2 18* 07/27/2015 0932   CO2 27 03/27/2015 1952   BUN 8.1 07/27/2015 0932   BUN 9 03/27/2015 1952   CREATININE 0.7 07/27/2015 0932   CREATININE 0.58* 03/27/2015 1952      Component Value Date/Time   CALCIUM 10.1 07/27/2015 0932   CALCIUM 10.0 03/27/2015 1952   ALKPHOS 106 07/27/2015 0932   ALKPHOS 88 03/27/2015 1952   AST 25 07/27/2015 0932   AST 24 03/27/2015 1952   ALT 11 07/27/2015 0932   ALT 11* 03/27/2015 1952   BILITOT 0.65 07/27/2015 0932   BILITOT 0.1* 03/27/2015 1952  RADIOGRAPHIC STUDIES: No results found.  ASSESSMENT AND PLAN: This is a very pleasant 69 years old [place] American male with history of stage IV non-small cell lung cancer, adenocarcinoma and currently undergoing systemic chemotherapy with carboplatin and Alimta status post 6 cycles. This was discontinued secondary to disease progression. The patient underwent treatment with immunotherapy with Nivolumab status post 3 cycles and tolerating his treatment well except for the severe skin rash and the biopsy confirms bullous pemphigoid. The patient skin lesions are resolving. The recent CT scan of the chest, abdomen and pelvis showed progression of osseous metastasis but there was improvement in the hepatic and right adrenal metastasis as well as the right inguinal nodal metastasis.  He currently undergoing treatment with docetaxel 60 MG/M2 in addition to Cyramza 10 mg/KG every 3 weeks with Neulasta support. Status post 2 cycles.  The patient is not feeling well today with increasing  fatigue and weakness. I will delay the start of cycle #3 x 1 week.  For dehydration, I will arrange for the patient to receive 1 L of normal saline today. For the history of pulmonary embolism, the patient will continue on Xarelto. For pain management the patient will continue on OxyIR. He would come back for follow-up visit in 4 weeks for reevaluation before the start of cycle #4 of his treatment after repeating CT scan of the chest, abdomen and pelvis for restaging of his disease. He was advised to call immediately if he has any concerning symptoms in the interval.  The patient voices understanding of current disease status and treatment options and is in agreement with the current care plan.  All questions were answered. The patient knows to call the clinic with any problems, questions or concerns. We can certainly see the patient much sooner if necessary.  Disclaimer: This note was dictated with voice recognition software. Similar sounding words can inadvertently be transcribed and may not be corrected upon review.

## 2015-08-03 NOTE — Telephone Encounter (Signed)
cld pt to adv of next appt on 12/22-no answer-mailed copy of avs

## 2015-08-03 NOTE — Progress Notes (Signed)
Quick Note:  Call patient with the result and order K dur 20 meq po qd x 7 days. ______

## 2015-08-03 NOTE — Telephone Encounter (Signed)
per pof to sch pt appt-sent MW email to sch trmt-gave [pt copy of avs

## 2015-08-03 NOTE — Telephone Encounter (Signed)
Per staff message and POF I have scheduled appts. Advised scheduler of appts and to move labns  JMW

## 2015-08-03 NOTE — Progress Notes (Signed)
No chemo for pt IVF only per MD

## 2015-08-04 ENCOUNTER — Encounter: Payer: Self-pay | Admitting: Medical Oncology

## 2015-08-04 ENCOUNTER — Telehealth: Payer: Self-pay | Admitting: *Deleted

## 2015-08-04 DIAGNOSIS — E876 Hypokalemia: Secondary | ICD-10-CM

## 2015-08-04 MED ORDER — POTASSIUM CHLORIDE CRYS ER 20 MEQ PO TBCR: 20.0000 meq | EXTENDED_RELEASE_TABLET | Freq: Every day | ORAL | Status: DC

## 2015-08-04 NOTE — Telephone Encounter (Signed)
rx sent to pharmacy. Pt notified

## 2015-08-04 NOTE — Telephone Encounter (Signed)
-----   Message from Curt Bears, MD sent at 08/03/2015 12:25 PM EST ----- Call patient with the result and order K dur 20 meq po qd x 7 days.

## 2015-08-05 ENCOUNTER — Ambulatory Visit: Payer: Medicare Other

## 2015-08-08 ENCOUNTER — Encounter (HOSPITAL_COMMUNITY): Payer: Self-pay

## 2015-08-08 ENCOUNTER — Encounter: Payer: Self-pay | Admitting: Internal Medicine

## 2015-08-08 ENCOUNTER — Inpatient Hospital Stay (HOSPITAL_COMMUNITY)
Admission: EM | Admit: 2015-08-08 | Discharge: 2015-08-16 | DRG: 193 | Disposition: A | Discharge status: Hospice | Payer: Medicare Other | Attending: Internal Medicine | Admitting: Internal Medicine

## 2015-08-08 ENCOUNTER — Emergency Department (HOSPITAL_COMMUNITY): Payer: Medicare Other

## 2015-08-08 DIAGNOSIS — C7931 Secondary malignant neoplasm of brain: Secondary | ICD-10-CM | POA: Diagnosis present

## 2015-08-08 DIAGNOSIS — D571 Sickle-cell disease without crisis: Secondary | ICD-10-CM | POA: Diagnosis present

## 2015-08-08 DIAGNOSIS — Z66 Do not resuscitate: Secondary | ICD-10-CM | POA: Diagnosis present

## 2015-08-08 DIAGNOSIS — L512 Toxic epidermal necrolysis [Lyell]: Secondary | ICD-10-CM | POA: Diagnosis present

## 2015-08-08 DIAGNOSIS — R531 Weakness: Secondary | ICD-10-CM

## 2015-08-08 DIAGNOSIS — E43 Unspecified severe protein-calorie malnutrition: Secondary | ICD-10-CM | POA: Diagnosis present

## 2015-08-08 DIAGNOSIS — I2699 Other pulmonary embolism without acute cor pulmonale: Secondary | ICD-10-CM | POA: Diagnosis present

## 2015-08-08 DIAGNOSIS — Z515 Encounter for palliative care: Secondary | ICD-10-CM | POA: Diagnosis present

## 2015-08-08 DIAGNOSIS — Z681 Body mass index (BMI) 19 or less, adult: Secondary | ICD-10-CM

## 2015-08-08 DIAGNOSIS — Z809 Family history of malignant neoplasm, unspecified: Secondary | ICD-10-CM

## 2015-08-08 DIAGNOSIS — K219 Gastro-esophageal reflux disease without esophagitis: Secondary | ICD-10-CM | POA: Diagnosis present

## 2015-08-08 DIAGNOSIS — F17210 Nicotine dependence, cigarettes, uncomplicated: Secondary | ICD-10-CM | POA: Diagnosis present

## 2015-08-08 DIAGNOSIS — R0600 Dyspnea, unspecified: Secondary | ICD-10-CM | POA: Insufficient documentation

## 2015-08-08 DIAGNOSIS — R627 Adult failure to thrive: Secondary | ICD-10-CM | POA: Diagnosis present

## 2015-08-08 DIAGNOSIS — E876 Hypokalemia: Secondary | ICD-10-CM | POA: Diagnosis present

## 2015-08-08 DIAGNOSIS — R0682 Tachypnea, not elsewhere classified: Secondary | ICD-10-CM

## 2015-08-08 DIAGNOSIS — T17908S Unspecified foreign body in respiratory tract, part unspecified causing other injury, sequela: Secondary | ICD-10-CM

## 2015-08-08 DIAGNOSIS — I10 Essential (primary) hypertension: Secondary | ICD-10-CM | POA: Diagnosis present

## 2015-08-08 DIAGNOSIS — C3491 Malignant neoplasm of unspecified part of right bronchus or lung: Secondary | ICD-10-CM | POA: Diagnosis present

## 2015-08-08 DIAGNOSIS — F1010 Alcohol abuse, uncomplicated: Secondary | ICD-10-CM | POA: Diagnosis present

## 2015-08-08 DIAGNOSIS — J189 Pneumonia, unspecified organism: Principal | ICD-10-CM

## 2015-08-08 DIAGNOSIS — E860 Dehydration: Secondary | ICD-10-CM | POA: Diagnosis present

## 2015-08-08 DIAGNOSIS — Z86711 Personal history of pulmonary embolism: Secondary | ICD-10-CM

## 2015-08-08 DIAGNOSIS — L120 Bullous pemphigoid: Secondary | ICD-10-CM | POA: Diagnosis present

## 2015-08-08 LAB — CBC WITH DIFFERENTIAL/PLATELET
BASOS ABS: 0 10*3/uL (ref 0.0–0.1)
Basophils Relative: 0 %
EOS ABS: 0.9 10*3/uL — AB (ref 0.0–0.7)
EOS PCT: 10 %
HCT: 40.9 % (ref 39.0–52.0)
Hemoglobin: 14.4 g/dL (ref 13.0–17.0)
Lymphocytes Relative: 11 %
Lymphs Abs: 1 10*3/uL (ref 0.7–4.0)
MCH: 28.2 pg (ref 26.0–34.0)
MCHC: 35.2 g/dL (ref 30.0–36.0)
MCV: 80.2 fL (ref 78.0–100.0)
Monocytes Absolute: 1.3 10*3/uL — ABNORMAL HIGH (ref 0.1–1.0)
Monocytes Relative: 14 %
Neutro Abs: 5.9 10*3/uL (ref 1.7–7.7)
Neutrophils Relative %: 65 %
PLATELETS: 259 10*3/uL (ref 150–400)
RBC: 5.1 MIL/uL (ref 4.22–5.81)
RDW: 17.2 % — ABNORMAL HIGH (ref 11.5–15.5)
WBC: 9.2 10*3/uL (ref 4.0–10.5)

## 2015-08-08 LAB — COMPREHENSIVE METABOLIC PANEL
ALT: 12 U/L — AB (ref 17–63)
AST: 25 U/L (ref 15–41)
Albumin: 3.4 g/dL — ABNORMAL LOW (ref 3.5–5.0)
Alkaline Phosphatase: 59 U/L (ref 38–126)
Anion gap: 13 (ref 5–15)
BUN: 11 mg/dL (ref 6–20)
CHLORIDE: 108 mmol/L (ref 101–111)
CO2: 25 mmol/L (ref 22–32)
CREATININE: 0.72 mg/dL (ref 0.61–1.24)
Calcium: 10.1 mg/dL (ref 8.9–10.3)
GFR calc non Af Amer: >60 mL/min (ref >60)
Glucose, Bld: 114 mg/dL — ABNORMAL HIGH (ref 65–99)
Potassium: 2.8 mmol/L — ABNORMAL LOW (ref 3.5–5.1)
SODIUM: 146 mmol/L — AB (ref 135–145)
Total Bilirubin: 1 mg/dL (ref 0.3–1.2)
Total Protein: 6.5 g/dL (ref 6.5–8.1)

## 2015-08-08 LAB — URINALYSIS, ROUTINE W REFLEX MICROSCOPIC
Glucose, UA: NEGATIVE mg/dL
Hgb urine dipstick: NEGATIVE
KETONES UR: 15 mg/dL — AB
Leukocytes, UA: NEGATIVE
NITRITE: NEGATIVE
PROTEIN: NEGATIVE mg/dL
SPECIFIC GRAVITY, URINE: 1.026 (ref 1.005–1.030)
pH: 6 (ref 5.0–8.0)

## 2015-08-08 LAB — I-STAT TROPONIN, ED: Troponin i, poc: 0.02 ng/mL (ref 0.00–0.08)

## 2015-08-08 LAB — I-STAT CG4 LACTIC ACID, ED: LACTIC ACID, VENOUS: 2.33 mmol/L — AB (ref 0.5–2.0)

## 2015-08-08 LAB — MRSA PCR SCREENING: MRSA BY PCR: NEGATIVE

## 2015-08-08 MED ORDER — SODIUM CHLORIDE 0.9 % IV BOLUS (SEPSIS)
1000.0000 mL | Freq: Once | INTRAVENOUS | Status: AC
  Administered 2015-08-08: 1000 mL via INTRAVENOUS

## 2015-08-08 MED ORDER — POTASSIUM CHLORIDE CRYS ER 20 MEQ PO TBCR
40.0000 meq | EXTENDED_RELEASE_TABLET | ORAL | Status: AC
  Administered 2015-08-08 (×2): 40 meq via ORAL
  Filled 2015-08-08: qty 2

## 2015-08-08 MED ORDER — PRO-STAT SUGAR FREE PO LIQD
30.0000 mL | Freq: Three times a day (TID) | ORAL | Status: DC
  Administered 2015-08-09 – 2015-08-14 (×4): 30 mL via ORAL
  Filled 2015-08-08 (×6): qty 30

## 2015-08-08 MED ORDER — NYSTATIN 100000 UNIT/GM EX CREA
TOPICAL_CREAM | Freq: Three times a day (TID) | CUTANEOUS | Status: DC
  Administered 2015-08-09 – 2015-08-12 (×12): via TOPICAL
  Administered 2015-08-13: 1 via TOPICAL
  Administered 2015-08-13 (×2): via TOPICAL
  Administered 2015-08-14: 1 via TOPICAL
  Administered 2015-08-14: 22:00:00 via TOPICAL
  Administered 2015-08-14: 1 via TOPICAL
  Administered 2015-08-15 – 2015-08-16 (×2): via TOPICAL
  Filled 2015-08-08 (×3): qty 15

## 2015-08-08 MED ORDER — ALUM & MAG HYDROXIDE-SIMETH 200-200-20 MG/5ML PO SUSP: 30.0000 mL | Freq: Four times a day (QID) | ORAL | Status: DC | PRN

## 2015-08-08 MED ORDER — DEXAMETHASONE 4 MG PO TABS: 8.0000 mg | ORAL_TABLET | Freq: Two times a day (BID) | ORAL | Status: DC

## 2015-08-08 MED ORDER — ENSURE ENLIVE PO LIQD
237.0000 mL | ORAL | Status: DC
  Administered 2015-08-10 – 2015-08-14 (×2): 237 mL via ORAL

## 2015-08-08 MED ORDER — IOHEXOL 350 MG/ML SOLN
100.0000 mL | Freq: Once | INTRAVENOUS | Status: AC | PRN
Start: 2015-08-08 — End: 2015-08-08
  Administered 2015-08-08: 90 mL via INTRAVENOUS

## 2015-08-08 MED ORDER — POTASSIUM CHLORIDE CRYS ER 20 MEQ PO TBCR
20.0000 meq | EXTENDED_RELEASE_TABLET | Freq: Every day | ORAL | Status: DC
  Administered 2015-08-09 – 2015-08-11 (×3): 20 meq via ORAL
  Filled 2015-08-08 (×3): qty 1

## 2015-08-08 MED ORDER — HYDROCERIN EX CREA
TOPICAL_CREAM | Freq: Two times a day (BID) | CUTANEOUS | Status: DC
  Administered 2015-08-09 – 2015-08-13 (×9): via TOPICAL
  Administered 2015-08-13: 1 via TOPICAL
  Administered 2015-08-14: 22:00:00 via TOPICAL
  Administered 2015-08-14: 1 via TOPICAL
  Administered 2015-08-15 – 2015-08-16 (×3): via TOPICAL
  Filled 2015-08-08 (×2): qty 113

## 2015-08-08 MED ORDER — ALBUTEROL SULFATE (2.5 MG/3ML) 0.083% IN NEBU: 2.5000 mg | INHALATION_SOLUTION | RESPIRATORY_TRACT | Status: DC | PRN

## 2015-08-08 MED ORDER — VITAMIN B-1 100 MG PO TABS
100.0000 mg | ORAL_TABLET | ORAL | Status: DC
  Administered 2015-08-11: 100 mg via ORAL
  Filled 2015-08-08: qty 1

## 2015-08-08 MED ORDER — FOLIC ACID 1 MG PO TABS
1.0000 mg | ORAL_TABLET | Freq: Every day | ORAL | Status: DC
  Administered 2015-08-09 – 2015-08-13 (×5): 1 mg via ORAL
  Filled 2015-08-08 (×5): qty 1

## 2015-08-08 MED ORDER — RIVAROXABAN 20 MG PO TABS
20.0000 mg | ORAL_TABLET | Freq: Every day | ORAL | Status: DC
  Administered 2015-08-09 – 2015-08-14 (×6): 20 mg via ORAL
  Filled 2015-08-08 (×6): qty 1

## 2015-08-08 MED ORDER — LEVOFLOXACIN IN D5W 750 MG/150ML IV SOLN: 750.0000 mg | Freq: Once | INTRAVENOUS | Status: DC

## 2015-08-08 MED ORDER — LIDOCAINE-PRILOCAINE 2.5-2.5 % EX CREA: 1.0000 "application " | TOPICAL_CREAM | CUTANEOUS | Status: DC | PRN

## 2015-08-08 MED ORDER — ONDANSETRON HCL 4 MG/2ML IJ SOLN: 4.0000 mg | Freq: Four times a day (QID) | INTRAMUSCULAR | Status: DC | PRN

## 2015-08-08 MED ORDER — VITAMIN D 1000 UNITS PO TABS
1000.0000 [IU] | ORAL_TABLET | ORAL | Status: DC
  Administered 2015-08-11: 1000 [IU] via ORAL
  Filled 2015-08-08: qty 1

## 2015-08-08 MED ORDER — POTASSIUM CHLORIDE 10 MEQ/100ML IV SOLN
10.0000 meq | INTRAVENOUS | Status: AC
  Administered 2015-08-08: 10 meq via INTRAVENOUS
  Filled 2015-08-08 (×2): qty 100

## 2015-08-08 MED ORDER — DEXTROSE 5 % IV SOLN
500.0000 mg | INTRAVENOUS | Status: DC
  Administered 2015-08-08 – 2015-08-09 (×2): 500 mg via INTRAVENOUS
  Filled 2015-08-08 (×2): qty 500

## 2015-08-08 MED ORDER — SODIUM CHLORIDE 0.9 % IV SOLN
3.0000 g | Freq: Four times a day (QID) | INTRAVENOUS | Status: DC
  Administered 2015-08-08 – 2015-08-12 (×15): 3 g via INTRAVENOUS
  Filled 2015-08-08 (×16): qty 3

## 2015-08-08 MED ORDER — POTASSIUM CHLORIDE IN NACL 40-0.9 MEQ/L-% IV SOLN
INTRAVENOUS | Status: AC
  Filled 2015-08-08: qty 1000

## 2015-08-09 DIAGNOSIS — E43 Unspecified severe protein-calorie malnutrition: Secondary | ICD-10-CM

## 2015-08-09 DIAGNOSIS — E860 Dehydration: Secondary | ICD-10-CM

## 2015-08-09 DIAGNOSIS — C3491 Malignant neoplasm of unspecified part of right bronchus or lung: Secondary | ICD-10-CM

## 2015-08-09 DIAGNOSIS — E876 Hypokalemia: Secondary | ICD-10-CM

## 2015-08-09 DIAGNOSIS — J189 Pneumonia, unspecified organism: Principal | ICD-10-CM

## 2015-08-09 LAB — BASIC METABOLIC PANEL
ANION GAP: 11 (ref 5–15)
CHLORIDE: 111 mmol/L (ref 101–111)
CO2: 27 mmol/L (ref 22–32)
Calcium: 9.4 mg/dL (ref 8.9–10.3)
Creatinine, Ser: 0.55 mg/dL — ABNORMAL LOW (ref 0.61–1.24)
GFR calc Af Amer: >60 mL/min (ref >60)
GFR calc non Af Amer: >60 mL/min (ref >60)
GLUCOSE: 114 mg/dL — AB (ref 65–99)
POTASSIUM: 2.8 mmol/L — AB (ref 3.5–5.1)
Sodium: 149 mmol/L — ABNORMAL HIGH (ref 135–145)

## 2015-08-09 LAB — HIV ANTIBODY (ROUTINE TESTING W REFLEX): HIV SCREEN 4TH GENERATION: NONREACTIVE

## 2015-08-09 MED ORDER — LIP MEDEX EX OINT
TOPICAL_OINTMENT | CUTANEOUS | Status: AC
  Administered 2015-08-09: 23:00:00
  Filled 2015-08-09: qty 7

## 2015-08-09 NOTE — Progress Notes (Signed)
PT Cancellation Note  Patient Details Name: Julia Kulzer MRN: [id number] DOB: 06/07/1946   Cancelled Treatment:    Reason Eval/Treat Not Completed: Other (comment) (PT will await Palliative Care consult for [place] and  indication for PT.)   Claretha Cooper 08/09/2015, 7:53 AM Tresa Endo PT [phone]

## 2015-08-09 NOTE — Progress Notes (Signed)
DIAGNOSIS: stage IV (T3, N2, M1 B) non-small cell lung cancer, adenocarcinoma presented with large right lung mass in addition to mediastinal lymphadenopathy and metastatic lesion in the right thigh diagnosed in December 2015.  PRIOR THERAPY:  1) Systemic chemotherapy with carboplatin AUC of 5 and Alimta 500 mg/m started on 09/05/2014. Status post 6 cycles discontinued secondary to disease progression. 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  CURRENT THERAPY: Systemic chemotherapy with docetaxel 60 MG/M2 and Cyramza 10 MG/KG every 3 weeks with Neulasta support. First dose 06/22/2015. Status post 2 cycles. He was supposed to start cycle #3 tomorrow.  Subjective: The patient is seen and examined today. He was admitted 2 days ago to [facility] with generalized weakness and fatigue in addition to his lack of appetite, poor by mouth intake and malnutrition. CT angiogram of the chest on 2015-09-07 showed no evidence for pulmonary embolism but there was evidence for disease progression compared to 6 months ago. The scan was not compared to the previous scan in October 2016. The patient is feeling much better this morning after receiving IV hydration. His comprehensive metabolic panel on admission showed persistent hypokalemia. He denied having any significant fever or chills. He has no nausea or vomiting.  Objective: Vital signs in last 24 hours: Temp:  [97.4 F (36.3 C)-98.1 F (36.7 C)] 97.4 F (36.3 C) (12/21 0434) Pulse Rate:  [112-124] 123 (12/21 0434) Resp:  [16-20] 18 (12/21 0434) BP: (115-146)/(77-106) 146/77 mmHg (12/21 0434) SpO2:  [97 %-100 %] 99 % (12/21 0434) Weight:  [132 lb 11.5 oz (60.2 kg)] 132 lb 11.5 oz (60.2 kg) (12/20 1710)  Intake/Output from previous day: 12/20 0701 - 12/21 0700 In: 1000 [IV Piggyback:1000] Out: 225 [Urine:225] Intake/Output this shift:    General  appearance: alert, cooperative, cachectic and fatigued Resp: rubs bilaterally and wheezes bilaterally Cardio: regular rate and rhythm, S1, S2 normal, no murmur, click, rub or gallop GI: soft, non-tender; bowel sounds normal; no masses,  no organomegaly Extremities: extremities normal, atraumatic, no cyanosis or edema  Lab Results:   Recent Labs  07-Sep-2015 1247  WBC 9.2  HGB 14.4  HCT 40.9  PLT 259   BMET  Recent Labs  September 07, 2015 1247  NA 146*  K 2.8*  CL 108  CO2 25  GLUCOSE 114*  BUN 11  CREATININE 0.72  CALCIUM 10.1    Studies/Results: Ct Angio Chest Pe W/cm &/or Wo Cm  09/07/2015  CLINICAL DATA:  Shortness of breath and chest pain. Known lung carcinoma. EXAM: CT ANGIOGRAPHY CHEST WITH CONTRAST TECHNIQUE: Multidetector CT imaging of the chest was performed using the standard protocol during bolus administration of intravenous contrast. Multiplanar CT image reconstructions and MIPs were obtained to evaluate the vascular anatomy. CONTRAST:  41m OMNIPAQUE IOHEXOL 350 MG/ML SOLN COMPARISON:  Chest radiograph D2017/[id number] chest CT January 27, 2015 FINDINGS: There is no demonstrable pulmonary embolus. There is atherosclerotic calcification in the aorta. There is no thoracic aortic aneurysm or dissection. The visualized great vessels appear normal except for foci of atherosclerotic calcification near the origins of these vessels. There is underlying emphysematous change. The mass arising from the posterior segment of the right lobe of the liver extends into the adjacent right mediastinum and right hilar regions. This mass currently measures 7.9 x 7.7 x 5.0 cm. This mass has irregular lobulated borders. There are several adjacent smaller masses which are partially discernible from the main  dominant mass. The largest of these adjacent masses measures 1.6 x 1.4 cm. Posterior to this dominant mass, there is airspace consolidation in the superior and posterior segments of the right lower  lobe. There is a small right pleural effusion. There are multiple scattered metastatic lesions throughout the right lung. The largest of these metastatic foci is in the posterior segment right lower lobe near the right hemidiaphragm measuring 1.3 x 1.3 cm. There are multiple subcentimeter nodular lesions in the right upper and middle lobe regions consistent with metastatic foci. There is also a 7 x 6 mm nodular lesion consistent with a metastatic focus in the anterior segment of the left upper lobe. There is underlying emphysematous change. Thyroid appears normal. There is right hilar adenopathy with the largest right hilar lymph node measuring 1.9 x 1.8 cm. There is a sub- carinal lymph node measuring 2.1 x 1.5 cm. There is a left hilar lymph node measuring 1.4 x 0.9 cm. There is a lymph node to the left of the lower trachea measuring 1.3 x 1.0 cm. Pericardium is not thickened. In the visualized upper abdomen, there are adrenal masses bilaterally consistent with metastases. The right adrenal mass measures 3.6 x 2.9 cm. The left adrenal mass measures 3.1 x 2.3 cm. There is a 6 mm probable cyst in the anterior segment of the right lobe of the liver. There are renal cysts bilaterally, more on the right than on the left. There is atherosclerotic calcification in the upper abdominal aorta. There is a mass arising from the left L1 vertebral body extending into the left anterior thecal sac, impressing on the lower cord. This metastatic focus in the L1 vertebral body measures 4.6 x 4.5 cm. This mass extends into the left paraspinous region. There is blastic change in portions of this vertebral body as well. There is a destructive lesion in the left T1 vertebral body. There is a sclerotic lesion in the left T3 vertebral body. Review of the MIP images confirms the above findings. IMPRESSION: No demonstrable pulmonary embolus. There is a dominant mass arising from the posterior segment right upper lobe with invasion of the  adjacent right hilum and mediastinum. There are multiple parenchymal lung metastases, more on the right than on the left. There is airspace consolidation consistent with pneumonia in the right lower lobe with small right effusion. There is adenopathy at multiple sites. There are bilateral adrenal masses consistent with metastases. There are destructive and blastic bony metastases. The largest of these lesions is at L1 on the left which extends into the left paraspinous region as well as into the anterior thecal sac on the left without frank cord/conus invasion. In comparison with the prior study from 6 months prior. There has been significant progression of metastatic disease at multiple sites. Electronically Signed   By: Lowella Grip III M.D.   On: Aug 17, 2015 15:26   Dg Chest Port 1 View  17-Aug-2015  CLINICAL DATA:  Lung carcinoma. Sickle cell disease. Hypotension and weakness EXAM: PORTABLE CHEST 1 VIEW COMPARISON:  Chest CT May 29, 2015; chest radiograph March 06, 2015 FINDINGS: The mass arising in the posterior segment right upper lobe is again noted, currently measuring 7.0 x 5.1 cm. There is surrounding or overlying patchy airspace consolidation. The left lung is clear. Heart size and pulmonary vascular normal. Port-A-Cath tip is in the superior vena cava. No pneumothorax. No adenopathy is discernible on this single view. No blastic or lytic bone lesions are apparent. IMPRESSION: Persistent mass in the  right mid lung region. There is either surrounding or overlying airspace consolidation. There may well be pneumonia overlying this mass, difficult to discern on this single view. Left lung clear. Cardiac silhouette within normal limits. Electronically Signed   By: Lowella Grip III M.D.   On: 01-Sep-2015 12:36    Medications: I have reviewed the patient's current medications.  CODE STATUS: No CODE BLUE  Assessment/Plan: 1) metastatic non-small cell lung cancer, adenocarcinoma currently  undergoing systemic chemotherapy with docetaxel and Cyramza status post 2 cycles. The patient was supposed to start cycle #3 tomorrow. We will hold his treatment for now until improvement of his condition. Unfortunately the recent CT scan of the chest was compared to 6 months ago and does not provide any information about disease progression after the last scan. The patient has very advanced disease and he may benefit from palliative care consult for discussion of goals of care.  2) hypokalemia: Continue potassium supplements as ordered. 3) history of pulmonary embolism: No evidence of pulmonary embolus on the recent scan but the patient will need to continue his treatment with Xarelto for now. 4) severe protein calorie malnutrition: Continue nutritional supplements and a consult with the dietitian regarding any medication needed for his diet. 5) CODE STATUS: No CODE BLUE. Thank you for taking good care of Mr. Brostrom, I will continue to follow up the patient with you and assist in his management on as-needed basis.   LOS: 1 day    Loralai Eisman K. 08/09/2015

## 2015-08-09 NOTE — Clinical Social Work Note (Signed)
Clinical Social Work Assessment  Patient Details  Name: Brendan Hooper MRN: [id number] Date of Birth: 12-03-1945  Date of referral:  08/09/15               Reason for consult:  Discharge Planning                Permission sought to share information with:  Family Supports Permission granted to share information::  Yes, Verbal Permission Granted  Name::     Brendan Hooper  Agency::     Relationship::  sister  Contact Information:  [phone]  Housing/Transportation Living arrangements for the past 2 months:  Hotel/Motel Source of Information:  Other (Comment Required) (sister) Patient Interpreter Needed:  None Criminal Activity/Legal Involvement Pertinent to Current Situation/Hospitalization:    Significant Relationships:  Siblings Lives with:  Self Do you feel safe going back to the place where you live?  No Need for family participation in patient care:  Yes (Comment)  Care giving concerns:  Pt admitted from hotel and pt sister is concerned that pt cannot continue to manage his own care at hotel. Pt sister feels that placement will be necessary upon discharge.   Social Worker assessment / plan:  CSW received referral that pt admitted from [facility] and pt sister at bedside requesting to speak with CSW as pt sister does not feel pt will be able to return to previous living situation.   CSW met with pt and pt sister at bedside. Pt confused during time of visit which pt sister reports is not pt baseline. Pt sister discussed that pt has been living at [facility] many years. Pt sister reports that she does not feel that pt will be able to return to hotel and be able to care for himself and feels that pt will need placement upon discharge. CSW discussed that PT will evaluate and other MDs will be discussing with pt if able and pt sister and pt brother regarding goals of care. Pt sister reports that pt was due to have chemotherapy tomorrow. CSW discussed that CSW will  follow recommendations from consults and initiate SNF search when appropriate if SNF continues to make sense given recommendations. CSW confirmed contact number for pt sister is correct on face sheet and pt sister reports that pt brother is also involved. CSW provided pt sister CSW contact information.  CSW started [facility] and will update once PT evaluates pt. CSW to initiate SNF search once further goals of care are clarified to ensure that SNF plan is most appropriate for pt.   CSW to continue to follow to provide support and assist with pt disposition needs.   Employment status:  Retired Forensic scientist:  Information systems manager, Medicaid In Protection PT Recommendations:  Not assessed at this time [place] / Referral to community resources:  [place]  Patient/Family's Response to care:  Pt alert and oriented to person only. Pt sister supportive. Pt sister reports that pt is not confused at baseline. Pt sister agreeable to assist with decisions if pt remains confused and unable to participate in decision making. Pt sister agreeable to placement if placement makes sense when pt medically ready for discharge.  Patient/Family's Understanding of and Emotional Response to Diagnosis, Current Treatment, and Prognosis:  Pt sister familiar with pt diagnosis and treatment plan for lung cancer. Pt sister reports that pt has been having palliative chemotherapy. Pt sister was eager to speak with Dr. Earlie Server regarding plan and agreeable to meet with other MD's as  well.   Emotional Assessment Appearance:  Appears stated age Attitude/Demeanor/Rapport:  Unable to Assess (Pt confused and unable to participate in assessment) Affect (typically observed):  Unable to Assess Orientation:  Oriented to Self Alcohol / Substance use:  Not Applicable Psych involvement (Current and /or in the community):  No (Comment)  Discharge Needs  Concerns to be addressed:  Discharge Planning Concerns Readmission within the  last 30 days:  No Current discharge risk:  Physical Impairment Barriers to Discharge:  Continued Medical Work up   Alison Murray A, LCSW 08/09/2015, 1:04 PM  [phone]

## 2015-08-09 NOTE — Evaluation (Signed)
Clinical/Bedside Swallow Evaluation Patient Details  Name: Brendan Hooper MRN: [id number] Date of Birth: 07-16-1946  Today's Date: 08/09/2015 Time:        Past Medical History:  Past Medical History  Diagnosis Date  . GERD (gastroesophageal reflux disease)   . Headache(784.0)   . Sickle cell anemia (HCC)   . Lung mass   . Hypertension   . Bronchogenic cancer of right lung ([place]) 08/25/2014  . Brain cancer ([place])     non small cell lung ca with mets to brain   Past Surgical History:  Past Surgical History  Procedure Laterality Date  . No past surgeries    . Portacath placement Left 09/01/2014    Procedure: INSERTION PORT-A-CATH;  Surgeon: Melrose Nakayama, MD;  Location: [facility] OR;  Service: Thoracic;  Laterality: Left;   HPI:  69 y.o. 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 History of sickle cell, GERD, essential hypertension admitted with chief complaints of generalized weakness. Chest CT dominant mass arising from the posterior segment right upper lobe with invasion of the adjacent right hilum and mediastinum. There is airspace consolidation consistent with pneumonia in the right lower lobe with small right effusion. Multiple parenchyma. No prior ST documentation found.   Assessment / Plan / Recommendation Clinical Impression  Pt lethargic with brief periods of arousal following verbal and tactile stimuli. Decreased oral manipulation with ice chip, unable to maintain aequate alertness. Oral cavity checked for residue and assessment stopped. RN and tech both report pt alert this morning and consumed liquids via straw without apparent difficulty. Overall decreased edurance; downgrade diet to Dys 3, continue  thin, meds whole in applesauce only when alert. ST will follow up for safety and efficiency.      Aspiration Risk  Moderate aspiration risk    Diet Recommendation Dysphagia 3 (Mech soft);Thin liquid   Medication Administration: Whole meds with puree Supervision: Full supervision/cueing for compensatory strategies;Staff to assist with self feeding Compensations: Small sips/bites;Slow rate    Other  Recommendations Oral Care Recommendations: Oral care QID   Follow up Recommendations  None    Frequency and Duration min 2x/week  2 weeks       Prognosis Prognosis for Safe Diet Advancement: Fair Barriers to Reach Goals: Cognitive deficits (medical prognosis)      Swallow Study   General HPI: 69 y.o. 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 History of sickle cell, GERD, essential hypertension admitted with chief complaints of generalized weakness. Chest CT dominant mass arising from the posterior segment right upper lobe with invasion of the adjacent right hilum and mediastinum. There is airspace consolidation consistent with pneumonia in the right lower lobe with small right effusion. Multiple parenchyma. No prior ST documentation found. Previous Swallow Assessment:  (none found) Respiratory Status: Room air History of Recent Intubation: No Behavior/Cognition: Lethargic/Drowsy;Requires cueing Oral Care Completed by SLP: Yes Oral Cavity - Dentition:  (difficult to fully assess) Baseline Vocal Quality: Low vocal intensity Volitional Cough: Cognitively unable to elicit Volitional Swallow: Unable to  elicit    Oral/Motor/Sensory Function Overall Oral Motor/Sensory Function:  (too lethargic to adequately  assess)   Ice Chips Ice chips: Impaired Presentation: Spoon Oral Phase Impairments: Reduced labial seal;Poor awareness of bolus Oral Phase Functional Implications: Oral holding Pharyngeal Phase Impairments:  (no pharyngeal swallow initiated)   Thin Liquid Thin Liquid: Not tested    Nectar Thick Nectar Thick Liquid: Not tested   Honey Thick Honey Thick Liquid: Not tested   Puree Puree: Not tested   Solid Solid: Not tested       Brendan Hooper 08/09/2015,12:40 PM  Brendan Hooper.Ed [employer] [phone]

## 2015-08-09 NOTE — Evaluation (Signed)
Physical Therapy Evaluation Patient Details Name: Brendan Hooper MRN: [id number] DOB: [birth year] Today's Date: 08/09/2015   History of Present Illness  Mr.  Brendan Hooper is a 69 y.o. 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 Pt currently with functional limitations due to the deficits listed below (see PT Problem List). Pt will benefit from skilled PT to increase their independence and safety with mobility to allow discharge to the venue listed below.  Palliative Care consult for Goals of care is pending. MD notes indicate longterm prognosis is poor. Evaluation limited , patient incontinent of BM, requires 2 persons to roll as patient is rigid and resists.      Follow Up Recommendations SNF;Supervision/Assistance - 24 hour (if able to participate in mobility )    Equipment Recommendations       Recommendations for Other Services       Precautions / Restrictions Precautions Precautions: Fall Precaution Comments: skin lesions/blisters abound      Mobility  Bed Mobility Overal bed mobility: Needs Assistance;+2 for physical assistance;+ 2 for safety/equipment Bed Mobility: Rolling Rolling: Total assist;+2 for physical assistance;+2 for safety/equipment         General bed mobility comments: patient is very resisitive to turning and pushes back on rail, legs extended. Rolled to left and right for  change and clean up from BM.  Transfers                  General transfer comment: NT  Ambulation/Gait                Stairs            Wheelchair Mobility    Modified Rankin (Stroke Patients Only)       Balance                                             Pertinent Vitals/Pain Pain Assessment: Faces Faces Pain Scale: Hurts even more Pain Location: 'My legs" Pain Descriptors / Indicators: Grimacing;Guarding Pain Intervention(s): Monitored during session;Repositioned;Limited activity within patient's tolerance    Home Living Family/patient expects to be discharged to::  (lived in hotel w/ roommate)                      Prior Function           Comments: uncertain of need for assistance other than patient could not get OOB on day of admit.     Hand Dominance        Extremity/Trunk Assessment   Upper Extremity Assessment: Difficult to assess due to impaired cognition;RUE deficits/detail;LUE deficits/detail RUE Deficits / Details: hands mitted for safety, moves arms about.     LUE Deficits / Details: same as R   Lower Extremity Assessment: Difficult to assess due to impaired cognition;RLE deficits/detail;LLE deficits/detail RLE Deficits / Details: keeps leg extended, flexed hip and knee with much encouragement, then  extended the legs again, especially during turning.    Cervical / Trunk Assessment: Other exceptions  Communication      Cognition Arousal/Alertness: Awake/alert   Overall Cognitive Status: Impaired/Different from baseline Area of Impairment: Orientation;Following commands;Awareness               General Comments: does not follow commands    General Comments      Exercises        Assessment/Plan    PT Assessment Patient needs continued PT services (unless GOC are for comfort. )  PT Diagnosis Generalized weakness;Acute pain;Altered mental status   PT Problem List Decreased strength;Decreased range of motion;Decreased activity  tolerance;Decreased balance;Decreased mobility;Decreased cognition;Decreased knowledge of precautions;Decreased skin integrity;Pain  PT Treatment Interventions Functional mobility training;Therapeutic activities;Therapeutic exercise;Balance training;Patient/family education   PT Goals (Current goals can be found in the Care Plan section) Acute Rehab PT Goals Patient Stated Goal: not stated PT Goal Formulation: Patient unable to participate in goal setting Time For Goal Achievement: 08/23/15 Potential to Achieve Goals: Fair    Frequency Min 2X/week   Barriers to discharge Decreased caregiver support      Co-evaluation               End of Session   Activity Tolerance: No increased pain;Patient limited by fatigue Patient left: in bed;with bed alarm set;with call bell/phone within reach;with nursing/sitter in room Nurse Communication: Mobility status         Time: 0208-0229 PT Time Calculation (min) (ACUTE ONLY): 21 min   Charges:   PT Evaluation $Initial PT Evaluation Tier I: 1 Procedure     PT G CodesClaretha Hooper 08/09/2015, 2:43 PM

## 2015-08-09 NOTE — Progress Notes (Signed)
[facility] PROGRESS NOTE  Brendan Hooper ZDG:[id number] DOB: [birth year] DOA: 08/14/2015 PCP: [name], MD  Assessment/Plan: 1. Postobstructive right-sided pneumonia.  -We'll plan on continuing IV antibiotics   - Long-term prognosis extremely poor.   2. 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 Discussed his case with Dr. [name], he has failed chemotherapy and immunotherapy as dictated in history of present illness, currently on palliative chemotherapy, long-term prognosis poor, I discussed this with Dr. [name] and with patient, he is DO NOT RESUSCITATE - Palliative care on board and assisting with planning     3. Recent diagnosis of PE. On xaralto continue pharmacy to dose, latest CT scan shows resolution of PE, likely minimum 6 months of treatment as he is hypercoagulable from malignancy.   4. History of bullous pemphigoid/Toxic epidermal Necrolysis - abortive care with wound care, apply nystatin cream to open blisters.   5. Severe dehydration, severe protein calorie malnutrition and hypokalemia. Replace potassium, 2 L IV fluid bolus along with maintenance IV fluids. Consulted dietitian. Placed on pro-stat.   6. Generalized weakness.  - continue PT.  Code StatusDO NOT RESUSCITATE  Family Communication: none at bedside Disposition Plan:  barriers to discharge: Respiratory condition and disposition    Consultants:  Palliative care  Procedures:   none   Antibiotics:  Unasyn  Azithromycin  HPI/Subjective: Pt has no new complaints. No acute issues overnight.  Objective: Filed Vitals:   08/09/15 0434 08/09/15 1313  BP: 146/77 98/60  Pulse: 123 126  Temp: 97.4 F (36.3 C) 97.6 F (36.4 C)  Resp: 18 17    Intake/Output Summary (Last 24 hours) at 08/09/15 1711 Last data filed at 08/09/15 1702  Gross per 24 hour  Intake    520 ml  Output    425 ml  Net     95 ml   Filed Weights   08-14-2015  1710  Weight: 60.2 kg (132 lb 11.5 oz)    Exam:   General:  Pt in nad, alert and awake  Cardiovascular: rrr, no rubs  Respiratory: Equal chest rise, no wheezes  Abdomen: soft, no guarding, nondistended   Musculoskeletal: no cyanosis on limited exam, equal tone  Data Reviewed: Basic Metabolic Panel:  Recent Labs Lab 08/03/15 1138 Aug 14, 2015 1247 08/09/15 1400  NA 143 146* 149*  K 3.1* 2.8* 2.8*  CL  --  108 111  CO2 '24 25 27  '$ GLUCOSE 120 114* 114*  BUN 10.6 11 <5*  CREATININE 0.8 0.72 0.55*  CALCIUM 10.6* 10.1 9.4   Liver Function Tests:  Recent Labs Lab 08/03/15 1138 08/14/2015 1247  AST 25 25  ALT 15 12*  ALKPHOS 82 59  BILITOT 0.59 1.0  PROT 7.0 6.5  ALBUMIN 3.3* 3.4*   No results for input(s): LIPASE, AMYLASE in the last 168 hours. No results for input(s): AMMONIA in the last 168 hours. CBC:  Recent Labs Lab 08/03/15 1138 08-14-2015 1247  WBC 7.4 9.2  NEUTROABS 5.2 5.9  HGB 14.9 14.4  HCT 42.5 40.9  MCV 80.8 80.2  PLT 258 259   Cardiac Enzymes: No results for input(s): CKTOTAL, CKMB, CKMBINDEX, TROPONINI in the last 168 hours. BNP (last 3 results) No results for input(s): BNP in the last 8760 hours.  ProBNP (last 3 results) No results for input(s): PROBNP in the last 8760 hours.  CBG: No results for input(s): GLUCAP in the last 168 hours.  Recent Results (from the past 240 hour(s))  Blood  culture (routine x 2)     Status: None (Preliminary result)   Collection Time: 08/12/2015 12:45 PM  Result Value Ref Range Status   Specimen Description BLOOD LEFT FOREARM  Final   Special Requests IN PEDIATRIC BOTTLE 4ML  Final   Culture   Final    NO GROWTH < 24 HOURS Performed at [facility]    Report Status PENDING  Incomplete  Blood culture (routine x 2)     Status: None (Preliminary result)   Collection Time: 08-12-15  7:14 PM  Result Value Ref Range Status   Specimen Description BLOOD LEFT ARM  Final   Special Requests BOTTLES DRAWN  AEROBIC AND ANAEROBIC [place]  Final   Culture   Final    NO GROWTH < 24 HOURS Performed at [facility]    Report Status PENDING  Incomplete  MRSA PCR Screening     Status: None   Collection Time: 08/12/15  9:21 PM  Result Value Ref Range Status   MRSA by PCR NEGATIVE NEGATIVE Final    Comment:        The GeneXpert MRSA Assay (FDA approved for NASAL specimens only), is one component of a comprehensive MRSA colonization surveillance program. It is not intended to diagnose MRSA infection nor to guide or monitor treatment for MRSA infections.      Studies: Ct Angio Chest Pe W/cm &/or Wo Cm  12-Aug-2015  CLINICAL DATA:  Shortness of breath and chest pain. Known lung carcinoma. EXAM: CT ANGIOGRAPHY CHEST WITH CONTRAST TECHNIQUE: Multidetector CT imaging of the chest was performed using the standard protocol during bolus administration of intravenous contrast. Multiplanar CT image reconstructions and MIPs were obtained to evaluate the vascular anatomy. CONTRAST:  54m OMNIPAQUE IOHEXOL 350 MG/ML SOLN COMPARISON:  Chest radiograph D12/24/16and chest CT January 27, 2015 FINDINGS: There is no demonstrable pulmonary embolus. There is atherosclerotic calcification in the aorta. There is no thoracic aortic aneurysm or dissection. The visualized great vessels appear normal except for foci of atherosclerotic calcification near the origins of these vessels. There is underlying emphysematous change. The mass arising from the posterior segment of the right lobe of the liver extends into the adjacent right mediastinum and right hilar regions. This mass currently measures 7.9 x 7.7 x 5.0 cm. This mass has irregular lobulated borders. There are several adjacent smaller masses which are partially discernible from the main dominant mass. The largest of these adjacent masses measures 1.6 x 1.4 cm. Posterior to this dominant mass, there is airspace consolidation in the superior and posterior segments  of the right lower lobe. There is a small right pleural effusion. There are multiple scattered metastatic lesions throughout the right lung. The largest of these metastatic foci is in the posterior segment right lower lobe near the right hemidiaphragm measuring 1.3 x 1.3 cm. There are multiple subcentimeter nodular lesions in the right upper and middle lobe regions consistent with metastatic foci. There is also a 7 x 6 mm nodular lesion consistent with a metastatic focus in the anterior segment of the left upper lobe. There is underlying emphysematous change. Thyroid appears normal. There is right hilar adenopathy with the largest right hilar lymph node measuring 1.9 x 1.8 cm. There is a sub- carinal lymph node measuring 2.1 x 1.5 cm. There is a left hilar lymph node measuring 1.4 x 0.9 cm. There is a lymph node to the left of the lower trachea measuring 1.3 x 1.0 cm. Pericardium is not  thickened. In the visualized upper abdomen, there are adrenal masses bilaterally consistent with metastases. The right adrenal mass measures 3.6 x 2.9 cm. The left adrenal mass measures 3.1 x 2.3 cm. There is a 6 mm probable cyst in the anterior segment of the right lobe of the liver. There are renal cysts bilaterally, more on the right than on the left. There is atherosclerotic calcification in the upper abdominal aorta. There is a mass arising from the left L1 vertebral body extending into the left anterior thecal sac, impressing on the lower cord. This metastatic focus in the L1 vertebral body measures 4.6 x 4.5 cm. This mass extends into the left paraspinous region. There is blastic change in portions of this vertebral body as well. There is a destructive lesion in the left T1 vertebral body. There is a sclerotic lesion in the left T3 vertebral body. Review of the MIP images confirms the above findings. IMPRESSION: No demonstrable pulmonary embolus. There is a dominant mass arising from the posterior segment right upper lobe with  invasion of the adjacent right hilum and mediastinum. There are multiple parenchymal lung metastases, more on the right than on the left. There is airspace consolidation consistent with pneumonia in the right lower lobe with small right effusion. There is adenopathy at multiple sites. There are bilateral adrenal masses consistent with metastases. There are destructive and blastic bony metastases. The largest of these lesions is at L1 on the left which extends into the left paraspinous region as well as into the anterior thecal sac on the left without frank cord/conus invasion. In comparison with the prior study from 6 months prior. There has been significant progression of metastatic disease at multiple sites. Electronically Signed   By: Lowella Grip III M.D.   On: 08-25-2015 15:26   Dg Chest Port 1 View  2015-08-25  CLINICAL DATA:  Lung carcinoma. Sickle cell disease. Hypotension and weakness EXAM: PORTABLE CHEST 1 VIEW COMPARISON:  Chest CT May 29, 2015; chest radiograph March 06, 2015 FINDINGS: The mass arising in the posterior segment right upper lobe is again noted, currently measuring 7.0 x 5.1 cm. There is surrounding or overlying patchy airspace consolidation. The left lung is clear. Heart size and pulmonary vascular normal. Port-A-Cath tip is in the superior vena cava. No pneumothorax. No adenopathy is discernible on this single view. No blastic or lytic bone lesions are apparent. IMPRESSION: Persistent mass in the right mid lung region. There is either surrounding or overlying airspace consolidation. There may well be pneumonia overlying this mass, difficult to discern on this single view. Left lung clear. Cardiac silhouette within normal limits. Electronically Signed   By: Lowella Grip III M.D.   On: 2015-08-25 12:36    Scheduled Meds: . ampicillin-sulbactam (UNASYN) IV  3 g Intravenous Q6H  . azithromycin  500 mg Intravenous Q24H  . [START ON 08/11/2015] cholecalciferol  1,000  Units Oral Weekly  . [START ON 08/10/2015] feeding supplement (ENSURE ENLIVE)  237 mL Oral Once per day on Mon Thu  . feeding supplement (PRO-STAT SUGAR FREE 64)  30 mL Oral TID WC  . folic acid  1 mg Oral Daily  . hydrocerin   Topical BID  . nystatin cream   Topical TID  . potassium chloride SA  20 mEq Oral Daily  . rivaroxaban  20 mg Oral Q supper  . [START ON 08/11/2015] thiamine  100 mg Oral Weekly   Continuous Infusions:   Time spent: > 35 minutes  Cal Gindlesperger, [employer]  [facility] Pager [phone] If 7PM-7AM, please contact night-coverage at [email or web address], password [facility] 08/09/2015, 5:11 PM  LOS: 1 day

## 2015-08-09 NOTE — Consult Note (Signed)
Consultation Note Date: 08/09/2015   Patient Name: Brendan Hooper  DOB: 1946-03-22  MRN: [id number]  Age / Sex: 69 y.o., male   PCP: [name], MD Referring Physician: Velvet Bathe, MD  Reason for Consultation: Establishing goals of care and Psychosocial/spiritual support  Palliative Care Assessment and Plan Summary of Established Goals of Care and Medical Treatment Preferences   Clinical Assessment/Narrative: Brendan Hooper is resting in bed, he is sitting up looking around the room. He asks me to take the mittens off. He is unable to communicate in any meaningful way.  Call to his sister/HCPOA Brendan Hooper.  She tells me that he does not have a legal guardian,  We talk about his estranged wife and children.  Brendan Hooper shares that he was very weak last Thursday and that this is the first time she has noticed his confusion. We talk about his cancer diagnosis and prognosis.  She shares that her brother did not want treatments when he was diagnosed 3 years ago, and that he didn't want to come to the hospital 2 years ago.  She tells me that she knows the treatments he is getting at this time is palliative/not curative.  "I know his prognosis is poor".     We talk about IV fluid and dehydration.  She shares that she has seen him come in and out of the hospital a few times with dehydration and that she know dehydration will happen again.  We talk about intake with food and fluids.  We talk about post obstructive PNE and that this is rt his cancer.  She shares, "i can see gradually the decline with in the last 2 weeks."    We talk about what is important to him and Brendan Hooper states that the last few years he has been a heavy smoker and drinker, but no activities.  I ask if he has talked with her about end of life issues, and she shares that he has not discussed end of life, but they have talked about his burial plans.  We discuss the trial of 24-48 hours to see if he is able to improve.  We also talk about the  concept of inpatient Hospice.  Brendan Hooper states she has had family members in hospice and they are accepting of this service if needed.    Contacts/Participants in Discussion: Primary Decision Maker: Brendan Hooper is unable to make his own decisions at this time d/t confusion.  HCPOA: yes  Shared with sister, Brendan Hooper, and brother, Brendan Hooper.   Code Status/Advance Care Planning:  DNR  Symptom Management:   Zofran 4 mg IV Q 6 hours PRN  Palliative Prophylaxis: None at this time, no narcotics.   Psycho-social/Spiritual:   Support System: Lives in [facility], has a room mate.  Sister Brendan Hooper supportive and involved in care/drives.  Brother Brendan Hooper available to help.   Estranged wife and 2 children in town.   Desire for further Chaplaincy support:  Not discussed today.   Prognosis: Unable to determine, based on outcomes.   Discharge Planning:  Based on outcomes.  SNF for rehab if able vs residential hospice.        Chief Complaint:   Weakness History of Present Illness:   Brendan Hooper is a 69 y.o. 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  Primary Diagnoses  Present on Admission:  . Alcohol abuse . Postobstructive pneumonia . Protein-calorie malnutrition, severe ([place]) . Bronchogenic cancer of right lung ([place]) . Brain metastases ([place]) . Pulmonary embolus ([place]) . 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 Dehydration . Hypokalemia . Bullous pemphigoid  Palliative Review of Systems: Brendan Hooper is unable to participate in ROS today. He denies pain.  I have reviewed the medical record, interviewed the patient and family, and examined the patient. The following aspects are pertinent.  Past Medical History  Diagnosis Date  . GERD (gastroesophageal reflux disease)   . Headache(784.0)   . Sickle cell anemia (HCC)   . Lung mass   . Hypertension   . Bronchogenic cancer of right lung ([place]) 08/25/2014  . Brain cancer ([place])     non small cell lung ca with mets to brain   Social History   Social History  . Marital Status: Single    Spouse Name: N/A  . Number of Children: N/A  . Years of Education: N/A   Social History Main Topics  . Smoking status: Current Every Day Smoker -- 0.50 packs/day for 40 years    Types: Cigarettes    Start date: 08/19/1957  . Smokeless tobacco: Never Used  . Alcohol Use: 0.0 oz/week    0 Standard drinks or equivalent per week     Comment: social  . Drug Use: No  . Sexual Activity: Not Asked   Other Topics Concern  . None   Social History Narrative   Family History  Problem Relation Age of Onset  . Cancer Mother   . Stroke Father    Scheduled Meds: . ampicillin-sulbactam (UNASYN) IV  3 g Intravenous Q6H  . azithromycin  500 mg Intravenous Q24H  . [START ON 08/11/2015] cholecalciferol  1,000 Units Oral Weekly  . [START ON 08/10/2015] feeding supplement (ENSURE ENLIVE)  237 mL Oral Once per day on Mon Thu  .  feeding supplement (PRO-STAT SUGAR FREE 64)  30 mL Oral TID WC  . folic acid  1 mg Oral Daily  . hydrocerin   Topical BID  . nystatin cream   Topical TID  . potassium chloride SA  20 mEq Oral Daily  . rivaroxaban  20 mg Oral Q supper  . [START ON 08/11/2015] thiamine  100 mg Oral Weekly   Continuous Infusions:  PRN Meds:.albuterol, alum & mag hydroxide-simeth, lidocaine-prilocaine, ondansetron (ZOFRAN) IV Medications Prior to Admission:  Prior to Admission medications   Medication Sig Start Date End Date Taking? Authorizing Provider  alum & mag hydroxide-simeth (MAALOX/MYLANTA) 200-200-20 MG/5ML suspension Take 30 mLs by mouth every 6 (six) hours as needed for indigestion or heartburn (dyspepsia). 03/07/15  Yes Brendan Lis, MD  cholecalciferol (VITAMIN D)  1000 UNITS tablet Take 1,000 Units by mouth once a week.    Yes Historical Provider, MD  feeding supplement, ENSURE ENLIVE, (ENSURE ENLIVE) LIQD Take 237 mLs by mouth 3 (three) times daily between meals. Patient taking differently: Take 237 mLs by mouth 2 (two) times a week.  03/07/15  Yes Brendan Lis, MD  folic acid (FOLVITE) 1 MG tablet Take 1 tablet (1 mg total) by mouth daily. 08/11/14  Yes Brendan Jansky, MD  lidocaine-prilocaine (EMLA) cream Apply 1 application topically as needed. Patient taking differently: Apply 1 application topically as needed (port access).  09/02/14  Yes Brendan Bears, MD  Multiple Vitamin (MULTIVITAMIN WITH MINERALS) TABS tablet Take 1 tablet by mouth daily. Patient taking differently: Take 1 tablet by mouth once a week.  08/11/14  Yes Brendan Jansky, MD  ondansetron (ZOFRAN) 4 MG tablet Take 1 tablet (4 mg total) by mouth every 6 (six) hours as needed for nausea. 03/07/15  Yes Brendan Lis, MD  oxyCODONE (OXY IR/ROXICODONE) 5 MG immediate release tablet Take 1-2 tablets (5-10 mg total) by mouth every 4 (four) hours as needed for moderate pain. 07/18/15  Yes Brendan Bears, MD  potassium chloride SA  (K-DUR,KLOR-CON) 20 MEQ tablet Take 1 tablet (20 mEq total) by mouth daily. 08/04/15  Yes Brendan Bears, MD  rivaroxaban (XARELTO) 20 MG TABS tablet Take 1 tablet (20 mg total) by mouth daily with supper. 06/14/15  Yes Brendan Bears, MD  thiamine 100 MG tablet Take 1 tablet (100 mg total) by mouth daily. Patient taking differently: Take 100 mg by mouth once a week.  08/11/14  Yes Brendan Jansky, MD  acetaminophen (TYLENOL) 325 MG tablet Take 2 tablets (650 mg total) by mouth every 6 (six) hours as needed for mild pain (or Fever >/= 101). 03/07/15   Brendan Lis, MD  dexamethasone (DECADRON) 4 MG tablet Take 2 tablets (8 mg total) by mouth 2 (two) times daily. Take 2 tablet the day before teh day of and the day after chemo. 06/14/15   Brendan Bears, MD  diphenhydrAMINE (BENADRYL) 25 MG tablet Take 25 mg by mouth every 6 (six) hours as needed for itching or allergies.     Historical Provider, MD  prochlorperazine (COMPAZINE) 10 MG tablet Take 1 tablet (10 mg total) by mouth every 6 (six) hours as needed for nausea or vomiting. 06/14/15   Brendan Bears, MD   No Known Allergies CBC:    Component Value Date/Time   WBC 9.2 08/28/2015 1247   WBC 7.4 08/03/2015 1138   HGB 14.4 2015/08/28 1247   HGB 14.9 08/03/2015 1138   HCT 40.9 08-28-2015 1247   HCT 42.5 08/03/2015 1138   PLT 259 08-28-15 1247   PLT 258 08/03/2015 1138   MCV 80.2 08-28-2015 1247   MCV 80.8 08/03/2015 1138   NEUTROABS 5.9 2015-08-28 1247   NEUTROABS 5.2 08/03/2015 1138   LYMPHSABS 1.0 August 28, 2015 1247   LYMPHSABS 1.2 08/03/2015 1138   MONOABS 1.3* 08/28/15 1247   MONOABS 0.9 08/03/2015 1138   EOSABS 0.9* 08-28-2015 1247   EOSABS 0.1 08/03/2015 1138   BASOSABS 0.0 Aug 28, 2015 1247   BASOSABS 0.0 08/03/2015 1138   Comprehensive Metabolic Panel:    Component Value Date/Time   NA 146* 2015/08/28 1247   NA 143 08/03/2015 1138   K 2.8* 28-Aug-2015 1247   K 3.1* 08/03/2015 1138   CL 108 28-Aug-2015 1247   CO2 25  Aug 28, 2015 1247   CO2 24 08/03/2015 1138  BUN 11 2015/09/02 1247   BUN 10.6 08/03/2015 1138   CREATININE 0.72 September 02, 2015 1247   CREATININE 0.8 08/03/2015 1138   GLUCOSE 114* 09-02-2015 1247   GLUCOSE 120 08/03/2015 1138   CALCIUM 10.1 September 02, 2015 1247   CALCIUM 10.6* 08/03/2015 1138   AST 25 September 02, 2015 1247   AST 25 08/03/2015 1138   ALT 12* 09/02/15 1247   ALT 15 08/03/2015 1138   ALKPHOS 59 02-Sep-2015 1247   ALKPHOS 82 08/03/2015 1138   BILITOT 1.0 09/02/2015 1247   BILITOT 0.59 08/03/2015 1138   PROT 6.5 September 02, 2015 1247   PROT 7.0 08/03/2015 1138   ALBUMIN 3.4* 09-02-15 1247   ALBUMIN 3.3* 08/03/2015 1138    Physical Exam: Vital Signs: BP 98/60 mmHg  Pulse 126  Temp(Src) 97.6 F (36.4 C) (Oral)  Resp 17  Wt 60.2 kg (132 lb 11.5 oz)  SpO2 99% SpO2: SpO2: 99 % O2 Device: O2 Device: Not Delivered O2 Flow Rate:   Intake/output summary:  Intake/Output Summary (Last 24 hours) at 08/09/15 1321 Last data filed at 08/09/15 1316  Gross per 24 hour  Intake   1460 ml  Output    425 ml  Net   1035 ml   LBM: Last BM Date: 2015/09/02 Baseline Weight: Weight: 60.2 kg (132 lb 11.5 oz) Most recent weight: Weight: 60.2 kg (132 lb 11.5 oz)  Exam Findings:  Constitutional:  Frail, thin, lying in bed, makes but does not keep eye contact.  Resp: Even and non labored.  GI: abd soft flat, non tender.           Palliative Performance Scale: 30% at best               Additional Data Reviewed: Recent Labs     September 02, 2015  1247  WBC  9.2  HGB  14.4  PLT  259  NA  146*  BUN  11  CREATININE  0.72     Time In: 1320 Time Out: 1440 Time Total:  80 minutes Greater than 50%  of this time was spent counseling and coordinating care related to the above assessment and plan. GOC discussion shared with nursing staff, SW, and Dr. Wendee Beavers.   Signed by: Drue Novel, NP  Drue Novel, NP  08/09/2015, 1:21 PM  Please contact Palliative Medicine Team phone at [phone] for questions  and concerns.

## 2015-08-10 ENCOUNTER — Ambulatory Visit: Payer: Medicare Other

## 2015-08-10 ENCOUNTER — Other Ambulatory Visit: Payer: Medicare Other

## 2015-08-10 DIAGNOSIS — C7931 Secondary malignant neoplasm of brain: Secondary | ICD-10-CM

## 2015-08-10 DIAGNOSIS — Z515 Encounter for palliative care: Secondary | ICD-10-CM | POA: Insufficient documentation

## 2015-08-10 LAB — BASIC METABOLIC PANEL
Anion gap: 12 (ref 5–15)
CHLORIDE: 109 mmol/L (ref 101–111)
CO2: 27 mmol/L (ref 22–32)
Calcium: 9.4 mg/dL (ref 8.9–10.3)
Creatinine, Ser: 0.55 mg/dL — ABNORMAL LOW (ref 0.61–1.24)
GFR calc Af Amer: >60 mL/min (ref >60)
GFR calc non Af Amer: >60 mL/min (ref >60)
GLUCOSE: 115 mg/dL — AB (ref 65–99)
POTASSIUM: 2.5 mmol/L — AB (ref 3.5–5.1)
Sodium: 148 mmol/L — ABNORMAL HIGH (ref 135–145)

## 2015-08-10 LAB — C DIFFICILE QUICK SCREEN W PCR REFLEX
C Diff antigen: NEGATIVE
C Diff interpretation: NEGATIVE
C Diff toxin: NEGATIVE

## 2015-08-10 LAB — MAGNESIUM: MAGNESIUM: 1.1 mg/dL — AB (ref 1.7–2.4)

## 2015-08-10 LAB — POTASSIUM: POTASSIUM: 3.2 mmol/L — AB (ref 3.5–5.1)

## 2015-08-10 MED ORDER — POTASSIUM CHLORIDE 10 MEQ/100ML IV SOLN
10.0000 meq | INTRAVENOUS | Status: AC
  Administered 2015-08-10 (×5): 10 meq via INTRAVENOUS
  Filled 2015-08-10 (×5): qty 100

## 2015-08-10 MED ORDER — AZITHROMYCIN 250 MG PO TABS
500.0000 mg | ORAL_TABLET | Freq: Every day | ORAL | Status: DC
  Administered 2015-08-10: 500 mg via ORAL
  Filled 2015-08-10: qty 2

## 2015-08-10 MED ORDER — MORPHINE SULFATE (CONCENTRATE) 10 MG/0.5ML PO SOLN: 2.5000 mg | ORAL | Status: DC | PRN

## 2015-08-10 NOTE — Progress Notes (Signed)
CRITICAL VALUE ALERT  Critical value received:  Potassium 2.5  Date of notification:  08/10/15  Time of notification:  4383  Critical value read back:Yes.    Nurse who received alert:  Hortencia Conradi RN   MD notified (1st page):  Lamar Blinks NP   Time of first page:  [phone]  MD notified (2nd page):  Time of second page:  Responding MD:  Lamar Blinks NP  Time MD responded:

## 2015-08-10 NOTE — Progress Notes (Signed)
Speech Language Pathology Treatment: Dysphagia  Patient Details Name: Brendan Hooper MRN: [id number] DOB: 1946-07-18 Today's Date: 08/10/2015 Time: 1761-6073 SLP Time Calculation (min) (ACUTE ONLY): 14 min  Assessment / Plan / Recommendation Clinical Impression  Mr. Koppen was awake and participatory today. Straw sips thin resulted in consistent delayed throat clear mitigated with cup sips. Mastication and transit of solid was functional, however given cognitive status and progressive illness, recommend continue Dys 3 texture and thin liquids, cup sips, meds whole in applesauce. No further ST recommended.    HPI HPI: 69 y.o. 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 History of sickle cell, GERD, essential hypertension admitted with chief complaints of generalized weakness. Chest CT dominant mass arising from the posterior segment right upper lobe with invasion of the adjacent right hilum and mediastinum. There is airspace consolidation consistent with pneumonia in the right lower lobe with small right effusion. Multiple parenchyma. No prior ST documentation found.      SLP Plan  Continue with current plan of care     Recommendations  Diet recommendations: Dysphagia 3 (mechanical soft);Thin liquid Liquids provided via: Cup;No straw Medication Administration: Whole meds with puree Supervision: Full supervision/cueing for compensatory strategies;Staff to assist with self feeding;Patient able to self feed Compensations: Slow rate;Small sips/bites Postural Changes and/or Swallow Maneuvers: Seated upright 90 degrees              Oral Care Recommendations: Oral care QID Follow up Recommendations: None Plan:  Continue with current plan of care   Houston Siren 08/10/2015, 12:03 PM   Orbie Pyo Colvin Caroli.Ed [employer] [phone]

## 2015-08-10 NOTE — Progress Notes (Signed)
Initial Nutrition Assessment  DOCUMENTATION CODES:   Severe malnutrition in context of chronic illness, Underweight  INTERVENTION:   -Continue Ensure Enlive po twice weekly, each supplement provides 350 kcal and 20 grams of protein -Continue Prostat liquid protein PO 30 ml TID with meals, each supplement provides 100 kcal, 15 grams protein. -RD to continue to monitor for plan  NUTRITION DIAGNOSIS:   Malnutrition related to chronic illness as evidenced by percent weight loss, severe depletion of body fat, severe depletion of muscle mass.  GOAL:   Patient will meet greater than or equal to 90% of their needs  MONITOR:   PO intake, Supplement acceptance, Labs, Weight trends, Skin, I & O's, Other (Comment) (GOC)  REASON FOR ASSESSMENT:   Consult Assessment of nutrition requirement/status  ASSESSMENT:   69 y.o. 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   Pt in room with no family at bedside. Pt confused and unable to provide history. Pt with mitts on his hands for safety.  Pt currently on dysphagia 3 diet, eating variably 5-100%. Pt has been ordered Ensure and Prostat TID.  Pt with some weight gain since August, overall weight loss of 18 lb since May (12% weight loss x 7 months, significant for time frame).  Pt severely malnourished in fat and muscle regions.   Palliative following for GOC to be  decided within 24-48 hours.  Labs reviewed: Elevated Na Very low K Low BUN & creatinine   Diet Order:  DIET DYS 3 Room service appropriate?: Yes; Fluid consistency:: Thin  Skin:  Reviewed, no issues  Last BM:  12/21  Height:   Ht Readings from Last 1 Encounters:  08/10/15 6' (1.829 m)    Weight:   Wt Readings from Last 1 Encounters:  08/10/15 132 lb 11.5 oz (60.201 kg)    Ideal Body Weight:  80.9 kg  BMI:  Body mass index is 18 kg/(m^2).  Estimated Nutritional Needs:   Kcal:  1800-2000  Protein:  90-100g  Fluid:  1.9L/day  EDUCATION NEEDS:   No education needs identified at this time  Clayton Bibles, MS, RD, LDN Pager: [phone] After Hours Pager: [phone]

## 2015-08-10 NOTE — Clinical Social Work Placement (Signed)
   CLINICAL SOCIAL WORK PLACEMENT  NOTE  Date:  08/10/2015  Patient Details  Name: Isador Castille MRN: [id number] Date of Birth: [birth year]  Clinical Social Work is seeking post-discharge placement for this patient at the [place] level of care (*CSW will initial, date and re-position this form in  chart as items are completed):  Yes   Patient/family provided with [place] Work Department's list of facilities offering this level of care within the geographic area requested by the patient (or if unable, by the patient's family).  Yes   Patient/family informed of their freedom to choose among providers that offer the needed level of care, that participate in Medicare, Medicaid or managed care program needed by the patient, have an available bed and are willing to accept the patient.  Yes   Patient/family informed of [facility]'s ownership interest in [facility] and [facility], as well as of the fact that they are under no obligation to receive care at these facilities.  PASRR submitted to EDS on 08/10/15     PASRR number received on 08/10/15     Existing PASRR number confirmed on       FL2 transmitted to all facilities in geographic area requested by pt/family on 08/10/15     FL2 transmitted to all facilities within larger geographic area on       Patient informed that his/her managed care company has contracts with or will negotiate with certain facilities, including the following:        Yes   Patient/family informed of bed offers received.  Patient chooses bed at       Physician recommends and patient chooses bed at      Patient to be transferred to   on  .  Patient to be transferred to facility by       Patient family notified on   of transfer.  Name of family member notified:        PHYSICIAN Please sign FL2, Please sign DNR     Additional Comment:    _______________________________________________ Ladell Pier,  LCSW 08/10/2015, 3:02 PM

## 2015-08-10 NOTE — Progress Notes (Signed)
CSW continuing to follow.   CSW spoke with Palliative NP, Quinn Axe this morning who spoke with pt sister, Vaughan Basta regarding pt disposition plan. Per Palliative NP discussion with pt sister, pt sister is agreeable for pt to go to SNF for rehab if he is able to participate. It has been discussed with pt sister that if he is unable to participate he would then be able to transition to Hospice services in the SNF.Pt sister agreeable.   CSW initiated SNF search to [facility].   CSW contacted pt sister, Vaughan Basta via telephone and left voice message.  CSW visited pt room and pt brother, Will present. CSW left list of SNF offers with pt brother to provide to pt sister.   CSW to follow up with pt sister regarding decision for SNF for anticipation of discharge tomorrow.  CSW to continue to follow.  Alison Murray, MSW, [place] Work [phone]

## 2015-08-10 NOTE — Care Management Note (Signed)
Case Management Note  Patient Details  Name: Hutton Pellicane MRN: [id number] Date of Birth: 06-Apr-1946  Subjective/Objective:        69 yo admitted with Postobstructive PNA            Action/Plan: Pt lives in hotel with roommate.  Expected Discharge Date:   (unknown)               Expected Discharge Plan:  Skilled Nursing Facility  In-House Referral:  Clinical Social Work  Discharge planning Services  CM Consult  Post Acute Care Choice:    Choice offered to:     DME Arranged:    DME Agency:     HH Arranged:    [place] Agency:     Status of Service:  In process, will continue to follow  Medicare Important Message Given:    Date Medicare IM Given:    Medicare IM give by:    Date Additional Medicare IM Given:    Additional Medicare Important Message give by:     If discussed at [place] of Stay Meetings, dates discussed:    Additional Comments:  Lynnell Catalan, RN 08/10/2015, 2:16 PM

## 2015-08-10 NOTE — Progress Notes (Signed)
Pt has had 4 watery, mucousy stools on this shift and previous shift. Paged NP and received order to collect sample for c. Diff testing and place on enteric precautions. Also made NP aware that pt has a slightly low BP and tachy HR and doesn't have any IV fluids ordered, no new orders received. Pt has drank water as it is offered throughout the shift. Continue to monitor. Hortencia Conradi RN

## 2015-08-10 NOTE — NC FL2 (Signed)
[place] MEDICAID FL2 LEVEL OF CARE SCREENING TOOL     IDENTIFICATION  Patient Name: Brendan Hooper Birthdate: 03-06-1946 Sex: male Admission Date (Current Location): 2015-08-16  [facility] and [place] Number:  Herbalist and Address:  [facility],  [place] [address] [place]      Provider Number: [id number]  Attending Physician Name and Address:  Velvet Bathe, MD  Relative Name and Phone Number:       Current Level of Care: Hospital Recommended Level of Care: [place] Prior Approval Number:    Date Approved/Denied:   PASRR Number: [id number]  Discharge Plan: SNF    Current Diagnoses: Patient Active Problem List   Diagnosis Date Noted  . Post-obstructive pneumonia due to foreign body aspiration 08-16-2015  . 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 Pulmonary embolus ([place]) 06/14/2015  . 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 Hypotension 04/04/2015  . Thrush 04/04/2015  . Anorexia 04/04/2015  . Weight loss 04/04/2015  . Hypoalbuminemia 04/04/2015  . Rash 04/04/2015  . Hypotension arterial 03/20/2015  . Oral thrush 03/20/2015  . Dehydration 03/05/2015  . FTT (failure to thrive) in adult 03/05/2015  . Generalized weakness 03/05/2015  . Weakness   . Hypomagnesemia 02/08/2015  . Neck pain   . Brain metastases ([place]) 09/19/2014  . Bronchogenic cancer of right lung ([place]) 08/25/2014  . Hypokalemia   . Protein-calorie malnutrition, severe ([place]) 08/08/2014  . Enlargement of lymph node   . CAP (community acquired pneumonia) 08/07/2014  . Lung mass 08/07/2014  . Abscess of right groin 08/07/2014  . Postobstructive pneumonia 08/06/2014  . Syncope 10/11/2011  . Alcohol abuse 10/11/2011  . HTN (hypertension) 10/11/2011  . Current smoker 10/11/2011    Orientation RESPIRATION BLADDER Height & Weight    Self  Normal Incontinent, External catheter   132 lbs.  BEHAVIORAL SYMPTOMS/MOOD NEUROLOGICAL BOWEL NUTRITION STATUS     (NONE) Continent Diet (Dysphagia 3 (Mech soft);Thin liquid )  AMBULATORY STATUS COMMUNICATION OF NEEDS Skin   Extensive Assist Verbally Normal                       Personal Care Assistance Level of Assistance  Bathing, Feeding, Dressing Bathing Assistance: Maximum assistance Feeding assistance: Limited assistance Dressing Assistance: Maximum assistance     Functional Limitations Info  Sight, Hearing, Speech Sight Info: Adequate Hearing Info: Adequate Speech Info: Adequate    SPECIAL CARE FACTORS FREQUENCY  PT (By licensed PT), OT (By licensed OT), Speech therapy     PT Frequency: 5 x a week OT Frequency: 5 x a week     Speech Therapy Frequency: 2 x a week      Contractures Contractures Info: Not present    Additional Factors Info  Code Status, Allergies, Isolation Precautions Code Status Info: DNR code status Allergies Info: No Known Allergies     Isolation Precautions Info: Enteric precautions (UV disinfection)-r/o CDIFF     Current Medications (08/10/2015):  This is the current hospital active medication list Current Facility-Administered Medications  Medication Dose Route Frequency Provider Last Rate Last Dose  . albuterol (PROVENTIL) (2.5 MG/3ML) 0.083% nebulizer solution 2.5 mg  2.5 mg Nebulization Q4H PRN Thurnell Lose, MD      . alum & mag hydroxide-simeth (MAALOX/MYLANTA) 200-200-20 MG/5ML suspension 30 mL  30 mL Oral Q6H PRN Thurnell Lose, MD      . Ampicillin-Sulbactam (UNASYN) 3 g in sodium chloride 0.9 % 100 mL IVPB  3 g  Intravenous Q6H Leann T Poindexter, RPH   3 g at 08/10/15 0502  . azithromycin (ZITHROMAX) tablet 500 mg  500 mg Oral q1800 Velvet Bathe, MD      . Derrill Memo ON 08/11/2015] cholecalciferol (VITAMIN D) tablet 1,000 Units  1,000 Units Oral Weekly Thurnell Lose, MD      . feeding supplement (ENSURE ENLIVE) (ENSURE ENLIVE) liquid 237 mL  237 mL Oral Once per day on Mon Thu Thurnell Lose, MD   237 mL at 08/10/15 0946  . feeding  supplement (PRO-STAT SUGAR FREE 64) liquid 30 mL  30 mL Oral TID WC Thurnell Lose, MD   30 mL at 08/10/15 0946  . folic acid (FOLVITE) tablet 1 mg  1 mg Oral Daily Thurnell Lose, MD   1 mg at 08/10/15 0947  . hydrocerin (EUCERIN) cream   Topical BID Thurnell Lose, MD      . lidocaine-prilocaine (EMLA) cream 1 application  1 application Topical PRN Thurnell Lose, MD      . nystatin cream (MYCOSTATIN)   Topical TID Thurnell Lose, MD      . ondansetron [facility]) injection 4 mg  4 mg Intravenous Q6H PRN Thurnell Lose, MD      . potassium chloride SA (K-DUR,KLOR-CON) CR tablet 20 mEq  20 mEq Oral Daily Thurnell Lose, MD   20 mEq at 08/10/15 0946  . rivaroxaban (XARELTO) tablet 20 mg  20 mg Oral Q supper Nilda Simmer, RPH   20 mg at 08/09/15 1828  . [START ON 08/11/2015] thiamine (VITAMIN B-1) tablet 100 mg  100 mg Oral Weekly Thurnell Lose, MD       Facility-Administered Medications Ordered in Other Encounters  Medication Dose Route Frequency Provider Last Rate Last Dose  . sodium chloride 0.9 % injection 10 mL  10 mL Intravenous PRN Curt Bears, MD   10 mL at 02/16/15 1756     Discharge Medications: Please see discharge summary for a list of discharge medications.  Relevant Imaging Results:  Relevant Lab Results:   Additional Information SSN: [id number]. Pt has had 2 cycles of palliative chemotherapy and treatment is currently on hold for now until possible improvement of his condition and continued discussions with family.Pt to have palliative care services follow at SNF.   Shawnmichael Parenteau, [place], LCSW

## 2015-08-10 NOTE — Progress Notes (Signed)
[facility] PROGRESS NOTE  Brendan Hooper ZOX:[id number] DOB: 01/25/1946 DOA: August 22, 2015 PCP: [name], MD  Assessment/Plan: 1. Postobstructive right-sided pneumonia.  -We'll plan on continuing IV antibiotics   - Long-term prognosis extremely poor. - no leukocytosis on last check - transition to oral antibiotic regimen once patient defervesces   2. 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 Discussed his case with Dr. [name], he has failed chemotherapy and immunotherapy as dictated in history of present illness, currently on palliative chemotherapy, long-term prognosis poor, I discussed this with Dr. [name] and with patient, he is DO NOT RESUSCITATE - Palliative care on board and assisting with planning     3. Recent diagnosis of PE. On xaralto continue pharmacy to dose, latest CT scan shows resolution of PE, likely minimum 6 months of treatment as he is hypercoagulable from malignancy.   4. History of bullous pemphigoid/Toxic epidermal Necrolysis - abortive care with wound care, apply nystatin cream to open blisters.   5. Severe dehydration, severe protein calorie malnutrition and hypokalemia. Replace potassium, 2 L IV fluid bolus along with maintenance IV fluids. Consulted dietitian. Placed on pro-stat.   6. Generalized weakness.  - continue PT.  7. Hypokalemia - replaced IV and orally - reassess this evening - reassess next am. - obtain magnesium levels  Code StatusDO NOT RESUSCITATE  Family Communication: none at bedside Disposition Plan:  barriers to discharge: Discharge planning given disposition and hypokalemia   Consultants:  Palliative care  Procedures:   none   Antibiotics:  Unasyn  Azithromycin  HPI/Subjective: Pt has no new complaints. No acute issues overnight.  Objective: Filed Vitals:   08/10/15 0612 08/10/15 1318  BP: 126/74 101/77  Pulse: 117 124  Temp: 97.6 F (36.4 C)  100.4 F (38 C)  Resp: 16 17    Intake/Output Summary (Last 24 hours) at 08/10/15 1548 Last data filed at 08/10/15 1529  Gross per 24 hour  Intake    300 ml  Output   1051 ml  Net   -751 ml   Filed Weights   08-22-15 1710 08/10/15 0700  Weight: 60.2 kg (132 lb 11.5 oz) 60.201 kg (132 lb 11.5 oz)    Exam:   General:  Pt in nad, alert and awake  Cardiovascular: rrr, no rubs  Respiratory: Equal chest rise, no wheezes  Abdomen: soft, no guarding, nondistended   Musculoskeletal: no cyanosis on limited exam, equal tone  Data Reviewed: Basic Metabolic Panel:  Recent Labs Lab 08/22/15 1247 08/09/15 1400 08/10/15 0350  NA 146* 149* 148*  K 2.8* 2.8* 2.5*  CL 108 111 109  CO2 '25 27 27  '$ GLUCOSE 114* 114* 115*  BUN 11 <5* <5*  CREATININE 0.72 0.55* 0.55*  CALCIUM 10.1 9.4 9.4   Liver Function Tests:  Recent Labs Lab 08-22-2015 1247  AST 25  ALT 12*  ALKPHOS 59  BILITOT 1.0  PROT 6.5  ALBUMIN 3.4*   No results for input(s): LIPASE, AMYLASE in the last 168 hours. No results for input(s): AMMONIA in the last 168 hours. CBC:  Recent Labs Lab 08/22/2015 1247  WBC 9.2  NEUTROABS 5.9  HGB 14.4  HCT 40.9  MCV 80.2  PLT 259   Cardiac Enzymes: No results for input(s): CKTOTAL, CKMB, CKMBINDEX, TROPONINI in the last 168 hours. BNP (last 3 results) No results for input(s): BNP in the last 8760 hours.  ProBNP (last 3 results) No results for input(s): PROBNP in the last 8760  hours.  CBG: No results for input(s): GLUCAP in the last 168 hours.  Recent Results (from the past 240 hour(s))  Blood culture (routine x 2)     Status: None (Preliminary result)   Collection Time: 2015/08/12 12:45 PM  Result Value Ref Range Status   Specimen Description BLOOD LEFT FOREARM  Final   Special Requests IN PEDIATRIC BOTTLE 4ML  Final   Culture   Final    NO GROWTH 2 DAYS Performed at [facility]    Report Status PENDING  Incomplete  Blood culture (routine x 2)      Status: None (Preliminary result)   Collection Time: 08/12/2015  7:14 PM  Result Value Ref Range Status   Specimen Description BLOOD LEFT ARM  Final   Special Requests BOTTLES DRAWN AEROBIC AND ANAEROBIC 8CC  Final   Culture   Final    NO GROWTH 2 DAYS Performed at [facility]    Report Status PENDING  Incomplete  MRSA PCR Screening     Status: None   Collection Time: 2015-08-12  9:21 PM  Result Value Ref Range Status   MRSA by PCR NEGATIVE NEGATIVE Final    Comment:        The GeneXpert MRSA Assay (FDA approved for NASAL specimens only), is one component of a comprehensive MRSA colonization surveillance program. It is not intended to diagnose MRSA infection nor to guide or monitor treatment for MRSA infections.      Studies: No results found.  Scheduled Meds: . ampicillin-sulbactam (UNASYN) IV  3 g Intravenous Q6H  . azithromycin  500 mg Oral q1800  . [START ON 08/11/2015] cholecalciferol  1,000 Units Oral Weekly  . feeding supplement (ENSURE ENLIVE)  237 mL Oral Once per day on Mon Thu  . feeding supplement (PRO-STAT SUGAR FREE 64)  30 mL Oral TID WC  . folic acid  1 mg Oral Daily  . hydrocerin   Topical BID  . nystatin cream   Topical TID  . potassium chloride SA  20 mEq Oral Daily  . rivaroxaban  20 mg Oral Q supper  . [START ON 08/11/2015] thiamine  100 mg Oral Weekly   Continuous Infusions:   Time spent: > 35 minutes  Velvet Bathe  [facility] Pager [phone] If 7PM-7AM, please contact night-coverage at [email or web address], password [facility] 08/10/2015, 3:48 PM  LOS: 2 days

## 2015-08-10 NOTE — NC FL2 (Deleted)
[place] MEDICAID FL2 LEVEL OF CARE SCREENING TOOL     IDENTIFICATION  Patient Name: Brendan Hooper Birthdate: 02-Aug-1946 Sex: male Admission Date (Current Location): 08/11/15  [facility] and [place] Number:  Herbalist and Address:  [facility],  [place] [address], [place]      Provider Number: [id number]  Attending Physician Name and Address:  Velvet Bathe, MD  Relative Name and Phone Number:       Current Level of Care: Hospital Recommended Level of Care: [place] Prior Approval Number:    Date Approved/Denied:   PASRR Number: [id number]  Discharge Plan: SNF    Current Diagnoses: Patient Active Problem List   Diagnosis Date Noted  . Post-obstructive pneumonia due to foreign body aspiration 08/11/15  . 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 Pulmonary embolus ([place]) 06/14/2015  . 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 Hypotension 04/04/2015  . Thrush 04/04/2015  . Anorexia 04/04/2015  . Weight loss 04/04/2015  . Hypoalbuminemia 04/04/2015  . Rash 04/04/2015  . Hypotension arterial 03/20/2015  . Oral thrush 03/20/2015  . Dehydration 03/05/2015  . FTT (failure to thrive) in adult 03/05/2015  . Generalized weakness 03/05/2015  . Weakness   . Hypomagnesemia 02/08/2015  . Neck pain   . Brain metastases ([place]) 09/19/2014  . Bronchogenic cancer of right lung ([place]) 08/25/2014  . Hypokalemia   . Protein-calorie malnutrition, severe ([place]) 08/08/2014  . Enlargement of lymph node   . CAP (community acquired pneumonia) 08/07/2014  . Lung mass 08/07/2014  . Abscess of right groin 08/07/2014  . Postobstructive pneumonia 08/06/2014  . Syncope 10/11/2011  . Alcohol abuse 10/11/2011  . HTN (hypertension) 10/11/2011  . Current smoker 10/11/2011    Orientation RESPIRATION BLADDER Height & Weight    Self  Normal Incontinent, External catheter   132 lbs.  BEHAVIORAL SYMPTOMS/MOOD NEUROLOGICAL BOWEL NUTRITION STATUS     (NONE) Continent Diet (Dysphagia 3 (Mech soft);Thin liquid )  AMBULATORY STATUS COMMUNICATION OF NEEDS Skin   Extensive Assist Verbally Normal                       Personal Care Assistance Level of Assistance  Bathing, Feeding, Dressing Bathing Assistance: Maximum assistance Feeding assistance: Limited assistance Dressing Assistance: Maximum assistance     Functional Limitations Info  Sight, Hearing, Speech Sight Info: Adequate Hearing Info: Adequate Speech Info: Adequate    SPECIAL CARE FACTORS FREQUENCY  PT (By licensed PT), OT (By licensed OT), Speech therapy     PT Frequency: 5 x a week OT Frequency: 5 x a week     Speech Therapy Frequency: 2 x a week      Contractures Contractures Info: Not present    Additional Factors Info  Code Status, Allergies Code Status Info: DNR code status Allergies Info: No Known Allergies           Current Medications (08/10/2015):  This is the current hospital active medication list Current Facility-Administered Medications  Medication Dose Route Frequency Provider Last Rate Last Dose  . albuterol (PROVENTIL) (2.5 MG/3ML) 0.083% nebulizer solution 2.5 mg  2.5 mg Nebulization Q4H PRN Thurnell Lose, MD      . alum & mag hydroxide-simeth (MAALOX/MYLANTA) 200-200-20 MG/5ML suspension 30 mL  30 mL Oral Q6H PRN Thurnell Lose, MD      . Ampicillin-Sulbactam (UNASYN) 3 g in sodium chloride 0.9 % 100 mL IVPB  3 g Intravenous Q6H Nilda Simmer, RPH  3 g at 08/10/15 0502  . azithromycin (ZITHROMAX) tablet 500 mg  500 mg Oral q1800 Velvet Bathe, MD      . Derrill Memo ON 08/11/2015] cholecalciferol (VITAMIN D) tablet 1,000 Units  1,000 Units Oral Weekly Thurnell Lose, MD      . feeding supplement (ENSURE ENLIVE) (ENSURE ENLIVE) liquid 237 mL  237 mL Oral Once per day on Mon Thu Thurnell Lose, MD   237 mL at 08/10/15 0946  . feeding supplement (PRO-STAT SUGAR FREE 64) liquid 30 mL  30 mL Oral TID WC Thurnell Lose, MD   30 mL  at 08/10/15 0946  . folic acid (FOLVITE) tablet 1 mg  1 mg Oral Daily Thurnell Lose, MD   1 mg at 08/10/15 0947  . hydrocerin (EUCERIN) cream   Topical BID Thurnell Lose, MD      . lidocaine-prilocaine (EMLA) cream 1 application  1 application Topical PRN Thurnell Lose, MD      . nystatin cream (MYCOSTATIN)   Topical TID Thurnell Lose, MD      . ondansetron [facility]) injection 4 mg  4 mg Intravenous Q6H PRN Thurnell Lose, MD      . potassium chloride SA (K-DUR,KLOR-CON) CR tablet 20 mEq  20 mEq Oral Daily Thurnell Lose, MD   20 mEq at 08/10/15 0946  . rivaroxaban (XARELTO) tablet 20 mg  20 mg Oral Q supper Nilda Simmer, RPH   20 mg at 08/09/15 1828  . [START ON 08/11/2015] thiamine (VITAMIN B-1) tablet 100 mg  100 mg Oral Weekly Thurnell Lose, MD       Facility-Administered Medications Ordered in Other Encounters  Medication Dose Route Frequency Provider Last Rate Last Dose  . sodium chloride 0.9 % injection 10 mL  10 mL Intravenous PRN Curt Bears, MD   10 mL at 02/16/15 1756     Discharge Medications: Please see discharge summary for a list of discharge medications.  Relevant Imaging Results:  Relevant Lab Results:   Additional Information SSN: [id number]. Pt to have palliative care services follow at SNF.   Nylia Gavina, [place], LCSW

## 2015-08-10 NOTE — Progress Notes (Signed)
Daily Progress Note   Patient Name: Brendan Hooper       Date: 08/10/2015 DOB: 17-Jun-1946  Age: 69 y.o. MRN#: [id number] Attending Physician: Velvet Bathe, MD Primary Care Physician: [name], MD Admit Date: 08-31-15  Reason for Consultation/Follow-up: Establishing goals of care and Psychosocial/spiritual support  Subjective: Brendan Hooper is resting quietly in bed today.  He makes eye contact with me and tells me that he has to urinate ( has a condom cath).  He is unable to tell me where he is.  He continues with poor appetite.  I reassure him that he is safe here, as he is telling me that he is being held here "like a slave".    Call to sister Vaughan Basta.  We discuss his labs with normal WBC and urine testing.  We also discuss the radiology report that states 'significant progression of disease".  Vaughan Basta tells me that she has been viewing 'my chart' and has seen the report.   We discuss his continued confusion.  We talk about palliative chemotherapy and the concept of stopping when they feel this is no longer helpful.    We discuss the concept of going to SNF for rehab if he is able to participate.  I share that if he is unable to participate he would then be able to transition to Hospice services in the SNF.  Vaughan Basta shares that she thinks this is a good plan for Brendan Hooper.  She tells me that they have tried to get him to do this in the past but he declined.  She shares that she is tearful but accepting.    Length of Stay: 2 days  Current Medications: Scheduled Meds:  . ampicillin-sulbactam (UNASYN) IV  3 g Intravenous Q6H  . azithromycin  500 mg Oral q1800  . [START ON 08/11/2015] cholecalciferol  1,000 Units Oral Weekly  . feeding supplement (ENSURE ENLIVE)  237 mL Oral Once per day on Mon Thu  . feeding supplement (PRO-STAT SUGAR FREE 64)  30 mL Oral TID WC  . folic acid  1 mg Oral Daily  . hydrocerin   Topical BID  . nystatin cream   Topical TID  . potassium chloride SA  20 mEq Oral  Daily  . rivaroxaban  20 mg Oral Q supper  . [START ON 08/11/2015] thiamine  100 mg Oral Weekly    Continuous Infusions:    PRN Meds: albuterol, alum & mag hydroxide-simeth, lidocaine-prilocaine, ondansetron (ZOFRAN) IV  Palliative Performance Scale: 30%     Vital Signs: BP 126/74 mmHg  Pulse 117  Temp(Src) 97.6 F (36.4 C) (Oral)  Resp 16  Ht 6' (1.829 m)  Wt 60.201 kg (132 lb 11.5 oz)  BMI 18.00 kg/m2  SpO2 95% SpO2: SpO2: 95 % O2 Device: O2 Device: Not Delivered O2 Flow Rate:    Intake/output summary:  Intake/Output Summary (Last 24 hours) at 08/10/15 1032 Last data filed at 08/10/15 1020  Gross per 24 hour  Intake    370 ml  Output    650 ml  Net   -280 ml   LBM:   Baseline Weight: Weight: 60.2 kg (132 lb 11.5 oz) Most recent weight: Weight: 60.201 kg (132 lb 11.5 oz)  Physical Exam: Constitutional: frail, confused,  Makes eye contact.  Resp: even and non labored GI: abd soft, mild tenderness  Additional Data Reviewed: Recent Labs     08-31-2015  1247  08/09/15  1400  08/10/15  0350  WBC  9.2   --    --  HGB  14.4   --    --   PLT  259   --    --   NA  146*  149*  148*  BUN  11  <5*  <5*  CREATININE  0.72  0.55*  0.55*     Problem List:  Patient Active Problem List   Diagnosis Date Noted  . Post-obstructive pneumonia due to foreign body aspiration 2015/08/13  . 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 Pulmonary embolus ([place]) 06/14/2015  . 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 Hypotension 04/04/2015  . Thrush 04/04/2015  . Anorexia 04/04/2015  . Weight loss 04/04/2015  . Hypoalbuminemia 04/04/2015  . Rash 04/04/2015  . Hypotension arterial 03/20/2015  . Oral thrush 03/20/2015  . Dehydration 03/05/2015  . FTT (failure to thrive) in adult 03/05/2015  . Generalized weakness 03/05/2015  . Weakness   . Hypomagnesemia 02/08/2015  . Neck pain   . Brain metastases ([place]) 09/19/2014  . Bronchogenic cancer of right lung ([place]) 08/25/2014  .  Hypokalemia   . Protein-calorie malnutrition, severe ([place]) 08/08/2014  . Enlargement of lymph node   . CAP (community acquired pneumonia) 08/07/2014  . Lung mass 08/07/2014  . Abscess of right groin 08/07/2014  . Postobstructive pneumonia 08/06/2014  . Syncope 10/11/2011  . Alcohol abuse 10/11/2011  . HTN (hypertension) 10/11/2011  . Current smoker 10/11/2011     Palliative Care Assessment & Plan    Code Status:  DNR  Goals of Care:  SNF for rehab if possible  If unable to rehab at SNF, transition to comfort care with palliative/hospice services in SNF.   Symptom Management:  Zofran 4 mg IV Q 6 hours PRN  Palliative Prophylaxis:  Loose stools at this time.   Psycho-social/Spiritual:  Desire for further Chaplaincy support:  Not discussed today.    Prognosis: < 3 months, likely  Discharge Planning: [place] for rehab with Palliative care service follow-up, Hospice when appropriate.    Care plan was discussed with nursing staff, SW Kidd, and Dr. Wendee Beavers.   Thank you for allowing the Palliative Medicine Team to assist in the care of this patient.   Time In:  1005 Time Out:  1040 Total Time  35 minutes  Prolonged Time Billed  no     Greater than 50%  of this time was spent counseling and coordinating care related to the above assessment and plan.   Drue Novel, NP  08/10/2015, 10:32 AM  Please contact Palliative Medicine Team phone at [phone] for questions and concerns.

## 2015-08-11 LAB — CBC
HEMATOCRIT: 37.6 % — AB (ref 39.0–52.0)
Hemoglobin: 13.1 g/dL (ref 13.0–17.0)
MCH: 27.8 pg (ref 26.0–34.0)
MCHC: 34.8 g/dL (ref 30.0–36.0)
MCV: 79.7 fL (ref 78.0–100.0)
Platelets: 258 10*3/uL (ref 150–400)
RBC: 4.72 MIL/uL (ref 4.22–5.81)
RDW: 17.2 % — AB (ref 11.5–15.5)
WBC: 14.1 10*3/uL — ABNORMAL HIGH (ref 4.0–10.5)

## 2015-08-11 LAB — BASIC METABOLIC PANEL
Anion gap: 11 (ref 5–15)
CO2: 26 mmol/L (ref 22–32)
Calcium: 9.2 mg/dL (ref 8.9–10.3)
Chloride: 112 mmol/L — ABNORMAL HIGH (ref 101–111)
Creatinine, Ser: 0.54 mg/dL — ABNORMAL LOW (ref 0.61–1.24)
GFR calc Af Amer: >60 mL/min (ref >60)
GLUCOSE: 112 mg/dL — AB (ref 65–99)
POTASSIUM: 3 mmol/L — AB (ref 3.5–5.1)
Sodium: 149 mmol/L — ABNORMAL HIGH (ref 135–145)

## 2015-08-11 LAB — MAGNESIUM: Magnesium: 1.2 mg/dL — ABNORMAL LOW (ref 1.7–2.4)

## 2015-08-11 MED ORDER — MAGNESIUM SULFATE 2 GM/50ML IV SOLN
2.0000 g | Freq: Once | INTRAVENOUS | Status: AC
  Administered 2015-08-11: 2 g via INTRAVENOUS
  Filled 2015-08-11: qty 50

## 2015-08-11 MED ORDER — POTASSIUM CHLORIDE 10 MEQ/100ML IV SOLN
10.0000 meq | INTRAVENOUS | Status: AC
  Administered 2015-08-11 (×2): 10 meq via INTRAVENOUS
  Filled 2015-08-11 (×2): qty 100

## 2015-08-11 NOTE — Progress Notes (Addendum)
[facility] PROGRESS NOTE  Trafton Roker YTR:[id number] DOB: 06/17/1946 DOA: September 07, 2015 PCP: [name], MD  Assessment/Plan: 1. Postobstructive right-sided pneumonia.  - Pt is currently on Ampicillin and WBC trended up. Will continue ampicillin and reassess wbc levels next am - Long-term prognosis extremely poor. - transition to oral antibiotic regimen once WBC near normal values and resolution of fevers   2. 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 Discussed this case with Dr. [name], he has failed chemotherapy and immunotherapy as dictated in history of present illness, currently on palliative chemotherapy, long-term prognosis poor, I discussed this with Dr. [name] and with patient,  is DO NOT RESUSCITATE - Palliative care on board plan is to d/c to snf   3. Recent diagnosis of PE. On xaralto continue pharmacy to dose, latest CT scan shows resolution of PE, likely minimum 6 months of treatment as he is hypercoagulable from malignancy.   4. History of bullous pemphigoid/Toxic epidermal Necrolysis - abortive care with wound care, apply nystatin cream to open blisters.   5. Severe dehydration, severe protein calorie malnutrition and hypokalemia. Replace potassium, 2 L IV fluid bolus along with maintenance IV fluids. Consulted dietitian. Placed on pro-stat.   6. Generalized weakness.  - continue PT.  7. Hypokalemia - replaced orally - reassess this evening - reassess next am.  Addendum: 8. Hypomagnesemia - replace and reassess  Code StatusDO NOT RESUSCITATE  Family Communication: none at bedside Disposition Plan:  barriers to discharge: Hypomagnesemia, leukocytosis,  and hypokalemia   Consultants:  Palliative care  Procedures:   none   Antibiotics:  Unasyn  HPI/Subjective: Pt has no new complaints. No acute issues overnight.  Objective: Filed Vitals:   08/10/15 2035 08/11/15 0441  BP: 104/72  90/61  Pulse: 122 123  Temp: 98.1 F (36.7 C) 98 F (36.7 C)  Resp: 18 16    Intake/Output Summary (Last 24 hours) at 08/11/15 1450 Last data filed at 08/11/15 0818  Gross per 24 hour  Intake    270 ml  Output   1003 ml  Net   -733 ml   Filed Weights   09-07-15 1710 08/10/15 0700  Weight: 60.2 kg (132 lb 11.5 oz) 60.201 kg (132 lb 11.5 oz)    Exam:   General:  Pt in nad, alert and awake  Cardiovascular: rrr, no rubs  Respiratory: Equal chest rise, no wheezes  Abdomen: soft, no guarding, nondistended   Musculoskeletal: no cyanosis on limited exam, equal tone  Data Reviewed: Basic Metabolic Panel:  Recent Labs Lab 09-07-2015 1247 08/09/15 1400 08/10/15 0350 08/10/15 1645 08/11/15 08/11/15 0004  NA 146* 149* 148*  --  149*  --   K 2.8* 2.8* 2.5* 3.2* 3.0*  --   CL 108 111 109  --  112*  --   CO2 '25 27 27  '$ --  26  --   GLUCOSE 114* 114* 115*  --  112*  --   BUN 11 <5* <5*  --  <5*  --   CREATININE 0.72 0.55* 0.55*  --  0.54*  --   CALCIUM 10.1 9.4 9.4  --  9.2  --   MG  --   --   --  1.1*  --  1.2*   Liver Function Tests:  Recent Labs Lab Sep 07, 2015 1247  AST 25  ALT 12*  ALKPHOS 59  BILITOT 1.0  PROT 6.5  ALBUMIN 3.4*   No results for input(s): LIPASE, AMYLASE  in the last 168 hours. No results for input(s): AMMONIA in the last 168 hours. CBC:  Recent Labs Lab 07/25/2015 1247 08/11/15  WBC 9.2 14.1*  NEUTROABS 5.9  --   HGB 14.4 13.1  HCT 40.9 37.6*  MCV 80.2 79.7  PLT 259 258   Cardiac Enzymes: No results for input(s): CKTOTAL, CKMB, CKMBINDEX, TROPONINI in the last 168 hours. BNP (last 3 results) No results for input(s): BNP in the last 8760 hours.  ProBNP (last 3 results) No results for input(s): PROBNP in the last 8760 hours.  CBG: No results for input(s): GLUCAP in the last 168 hours.  Recent Results (from the past 240 hour(s))  Blood culture (routine x 2)     Status: None (Preliminary result)   Collection Time: 07/28/2015 12:45 PM   Result Value Ref Range Status   Specimen Description BLOOD LEFT FOREARM  Final   Special Requests IN PEDIATRIC BOTTLE 4ML  Final   Culture   Final    NO GROWTH 3 DAYS Performed at [facility]    Report Status PENDING  Incomplete  Blood culture (routine x 2)     Status: None (Preliminary result)   Collection Time: 08/13/2015  7:14 PM  Result Value Ref Range Status   Specimen Description BLOOD LEFT ARM  Final   Special Requests BOTTLES DRAWN AEROBIC AND ANAEROBIC [place]  Final   Culture   Final    NO GROWTH 3 DAYS Performed at [facility]    Report Status PENDING  Incomplete  MRSA PCR Screening     Status: None   Collection Time: Aug 18, 2015  9:21 PM  Result Value Ref Range Status   MRSA by PCR NEGATIVE NEGATIVE Final    Comment:        The GeneXpert MRSA Assay (FDA approved for NASAL specimens only), is one component of a comprehensive MRSA colonization surveillance program. It is not intended to diagnose MRSA infection nor to guide or monitor treatment for MRSA infections.   C difficile quick scan w PCR reflex     Status: None   Collection Time: 08/10/15  4:26 PM  Result Value Ref Range Status   C Diff antigen NEGATIVE NEGATIVE Final   C Diff toxin NEGATIVE NEGATIVE Final   C Diff interpretation Negative for toxigenic C. difficile  Final     Studies: No results found.  Scheduled Meds: . ampicillin-sulbactam (UNASYN) IV  3 g Intravenous Q6H  . cholecalciferol  1,000 Units Oral Weekly  . feeding supplement (ENSURE ENLIVE)  237 mL Oral Once per day on Mon Thu  . feeding supplement (PRO-STAT SUGAR FREE 64)  30 mL Oral TID WC  . folic acid  1 mg Oral Daily  . hydrocerin   Topical BID  . nystatin cream   Topical TID  . potassium chloride SA  20 mEq Oral Daily  . rivaroxaban  20 mg Oral Q supper  . thiamine  100 mg Oral Weekly   Continuous Infusions:   Time spent: > 35 minutes  Velvet Bathe  [facility] Pager [phone] If 7PM-7AM, please contact  night-coverage at [email or web address], password [facility] 08/11/2015, 2:50 PM  LOS: 3 days

## 2015-08-11 NOTE — Progress Notes (Signed)
CSW continuing to follow for disposition planning.  CSW followed up with pt sister, Vaughan Basta at bedside.   Pt sister confirmed that she received SNF bed offers. Pt sister chooses [facility]   CSW notified [facility] of acceptance of bed offers.  CSW to continue to follow.   Alison Murray, MSW, [place] [facility] [phone]

## 2015-08-11 NOTE — Progress Notes (Signed)
CSW continuing to follow for disposition planning.   Pt plan is to discharge to [facility] when medically ready.  Per MD, pt not yet medically ready for discharge today.  CSW confirmed with [facility] that facility could accept pt over the weekend if needed.  CSW to continue to follow and facilitate pt discharge needs when pt medically ready for discharge.  Alison Murray, MSW, [place] [facility] [phone]

## 2015-08-11 NOTE — Clinical Social Work Placement (Signed)
   CLINICAL SOCIAL WORK PLACEMENT  NOTE  Date:  08/11/2015  Patient Details  Name: Johntavius Shepard MRN: [id number] Date of Birth: 1945/11/16  Clinical Social Work is seeking post-discharge placement for this patient at the [place] level of care (*CSW will initial, date and re-position this form in  chart as items are completed):  Yes   Patient/family provided with [place] Work Department's list of facilities offering this level of care within the geographic area requested by the patient (or if unable, by the patient's family).  Yes   Patient/family informed of their freedom to choose among providers that offer the needed level of care, that participate in Medicare, Medicaid or managed care program needed by the patient, have an available bed and are willing to accept the patient.  Yes   Patient/family informed of [facility]'s ownership interest in [facility] and [facility], as well as of the fact that they are under no obligation to receive care at these facilities.  PASRR submitted to EDS on 08/10/15     PASRR number received on 08/10/15     Existing PASRR number confirmed on       FL2 transmitted to all facilities in geographic area requested by pt/family on 08/10/15     FL2 transmitted to all facilities within larger geographic area on       Patient informed that his/her managed care company has contracts with or will negotiate with certain facilities, including the following:        Yes   Patient/family informed of bed offers received.  Patient chooses bed at [facility]     Physician recommends and patient chooses bed at      Patient to be transferred to [facility] on  .  Patient to be transferred to facility by       Patient family notified on   of transfer.  Name of family member notified:        PHYSICIAN Please sign FL2, Please sign DNR     Additional Comment:     _______________________________________________ Ladell Pier, LCSW 08/11/2015, 8:32 AM

## 2015-08-11 NOTE — Progress Notes (Signed)
ANTIBIOTIC CONSULT NOTE - Follow Up  Pharmacy Consult for Unasyn Indication: Pneumonia  No Known Allergies  Patient Measurements: Height: 6' (182.9 cm) Weight: 132 lb 11.5 oz (60.201 kg) IBW/kg (Calculated) : 77.6  Vital Signs: Temp: 98 F (36.7 C) (12/23 0441) Temp Source: Oral (12/23 0441) BP: 90/61 mmHg (12/23 0441) Pulse Rate: 123 (12/23 0441) Intake/Output from previous day: 12/22 0701 - 12/23 0700 In: 60 [P.O.:60] Out: 1202 [Urine:1200; Stool:2] Intake/Output from this shift: Total I/O In: -  Out: 2 [Stool:2]  Labs:  Recent Labs  2015/08/22 1247 08/09/15 1400 08/10/15 0350 08/11/15  WBC 9.2  --   --  14.1*  HGB 14.4  --   --  13.1  PLT 259  --   --  258  CREATININE 0.72 0.55* 0.55* 0.54*   Estimated Creatinine Clearance: 73.2 mL/min (by C-G formula based on Cr of 0.54). No results for input(s): VANCOTROUGH, VANCOPEAK, VANCORANDOM, GENTTROUGH, GENTPEAK, GENTRANDOM, TOBRATROUGH, TOBRAPEAK, TOBRARND, AMIKACINPEAK, AMIKACINTROU, AMIKACIN in the last 72 hours.   Microbiology: Recent Results (from the past 720 hour(s))  Blood culture (routine x 2)     Status: None (Preliminary result)   Collection Time: 2015/08/22 12:45 PM  Result Value Ref Range Status   Specimen Description BLOOD LEFT FOREARM  Final   Special Requests IN PEDIATRIC BOTTLE 4ML  Final   Culture   Final    NO GROWTH 2 DAYS Performed at [facility]    Report Status PENDING  Incomplete  Blood culture (routine x 2)     Status: None (Preliminary result)   Collection Time: 2015/08/22  7:14 PM  Result Value Ref Range Status   Specimen Description BLOOD LEFT ARM  Final   Special Requests BOTTLES DRAWN AEROBIC AND ANAEROBIC 8CC  Final   Culture   Final    NO GROWTH 2 DAYS Performed at [facility]    Report Status PENDING  Incomplete  MRSA PCR Screening     Status: None   Collection Time: 2015-08-22  9:21 PM  Result Value Ref Range Status   MRSA by PCR NEGATIVE NEGATIVE Final   Comment:        The GeneXpert MRSA Assay (FDA approved for NASAL specimens only), is one component of a comprehensive MRSA colonization surveillance program. It is not intended to diagnose MRSA infection nor to guide or monitor treatment for MRSA infections.   C difficile quick scan w PCR reflex     Status: None   Collection Time: 08/10/15  4:26 PM  Result Value Ref Range Status   C Diff antigen NEGATIVE NEGATIVE Final   C Diff toxin NEGATIVE NEGATIVE Final   C Diff interpretation Negative for toxigenic C. difficile  Final    Medical History: Past Medical History  Diagnosis Date  . GERD (gastroesophageal reflux disease)   . Headache(784.0)   . Sickle cell anemia (HCC)   . Lung mass   . Hypertension   . Bronchogenic cancer of right lung ([place]) 08/25/2014  . Brain cancer ([place])     non small cell lung ca with mets to brain    Medications:  Scheduled:  . ampicillin-sulbactam (UNASYN) IV  3 g Intravenous Q6H  . azithromycin  500 mg Oral q1800  . cholecalciferol  1,000 Units Oral Weekly  . feeding supplement (ENSURE ENLIVE)  237 mL Oral Once per day on Mon Thu  . feeding supplement (PRO-STAT SUGAR FREE 64)  30 mL Oral TID WC  . folic acid  1 mg  Oral Daily  . hydrocerin   Topical BID  . nystatin cream   Topical TID  . potassium chloride SA  20 mEq Oral Daily  . rivaroxaban  20 mg Oral Q supper  . thiamine  100 mg Oral Weekly   Infusions:   Assessment: 69 yr male with PMH including lung cancer with mets to the brain and currently on palliative chemotherapy, being admitted for pneumonia. Pharmacy consulted to dose Unasyn for treatment of aspiration pneumonia.  MD also dosing Azithromycin.  12/20 >>Unasyn >> 12/20 >>Azithromycin >>    12/20 blood: ngtd 12/22 Cdiff: neg  Goal of Therapy:  Eradication of infection  Plan:  Day 4 Antibiotics 1) Continue Unasyn 3g IV q6 but with patient tolerating other oral medication, recommend changing to Augmentin '875mg'$  BID to complete  a total of 7 days for treatment of aspiration PNA. 2) Recommend d/c Zithromax   Adrian Saran, PharmD, BCPS Pager [phone] 08/11/2015 10:01 AM

## 2015-08-11 NOTE — Progress Notes (Signed)
Physical Therapy Treatment Patient Details Name: Brendan Hooper MRN: [id number] DOB: April 02, 1946 Today's Date: 08/11/2015    History of Present Illness Mr.  Hooper is a 69 y.o. male, non-small cell adenocarcinoma of the right lung with metastases to the brain i.Hooper. 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  Per Brendan Hooper, possible d/c to SNF today.  Follow Up Recommendations  SNF;Supervision/Assistance - 24 hour     Equipment Recommendations  None recommended by Brendan    Recommendations for Other Services       Precautions / Restrictions Precautions Precautions: Fall Precaution Hooper: skin lesions/blisters abound    Mobility  Bed Mobility Overal bed mobility: Needs Assistance Bed Mobility: Supine to Sit;Sit to Supine     Supine to sit: Total assist;+2 for physical assistance Sit to supine: Mod assist   General bed mobility Hooper: Brendan attempting to move LEs to EOB, utilized bed pad to sit Brendan upright with straight back (has mets to spine), Brendan self initiating return to supine due to fatigue, assist for lifting LEs onto bed  Transfers Overall transfer level: Needs assistance Equipment used: Rolling walker (2 wheeled) Transfers: Sit to/from Stand Sit to Stand: +2 physical assistance;Max assist;From elevated surface         General transfer comment: verbal cues for technique, Brendan hold onto RW, assist to stand, steady and control descent, able to hold standing approx 45  secs, performed twice for changing bed linen and pericare (from NT)  Ambulation/Gait                 Stairs            Wheelchair Mobility    Modified Rankin (Stroke Patients Only)       Balance                                    Cognition Arousal/Alertness: Awake/alert Behavior During Therapy: Flat affect Overall Cognitive Status: No family/caregiver present to determine baseline cognitive functioning                 General Hooper: following commands better today however fatigues easily    Exercises      General Hooper        Pertinent Vitals/Pain Pain Assessment: No/denies pain    Home Living                      Prior Function            Brendan Goals (current goals can now be found in the care plan section) Progress towards Brendan goals: Progressing toward goals    Frequency  Min 2X/week    Brendan Plan Current plan remains appropriate    Co-evaluation             End of Session   Activity Tolerance: No increased pain;Patient limited by fatigue Patient left: in bed;with bed alarm set;with call bell/phone within reach  Time: 1540-0867 Brendan Time Calculation (min) (ACUTE ONLY): 13 min  Charges:  $Therapeutic Activity: 8-22 mins                    G Codes:      Brendan Hooper,Brendan Hooper 08-23-2015, 3:07 PM Brendan Hooper, Brendan, DPT 08/23/15 Pager: [phone]

## 2015-08-12 ENCOUNTER — Ambulatory Visit: Payer: Medicare Other

## 2015-08-12 LAB — CBC
HCT: 38.2 % — ABNORMAL LOW (ref 39.0–52.0)
HEMOGLOBIN: 13.1 g/dL (ref 13.0–17.0)
MCH: 27.5 pg (ref 26.0–34.0)
MCHC: 34.3 g/dL (ref 30.0–36.0)
MCV: 80.1 fL (ref 78.0–100.0)
Platelets: 320 10*3/uL (ref 150–400)
RBC: 4.77 MIL/uL (ref 4.22–5.81)
RDW: 17 % — AB (ref 11.5–15.5)
WBC: 17.6 10*3/uL — AB (ref 4.0–10.5)

## 2015-08-12 LAB — BASIC METABOLIC PANEL
ANION GAP: 13 (ref 5–15)
BUN: 5 mg/dL — AB (ref 6–20)
CALCIUM: 9 mg/dL (ref 8.9–10.3)
CO2: 24 mmol/L (ref 22–32)
Chloride: 112 mmol/L — ABNORMAL HIGH (ref 101–111)
Creatinine, Ser: 0.59 mg/dL — ABNORMAL LOW (ref 0.61–1.24)
GFR calc Af Amer: >60 mL/min (ref >60)
GLUCOSE: 113 mg/dL — AB (ref 65–99)
POTASSIUM: 3 mmol/L — AB (ref 3.5–5.1)
SODIUM: 149 mmol/L — AB (ref 135–145)

## 2015-08-12 LAB — MAGNESIUM: MAGNESIUM: 1.5 mg/dL — AB (ref 1.7–2.4)

## 2015-08-12 MED ORDER — POTASSIUM CHLORIDE 10 MEQ/100ML IV SOLN
10.0000 meq | INTRAVENOUS | Status: AC
  Administered 2015-08-12 (×2): 10 meq via INTRAVENOUS
  Filled 2015-08-12 (×2): qty 100

## 2015-08-12 MED ORDER — POTASSIUM CHLORIDE CRYS ER 20 MEQ PO TBCR
40.0000 meq | EXTENDED_RELEASE_TABLET | Freq: Two times a day (BID) | ORAL | Status: DC
  Administered 2015-08-12 – 2015-08-13 (×3): 40 meq via ORAL
  Filled 2015-08-12 (×4): qty 2

## 2015-08-12 MED ORDER — MAGNESIUM SULFATE IN D5W 10-5 MG/ML-% IV SOLN
1.0000 g | Freq: Once | INTRAVENOUS | Status: AC
  Administered 2015-08-12: 1 g via INTRAVENOUS
  Filled 2015-08-12: qty 100

## 2015-08-12 MED ORDER — LEVOFLOXACIN 750 MG PO TABS
750.0000 mg | ORAL_TABLET | Freq: Every day | ORAL | Status: DC
  Administered 2015-08-12: 750 mg via ORAL
  Filled 2015-08-12: qty 1

## 2015-08-12 NOTE — Progress Notes (Signed)
PHARMACY NOTE -  [facility] has been assisting with dosing of Levaquin for PNA. Dosage will be ordered and remain stable at '750mg'$  PO daily and need for further dosage adjustment appears unlikely at present.    Will sign off at this time.  Please reconsult if a change in clinical status warrants re-evaluation of dosage.  Adrian Saran, PharmD, BCPS Pager [phone] 08/12/2015 9:24 AM

## 2015-08-12 NOTE — Progress Notes (Signed)
[facility] PROGRESS NOTE  Brendan Hooper OZD:[id number] DOB: 1946/01/19 DOA: 02-Sep-2015 PCP: [name], MD  Assessment/Plan: 1. Postobstructive right-sided pneumonia.  - wbc trending up. Will change to levaquin - Long-term prognosis extremely poor. - transition to oral antibiotic regimen once WBC near normal values and resolution of fevers   2. 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 Discussed this case with Dr. [name], he has failed chemotherapy and immunotherapy as dictated in history of present illness, currently on palliative chemotherapy, long-term prognosis poor, I discussed this with Dr. [name] and with patient,  is DO NOT RESUSCITATE - Palliative care on board plan is to d/c to snf   3. Recent diagnosis of PE. On xaralto continue pharmacy to dose, latest CT scan shows resolution of PE, likely minimum 6 months of treatment as he is hypercoagulable from malignancy.   4. History of bullous pemphigoid/Toxic epidermal Necrolysis - abortive care with wound care, apply nystatin cream to open blisters.   5. Severe dehydration, severe protein calorie malnutrition and hypokalemia. Replace potassium, 2 L IV fluid bolus along with maintenance IV fluids. Consulted dietitian. Placed on pro-stat.   6. Generalized weakness.  - continue PT.  7. Hypokalemia - replaced orally - reassess this evening - reassess next am.  8. Hypomagnesemia - replace and reassess  Code StatusDO NOT RESUSCITATE  Family Communication: none at bedside Disposition Plan:  barriers to discharge: Hypomagnesemia, leukocytosis,  and hypokalemia   Consultants:  Palliative care  Procedures:   none   Antibiotics:  Unasyn  HPI/Subjective: Pt has no new complaints. No acute issues overnight.  Objective: Filed Vitals:   08/12/15 0548 08/12/15 1420  BP: 105/62 98/58  Pulse: 134 120  Temp: 98 F (36.7 C) 98 F (36.7 C)  Resp: 16 18     Intake/Output Summary (Last 24 hours) at 08/12/15 1703 Last data filed at 08/12/15 1100  Gross per 24 hour  Intake    120 ml  Output    703 ml  Net   -583 ml   Filed Weights   09/02/15 1710 08/10/15 0700  Weight: 60.2 kg (132 lb 11.5 oz) 60.201 kg (132 lb 11.5 oz)    Exam:   General:  Pt in nad, alert and awake  Cardiovascular: rrr, no rubs  Respiratory: Equal chest rise, no wheezes  Abdomen: soft, no guarding, nondistended   Musculoskeletal: no cyanosis on limited exam, equal tone  Data Reviewed: Basic Metabolic Panel:  Recent Labs Lab 09-02-15 1247 08/09/15 1400 08/10/15 0350 08/10/15 1645 08/11/15 08/11/15 0004 08/12/15 0420  NA 146* 149* 148*  --  149*  --  149*  K 2.8* 2.8* 2.5* 3.2* 3.0*  --  3.0*  CL 108 111 109  --  112*  --  112*  CO2 '25 27 27  '$ --  26  --  24  GLUCOSE 114* 114* 115*  --  112*  --  113*  BUN 11 <5* <5*  --  <5*  --  5*  CREATININE 0.72 0.55* 0.55*  --  0.54*  --  0.59*  CALCIUM 10.1 9.4 9.4  --  9.2  --  9.0  MG  --   --   --  1.1*  --  1.2* 1.5*   Liver Function Tests:  Recent Labs Lab 09/02/2015 1247  AST 25  ALT 12*  ALKPHOS 59  BILITOT 1.0  PROT 6.5  ALBUMIN 3.4*   No results for input(s): LIPASE, AMYLASE  in the last 168 hours. No results for input(s): AMMONIA in the last 168 hours. CBC:  Recent Labs Lab 27-Aug-2015 1247 08/11/15 08/12/15 0420  WBC 9.2 14.1* 17.6*  NEUTROABS 5.9  --   --   HGB 14.4 13.1 13.1  HCT 40.9 37.6* 38.2*  MCV 80.2 79.7 80.1  PLT 259 258 320   Cardiac Enzymes: No results for input(s): CKTOTAL, CKMB, CKMBINDEX, TROPONINI in the last 168 hours. BNP (last 3 results) No results for input(s): BNP in the last 8760 hours.  ProBNP (last 3 results) No results for input(s): PROBNP in the last 8760 hours.  CBG: No results for input(s): GLUCAP in the last 168 hours.  Recent Results (from the past 240 hour(s))  Blood culture (routine x 2)     Status: None (Preliminary result)   Collection  Time: Aug 27, 2015 12:45 PM  Result Value Ref Range Status   Specimen Description BLOOD LEFT FOREARM  Final   Special Requests IN PEDIATRIC BOTTLE 4ML  Final   Culture   Final    NO GROWTH 4 DAYS Performed at [facility]    Report Status PENDING  Incomplete  Blood culture (routine x 2)     Status: None (Preliminary result)   Collection Time: 08-27-15  7:14 PM  Result Value Ref Range Status   Specimen Description BLOOD LEFT ARM  Final   Special Requests BOTTLES DRAWN AEROBIC AND ANAEROBIC [place]  Final   Culture   Final    NO GROWTH 4 DAYS Performed at [facility]    Report Status PENDING  Incomplete  MRSA PCR Screening     Status: None   Collection Time: 08-27-2015  9:21 PM  Result Value Ref Range Status   MRSA by PCR NEGATIVE NEGATIVE Final    Comment:        The GeneXpert MRSA Assay (FDA approved for NASAL specimens only), is one component of a comprehensive MRSA colonization surveillance program. It is not intended to diagnose MRSA infection nor to guide or monitor treatment for MRSA infections.   C difficile quick scan w PCR reflex     Status: None   Collection Time: 08/10/15  4:26 PM  Result Value Ref Range Status   C Diff antigen NEGATIVE NEGATIVE Final   C Diff toxin NEGATIVE NEGATIVE Final   C Diff interpretation Negative for toxigenic C. difficile  Final     Studies: No results found.  Scheduled Meds: . cholecalciferol  1,000 Units Oral Weekly  . feeding supplement (ENSURE ENLIVE)  237 mL Oral Once per day on Mon Thu  . feeding supplement (PRO-STAT SUGAR FREE 64)  30 mL Oral TID WC  . folic acid  1 mg Oral Daily  . hydrocerin   Topical BID  . levofloxacin  750 mg Oral Daily  . nystatin cream   Topical TID  . potassium chloride  40 mEq Oral BID  . rivaroxaban  20 mg Oral Q supper  . thiamine  100 mg Oral Weekly   Continuous Infusions:   Time spent: > 35 minutes  Velvet Bathe  [facility] Pager [phone] If 7PM-7AM, please contact  night-coverage at [email or web address], password [facility] 08/12/2015, 5:03 PM  LOS: 4 days

## 2015-08-13 LAB — BASIC METABOLIC PANEL
ANION GAP: 11 (ref 5–15)
ANION GAP: 9 (ref 5–15)
BUN: 7 mg/dL (ref 6–20)
BUN: 9 mg/dL (ref 6–20)
CO2: 23 mmol/L (ref 22–32)
CO2: 25 mmol/L (ref 22–32)
Calcium: 9.1 mg/dL (ref 8.9–10.3)
Calcium: 8.7 mg/dL — ABNORMAL LOW (ref 8.9–10.3)
Chloride: 117 mmol/L — ABNORMAL HIGH (ref 101–111)
Chloride: 117 mmol/L — ABNORMAL HIGH (ref 101–111)
Creatinine, Ser: 0.73 mg/dL (ref 0.61–1.24)
Creatinine, Ser: 0.71 mg/dL (ref 0.61–1.24)
GFR calc Af Amer: >60 mL/min (ref >60)
GFR calc non Af Amer: >60 mL/min (ref >60)
GLUCOSE: 100 mg/dL — AB (ref 65–99)
Glucose, Bld: 182 mg/dL — ABNORMAL HIGH (ref 65–99)
POTASSIUM: 3.8 mmol/L (ref 3.5–5.1)
POTASSIUM: 3.3 mmol/L — AB (ref 3.5–5.1)
SODIUM: 151 mmol/L — AB (ref 135–145)
Sodium: 151 mmol/L — ABNORMAL HIGH (ref 135–145)

## 2015-08-13 LAB — CBC
HCT: 37.8 % — ABNORMAL LOW (ref 39.0–52.0)
Hemoglobin: 13.2 g/dL (ref 13.0–17.0)
MCH: 28.3 pg (ref 26.0–34.0)
MCHC: 34.9 g/dL (ref 30.0–36.0)
MCV: 80.9 fL (ref 78.0–100.0)
Platelets: 333 10*3/uL (ref 150–400)
RBC: 4.67 MIL/uL (ref 4.22–5.81)
RDW: 17.1 % — ABNORMAL HIGH (ref 11.5–15.5)
WBC: 24.1 10*3/uL — ABNORMAL HIGH (ref 4.0–10.5)

## 2015-08-13 LAB — CULTURE, BLOOD (ROUTINE X 2)
CULTURE: NO GROWTH
CULTURE: NO GROWTH

## 2015-08-13 LAB — LACTIC ACID, PLASMA
Lactic Acid, Venous: 1.3 mmol/L (ref 0.5–2.0)
Lactic Acid, Venous: 1.4 mmol/L (ref 0.5–2.0)

## 2015-08-13 LAB — MAGNESIUM: Magnesium: 1.5 mg/dL — ABNORMAL LOW (ref 1.7–2.4)

## 2015-08-13 LAB — MRSA PCR SCREENING: MRSA BY PCR: NEGATIVE

## 2015-08-13 MED ORDER — DEXTROSE 5 % IV SOLN
1.0000 g | Freq: Three times a day (TID) | INTRAVENOUS | Status: DC
  Administered 2015-08-13 – 2015-08-15 (×6): 1 g via INTRAVENOUS
  Filled 2015-08-13 (×8): qty 1

## 2015-08-13 MED ORDER — VANCOMYCIN HCL IN DEXTROSE 750-5 MG/150ML-% IV SOLN
750.0000 mg | Freq: Two times a day (BID) | INTRAVENOUS | Status: DC
  Administered 2015-08-13 – 2015-08-14 (×4): 750 mg via INTRAVENOUS
  Filled 2015-08-13 (×6): qty 150

## 2015-08-13 MED ORDER — DEXTROSE 5 % IV SOLN
INTRAVENOUS | Status: DC
  Administered 2015-08-13 – 2015-08-14 (×2): via INTRAVENOUS

## 2015-08-13 MED ORDER — SODIUM CHLORIDE 0.9 % IV BOLUS (SEPSIS): 250.0000 mL | Freq: Once | INTRAVENOUS | Status: DC

## 2015-08-13 MED ORDER — SODIUM CHLORIDE 0.9 % IV BOLUS (SEPSIS)
500.0000 mL | Freq: Once | INTRAVENOUS | Status: AC
Start: 2015-08-13 — End: 2015-08-13
  Administered 2015-08-13: 500 mL via INTRAVENOUS

## 2015-08-13 MED ORDER — SODIUM CHLORIDE 0.9 % IJ SOLN: 10.0000 mL | INTRAMUSCULAR | Status: DC | PRN

## 2015-08-13 MED ORDER — SODIUM CHLORIDE 0.9 % IV BOLUS (SEPSIS)
1000.0000 mL | Freq: Once | INTRAVENOUS | Status: AC
  Administered 2015-08-13: 1000 mL via INTRAVENOUS

## 2015-08-13 MED ORDER — MAGNESIUM SULFATE IN D5W 10-5 MG/ML-% IV SOLN
1.0000 g | Freq: Once | INTRAVENOUS | Status: AC
  Administered 2015-08-13: 1 g via INTRAVENOUS
  Filled 2015-08-13: qty 100

## 2015-08-13 MED ORDER — SODIUM CHLORIDE 0.9 % IV BOLUS (SEPSIS)
500.0000 mL | Freq: Once | INTRAVENOUS | Status: AC
  Administered 2015-08-13: 500 mL via INTRAVENOUS

## 2015-08-13 MED ORDER — AZITHROMYCIN 500 MG IV SOLR
500.0000 mg | INTRAVENOUS | Status: DC
  Administered 2015-08-13 – 2015-08-14 (×2): 500 mg via INTRAVENOUS
  Filled 2015-08-13 (×2): qty 500

## 2015-08-13 NOTE — Progress Notes (Signed)
ANTIBIOTIC CONSULT NOTE - INITIAL  Pharmacy Consult for Vancomycin  Indication: pneumonia  No Known Allergies  Patient Measurements: Height: 6' (182.9 cm) Weight: 132 lb 11.5 oz (60.201 kg) IBW/kg (Calculated) : 77.6   Vital Signs: Temp: 98.6 F (37 C) (12/25 0510) Temp Source: Axillary (12/25 0510) BP: 81/59 mmHg (12/25 0510) Pulse Rate: 136 (12/25 0510) Intake/Output from previous day: 12/24 0701 - 12/25 0700 In: 240 [P.O.:240] Out: 500 [Urine:500] Intake/Output from this shift:    Labs:  Recent Labs  08/11/15 08/12/15 0420 08/13/15 0447  WBC 14.1* 17.6* 24.1*  HGB 13.1 13.1 13.2  PLT 258 320 333  CREATININE 0.54* 0.59* 0.73   Estimated Creatinine Clearance: 73.2 mL/min (by C-G formula based on Cr of 0.73). No results for input(s): VANCOTROUGH, VANCOPEAK, VANCORANDOM, GENTTROUGH, GENTPEAK, GENTRANDOM, TOBRATROUGH, TOBRAPEAK, TOBRARND, AMIKACINPEAK, AMIKACINTROU, AMIKACIN in the last 72 hours.   Microbiology: Recent Results (from the past 720 hour(s))  Blood culture (routine x 2)     Status: None (Preliminary result)   Collection Time: 2015/08/24 12:45 PM  Result Value Ref Range Status   Specimen Description BLOOD LEFT FOREARM  Final   Special Requests IN PEDIATRIC BOTTLE 4ML  Final   Culture   Final    NO GROWTH 4 DAYS Performed at [facility]    Report Status PENDING  Incomplete  Blood culture (routine x 2)     Status: None (Preliminary result)   Collection Time: 2015-08-24  7:14 PM  Result Value Ref Range Status   Specimen Description BLOOD LEFT ARM  Final   Special Requests BOTTLES DRAWN AEROBIC AND ANAEROBIC [place]  Final   Culture   Final    NO GROWTH 4 DAYS Performed at [facility]    Report Status PENDING  Incomplete  MRSA PCR Screening     Status: None   Collection Time: Aug 24, 2015  9:21 PM  Result Value Ref Range Status   MRSA by PCR NEGATIVE NEGATIVE Final    Comment:        The GeneXpert MRSA Assay (FDA approved for NASAL  specimens only), is one component of a comprehensive MRSA colonization surveillance program. It is not intended to diagnose MRSA infection nor to guide or monitor treatment for MRSA infections.   C difficile quick scan w PCR reflex     Status: None   Collection Time: 08/10/15  4:26 PM  Result Value Ref Range Status   C Diff antigen NEGATIVE NEGATIVE Final   C Diff toxin NEGATIVE NEGATIVE Final   C Diff interpretation Negative for toxigenic C. difficile  Final    Medical History: Past Medical History  Diagnosis Date  . GERD (gastroesophageal reflux disease)   . Headache(784.0)   . Sickle cell anemia (HCC)   . Lung mass   . Hypertension   . Bronchogenic cancer of right lung ([place]) 08/25/2014  . 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 cholecalciferol  1,000 Units Oral Weekly  . feeding supplement (ENSURE ENLIVE)  237 mL Oral Once per day on Mon Thu  . feeding supplement (PRO-STAT SUGAR FREE 64)  30 mL Oral TID WC  . folic acid  1 mg Oral Daily  . hydrocerin   Topical BID  . levofloxacin  750 mg Oral Daily  . nystatin cream   Topical TID  . potassium chloride  40 mEq Oral BID  . rivaroxaban  20 mg  Oral Q supper  . sodium chloride  250 mL Intravenous Once  . thiamine  100 mg Oral Weekly   Assessment: 69 yo male with PMH including lung cancer with mets to the brain and currently on palliative chemotherapy who was admitted for post-obstructive pneumonia.  He is clinically worsened today and antibiotic coverage is being broadened.   He remains afebrile with increasing WBC.  Renal function is stable.    2/20 >> azith >> 12/24, 12/25 >> 12/20 >> unasyn >> 12/24 12/24 >> Levaquin >> 12/25 12/25 >> vanc >> 12/25 >> cefepime >>   12/20 blood: ngtd 12/22 Cdiff: neg 12/20 MRSA: neg  Goal of Therapy:  Vancomycin trough level 15-20 mcg/ml  Eradicate infection.  Plan:  Cefepime 1gm IV q8h Vancomycin '750mg'$  IV  q12h Check Vancomycin trough at steady state Monitor renal function and cx data   Brendan Hooper 08/13/2015,8:42 AM

## 2015-08-13 NOTE — Progress Notes (Signed)
[facility] PROGRESS NOTE  Brendan Hooper VQQ:[id number] DOB: [birth year] DOA: 08-24-15 PCP: [name], MD  Assessment/Plan: 1. Postobstructive right-sided pneumonia.  - wbc trending up. Pt tachycardic with low blood pressures. Suspicious for sepsis. Will broaden antibiotic coverage - Will administer fluid bolus for soft blood pressures - medical list reviewed, no antihypertensives on board. - obtain lactic acid level  2. 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 Discussed this case with Dr. [name], he has failed chemotherapy and immunotherapy as dictated in history of present illness, currently on palliative chemotherapy, long-term prognosis poor, I discussed this with Dr. [name] and with patient,  is DO NOT RESUSCITATE - Palliative care on board plan is to d/c to snf with improvement in condition.   3. Recent diagnosis of PE. On xaralto continue pharmacy to dose, latest CT scan shows resolution of PE, likely minimum 6 months of treatment as he is hypercoagulable from malignancy.   4. History of bullous pemphigoid/Toxic epidermal Necrolysis - abortive care with wound care, apply nystatin cream to open blisters.   5. Severe dehydration, severe protein calorie malnutrition and hypokalemia. Replace potassium, 2 L IV fluid bolus along with maintenance IV fluids. Consulted dietitian. Placed on pro-stat.   6. Generalized weakness.  - continue PT.  7. Hypokalemia - replaced orally - resolved on last check  8. Hypomagnesemia - replace and reassess again  Code StatusDO NOT RESUSCITATE  Family Communication: none at bedside Disposition Plan:  Transition to stepdown for closer monitoring. Broadened antibiotic coverage   Consultants:  Palliative care  Procedures:   none   Antibiotics:  Cefepime  Azithromycin  Vancomycin  HPI/Subjective: Pt continues to have no new complaints.  Objective: Filed  Vitals:   08/12/15 2137 08/13/15 0510  BP: 100/67 81/59  Pulse: 114 136  Temp: 97.9 F (36.6 C) 98.6 F (37 C)  Resp: 16 16    Intake/Output Summary (Last 24 hours) at 08/13/15 0925 Last data filed at 08/12/15 1846  Gross per 24 hour  Intake    240 ml  Output    500 ml  Net   -260 ml   Filed Weights   08/24/2015 1710 08/10/15 0700  Weight: 60.2 kg (132 lb 11.5 oz) 60.201 kg (132 lb 11.5 oz)    Exam:   General:  Pt in nad, alert and awake  Cardiovascular: rrr, no rubs  Respiratory: Equal chest rise, no wheezes  Abdomen: soft, no guarding, nondistended   Musculoskeletal: no cyanosis on limited exam, equal tone  Data Reviewed: Basic Metabolic Panel:  Recent Labs Lab 08/09/15 1400 08/10/15 0350 08/10/15 1645 08/11/15 08/11/15 0004 08/12/15 0420 08/13/15 0447 08/13/15 0448  NA 149* 148*  --  149*  --  149* 151*  --   K 2.8* 2.5* 3.2* 3.0*  --  3.0* 3.8  --   CL 111 109  --  112*  --  112* 117*  --   CO2 27 27  --  26  --  24 23  --   GLUCOSE 114* 115*  --  112*  --  113* 100*  --   BUN <5* <5*  --  <5*  --  5* 7  --   CREATININE 0.55* 0.55*  --  0.54*  --  0.59* 0.73  --   CALCIUM 9.4 9.4  --  9.2  --  9.0 9.1  --   MG  --   --  1.1*  --  1.2*  1.5*  --  1.5*   Liver Function Tests:  Recent Labs Lab 2015-08-17 1247  AST 25  ALT 12*  ALKPHOS 59  BILITOT 1.0  PROT 6.5  ALBUMIN 3.4*   No results for input(s): LIPASE, AMYLASE in the last 168 hours. No results for input(s): AMMONIA in the last 168 hours. CBC:  Recent Labs Lab August 17, 2015 1247 08/11/15 08/12/15 0420 08/13/15 0447  WBC 9.2 14.1* 17.6* 24.1*  NEUTROABS 5.9  --   --   --   HGB 14.4 13.1 13.1 13.2  HCT 40.9 37.6* 38.2* 37.8*  MCV 80.2 79.7 80.1 80.9  PLT 259 258 320 333   Cardiac Enzymes: No results for input(s): CKTOTAL, CKMB, CKMBINDEX, TROPONINI in the last 168 hours. BNP (last 3 results) No results for input(s): BNP in the last 8760 hours.  ProBNP (last 3 results) No results for  input(s): PROBNP in the last 8760 hours.  CBG: No results for input(s): GLUCAP in the last 168 hours.  Recent Results (from the past 240 hour(s))  Blood culture (routine x 2)     Status: None (Preliminary result)   Collection Time: 08-17-2015 12:45 PM  Result Value Ref Range Status   Specimen Description BLOOD LEFT FOREARM  Final   Special Requests IN PEDIATRIC BOTTLE 4ML  Final   Culture   Final    NO GROWTH 4 DAYS Performed at [facility]    Report Status PENDING  Incomplete  Blood culture (routine x 2)     Status: None (Preliminary result)   Collection Time: 08-17-2015  7:14 PM  Result Value Ref Range Status   Specimen Description BLOOD LEFT ARM  Final   Special Requests BOTTLES DRAWN AEROBIC AND ANAEROBIC [place]  Final   Culture   Final    NO GROWTH 4 DAYS Performed at [facility]    Report Status PENDING  Incomplete  MRSA PCR Screening     Status: None   Collection Time: 08/17/15  9:21 PM  Result Value Ref Range Status   MRSA by PCR NEGATIVE NEGATIVE Final    Comment:        The GeneXpert MRSA Assay (FDA approved for NASAL specimens only), is one component of a comprehensive MRSA colonization surveillance program. It is not intended to diagnose MRSA infection nor to guide or monitor treatment for MRSA infections.   C difficile quick scan w PCR reflex     Status: None   Collection Time: 08/10/15  4:26 PM  Result Value Ref Range Status   C Diff antigen NEGATIVE NEGATIVE Final   C Diff toxin NEGATIVE NEGATIVE Final   C Diff interpretation Negative for toxigenic C. difficile  Final     Studies: No results found.  Scheduled Meds: . azithromycin  500 mg Intravenous Q24H  . ceFEPime (MAXIPIME) IV  1 g Intravenous Q8H  . cholecalciferol  1,000 Units Oral Weekly  . feeding supplement (ENSURE ENLIVE)  237 mL Oral Once per day on Mon Thu  . feeding supplement (PRO-STAT SUGAR FREE 64)  30 mL Oral TID WC  . folic acid  1 mg Oral Daily  . hydrocerin    Topical BID  . nystatin cream   Topical TID  . potassium chloride  40 mEq Oral BID  . rivaroxaban  20 mg Oral Q supper  . sodium chloride  250 mL Intravenous Once  . thiamine  100 mg Oral Weekly  . vancomycin  750 mg Intravenous Q12H   Continuous Infusions:  Time spent: > 35 minutes  Velvet Bathe  [facility] Pager [phone] If 7PM-7AM, please contact night-coverage at [email or web address], password [facility] 08/13/2015, 9:25 AM  LOS: 5 days

## 2015-08-14 ENCOUNTER — Inpatient Hospital Stay (HOSPITAL_COMMUNITY): Payer: Medicare Other

## 2015-08-14 LAB — CBC
HEMATOCRIT: 30.1 % — AB (ref 39.0–52.0)
HEMATOCRIT: 30.8 % — AB (ref 39.0–52.0)
HEMOGLOBIN: 10.5 g/dL — AB (ref 13.0–17.0)
HEMOGLOBIN: 10.5 g/dL — AB (ref 13.0–17.0)
MCH: 28.2 pg (ref 26.0–34.0)
MCH: 27.6 pg (ref 26.0–34.0)
MCHC: 34.9 g/dL (ref 30.0–36.0)
MCHC: 34.1 g/dL (ref 30.0–36.0)
MCV: 81.1 fL (ref 78.0–100.0)
MCV: 80.9 fL (ref 78.0–100.0)
Platelets: 312 10*3/uL (ref 150–400)
Platelets: 302 10*3/uL (ref 150–400)
RBC: 3.8 MIL/uL — ABNORMAL LOW (ref 4.22–5.81)
RBC: 3.72 MIL/uL — ABNORMAL LOW (ref 4.22–5.81)
RDW: 16.8 % — AB (ref 11.5–15.5)
RDW: 17 % — AB (ref 11.5–15.5)
WBC: 22.1 10*3/uL — ABNORMAL HIGH (ref 4.0–10.5)
WBC: 19.9 10*3/uL — ABNORMAL HIGH (ref 4.0–10.5)

## 2015-08-14 LAB — BASIC METABOLIC PANEL
ANION GAP: 7 (ref 5–15)
ANION GAP: 8 (ref 5–15)
BUN: 6 mg/dL (ref 6–20)
BUN: 8 mg/dL (ref 6–20)
CALCIUM: 8.7 mg/dL — AB (ref 8.9–10.3)
CHLORIDE: 116 mmol/L — AB (ref 101–111)
CO2: 23 mmol/L (ref 22–32)
CO2: 21 mmol/L — AB (ref 22–32)
Calcium: 8.6 mg/dL — ABNORMAL LOW (ref 8.9–10.3)
Chloride: 118 mmol/L — ABNORMAL HIGH (ref 101–111)
Creatinine, Ser: 0.62 mg/dL (ref 0.61–1.24)
Creatinine, Ser: 0.63 mg/dL (ref 0.61–1.24)
GFR calc Af Amer: >60 mL/min (ref >60)
GFR calc non Af Amer: >60 mL/min (ref >60)
GFR calc non Af Amer: >60 mL/min (ref >60)
GLUCOSE: 175 mg/dL — AB (ref 65–99)
GLUCOSE: 171 mg/dL — AB (ref 65–99)
POTASSIUM: 3.1 mmol/L — AB (ref 3.5–5.1)
POTASSIUM: 3.1 mmol/L — AB (ref 3.5–5.1)
Sodium: 148 mmol/L — ABNORMAL HIGH (ref 135–145)
Sodium: 145 mmol/L (ref 135–145)

## 2015-08-14 LAB — PHOSPHORUS: Phosphorus: 2.2 mg/dL — ABNORMAL LOW (ref 2.5–4.6)

## 2015-08-14 LAB — MAGNESIUM: Magnesium: 1.5 mg/dL — ABNORMAL LOW (ref 1.7–2.4)

## 2015-08-14 MED ORDER — POTASSIUM CL IN DEXTROSE 5% 20 MEQ/L IV SOLN
20.0000 meq | INTRAVENOUS | Status: DC
  Administered 2015-08-14 (×2): 20 meq via INTRAVENOUS
  Filled 2015-08-14 (×6): qty 1000

## 2015-08-14 MED ORDER — POTASSIUM CHLORIDE 10 MEQ/100ML IV SOLN
10.0000 meq | INTRAVENOUS | Status: AC
Start: 2015-08-14 — End: 2015-08-14
  Administered 2015-08-14 (×2): 10 meq via INTRAVENOUS
  Filled 2015-08-14 (×2): qty 100

## 2015-08-14 MED ORDER — SODIUM CHLORIDE 0.9 % IV BOLUS (SEPSIS)
500.0000 mL | Freq: Once | INTRAVENOUS | Status: AC
  Administered 2015-08-14: 500 mL via INTRAVENOUS

## 2015-08-14 MED ORDER — MAGNESIUM SULFATE 2 GM/50ML IV SOLN: 2.0000 g | Freq: Once | INTRAVENOUS | Status: DC

## 2015-08-14 MED ORDER — SODIUM CHLORIDE 0.9 % IV SOLN
INTRAVENOUS | Status: DC
  Administered 2015-08-14: 10 mL/h via INTRAVENOUS

## 2015-08-14 MED ORDER — CETYLPYRIDINIUM CHLORIDE 0.05 % MT LIQD
7.0000 mL | Freq: Two times a day (BID) | OROMUCOSAL | Status: DC
  Administered 2015-08-14: 7 mL via OROMUCOSAL

## 2015-08-14 MED ORDER — MAGNESIUM SULFATE IN D5W 10-5 MG/ML-% IV SOLN
1.0000 g | Freq: Once | INTRAVENOUS | Status: AC
  Administered 2015-08-14: 1 g via INTRAVENOUS
  Filled 2015-08-14: qty 100

## 2015-08-14 MED ORDER — CHLORHEXIDINE GLUCONATE 0.12 % MT SOLN
15.0000 mL | Freq: Two times a day (BID) | OROMUCOSAL | Status: DC
  Administered 2015-08-14 – 2015-08-16 (×3): 15 mL via OROMUCOSAL
  Filled 2015-08-14 (×3): qty 15

## 2015-08-14 NOTE — Progress Notes (Addendum)
[facility] PROGRESS NOTE  Brendan Hooper [id number] DOB: [birth year] DOA: 05-Sep-2015 PCP: [name], MD   Addendum: Given overall clinical condition I believe patient's prognosis to be poor. He has had trouble swallowing food. Has metastatic adenocarcinoma to the brain. Hospital course complicated by hypotension and recurrent or worsening symptoms associate to post obstructive pneumonia.  Discussed case with palliative medicine as I feel at this juncture patient would benefit most from comfort care measures in hospice setting.  Assessment/Plan: 1. Postobstructive right-sided pneumonia.  - wbc trending up. Pt tachycardic with low blood pressures. Suspicious for sepsis. Broadened antibiotic coverage - Will administer fluid bolus for soft blood pressures - medical list reviewed, no antihypertensives on board. - lactic acid level within normal limits  2. 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  - Pt is currently DO NOT RESUSCITATE - Palliative care on board plan is to d/c to snf with improvement in condition.   3. Recent diagnosis of PE. On xaralto continue pharmacy to dose, latest CT scan shows resolution of PE, likely minimum 6 months of treatment as he is hypercoagulable from malignancy.   4. History of bullous pemphigoid/Toxic epidermal Necrolysis - abortive care with wound care, apply nystatin cream to open blisters.   5. Severe dehydration, severe protein calorie malnutrition and hypokalemia. Replace potassium, 2 L IV fluid bolus along with maintenance IV fluids. Consulted dietitian. Placed on pro-stat.   6. Generalized weakness.  - continue PT.  7. Hypokalemia - replaced orally - resolved on last check  8. Hypomagnesemia - replace and reassess again  Code StatusDO NOT RESUSCITATE  Family Communication: none at bedside Disposition Plan:  Transition to stepdown for closer monitoring. Broadened antibiotic  coverage   Consultants:  Palliative care  Procedures:   none   Antibiotics:  Cefepime  Azithromycin  Vancomycin  HPI/Subjective: Pt continues to have no new complaints.  Objective: Filed Vitals:   08/14/15 0700 08/14/15 0852  BP: 92/45   Pulse: 128   Temp:  97.2 F (36.2 C)  Resp: 25     Intake/Output Summary (Last 24 hours) at 08/14/15 1005 Last data filed at 08/14/15 0735  Gross per 24 hour  Intake   5020 ml  Output   2150 ml  Net   2870 ml   Filed Weights   09/05/15 1710 08/10/15 0700  Weight: 60.2 kg (132 lb 11.5 oz) 60.201 kg (132 lb 11.5 oz)    Exam:   General:  Pt in nad, alert and awake  Cardiovascular: rrr, no rubs  Respiratory: Equal chest rise, no wheezes  Abdomen: soft, no guarding, nondistended   Musculoskeletal: no cyanosis on limited exam, equal tone  Data Reviewed: Basic Metabolic Panel:  Recent Labs Lab 08/10/15 1645  08/11/15 0004 08/12/15 0420 08/13/15 08/13/15 0447 08/13/15 0448 08/13/15 1848 08/14/15 0458  NA  --   < >  --  149* 148* 151*  --  151* 145  K 3.2*  < >  --  3.0* 3.1* 3.8  --  3.3* 3.1*  CL  --   < >  --  112* 118* 117*  --  117* 116*  CO2  --   < >  --  '24 23 23  '$ --  25 21*  GLUCOSE  --   < >  --  113* 175* 100*  --  182* 171*  BUN  --   < >  --  5* 8 7  --  9 6  CREATININE  --   < >  --  0.59* 0.62 0.73  --  0.71 0.63  CALCIUM  --   < >  --  9.0 8.6* 9.1  --  8.7* 8.7*  MG 1.1*  --  1.2* 1.5*  --   --  1.5*  --  1.5*  < > = values in this interval not displayed. Liver Function Tests:  Recent Labs Lab Sep 05, 2015 1247  AST 25  ALT 12*  ALKPHOS 59  BILITOT 1.0  PROT 6.5  ALBUMIN 3.4*   No results for input(s): LIPASE, AMYLASE in the last 168 hours. No results for input(s): AMMONIA in the last 168 hours. CBC:  Recent Labs Lab 05-Sep-2015 1247 08/11/15 08/12/15 0420 08/13/15 08/13/15 0447  WBC 9.2 14.1* 17.6* 22.1* 24.1*  NEUTROABS 5.9  --   --   --   --   HGB 14.4 13.1 13.1 10.5* 13.2  HCT  40.9 37.6* 38.2* 30.8* 37.8*  MCV 80.2 79.7 80.1 81.1 80.9  PLT 259 258 320 312 333   Cardiac Enzymes: No results for input(s): CKTOTAL, CKMB, CKMBINDEX, TROPONINI in the last 168 hours. BNP (last 3 results) No results for input(s): BNP in the last 8760 hours.  ProBNP (last 3 results) No results for input(s): PROBNP in the last 8760 hours.  CBG: No results for input(s): GLUCAP in the last 168 hours.  Recent Results (from the past 240 hour(s))  Blood culture (routine x 2)     Status: None   Collection Time: 09/05/15 12:45 PM  Result Value Ref Range Status   Specimen Description BLOOD LEFT FOREARM  Final   Special Requests IN PEDIATRIC BOTTLE 4ML  Final   Culture   Final    NO GROWTH 5 DAYS Performed at [facility]    Report Status 08/13/2015 FINAL  Final  Blood culture (routine x 2)     Status: None   Collection Time: 09/05/15  7:14 PM  Result Value Ref Range Status   Specimen Description BLOOD LEFT ARM  Final   Special Requests BOTTLES DRAWN AEROBIC AND ANAEROBIC [place]  Final   Culture   Final    NO GROWTH 5 DAYS Performed at [facility]    Report Status 08/13/2015 FINAL  Final  MRSA PCR Screening     Status: None   Collection Time: 2015-09-05  9:21 PM  Result Value Ref Range Status   MRSA by PCR NEGATIVE NEGATIVE Final    Comment:        The GeneXpert MRSA Assay (FDA approved for NASAL specimens only), is one component of a comprehensive MRSA colonization surveillance program. It is not intended to diagnose MRSA infection nor to guide or monitor treatment for MRSA infections.   C difficile quick scan w PCR reflex     Status: None   Collection Time: 08/10/15  4:26 PM  Result Value Ref Range Status   C Diff antigen NEGATIVE NEGATIVE Final   C Diff toxin NEGATIVE NEGATIVE Final   C Diff interpretation Negative for toxigenic C. difficile  Final  MRSA PCR Screening     Status: None   Collection Time: 08/13/15 10:15 AM  Result Value Ref Range Status    MRSA by PCR NEGATIVE NEGATIVE Final    Comment:        The GeneXpert MRSA Assay (FDA approved for NASAL specimens only), is one component of a comprehensive MRSA colonization surveillance program. It is not intended to diagnose  MRSA infection nor to guide or monitor treatment for MRSA infections.      Studies: No results found.  Scheduled Meds: . antiseptic oral rinse  7 mL Mouth Rinse q12n4p  . azithromycin  500 mg Intravenous Q24H  . ceFEPime (MAXIPIME) IV  1 g Intravenous Q8H  . chlorhexidine  15 mL Mouth Rinse BID  . cholecalciferol  1,000 Units Oral Weekly  . feeding supplement (ENSURE ENLIVE)  237 mL Oral Once per day on Mon Thu  . feeding supplement (PRO-STAT SUGAR FREE 64)  30 mL Oral TID WC  . folic acid  1 mg Oral Daily  . hydrocerin   Topical BID  . magnesium sulfate 1 - 4 g bolus IVPB  1 g Intravenous Once  . nystatin cream   Topical TID  . potassium chloride  40 mEq Oral BID  . rivaroxaban  20 mg Oral Q supper  . sodium chloride  250 mL Intravenous Once  . thiamine  100 mg Oral Weekly  . vancomycin  750 mg Intravenous Q12H   Continuous Infusions: . sodium chloride 10 mL/hr (08/14/15 0737)  . dextrose 5 % with KCl 20 mEq / L      Time spent: > 35 minutes  Velvet Bathe  [facility] Pager [phone] If 7PM-7AM, please contact night-coverage at [email or web address], password [facility] 08/14/2015, 10:05 AM  LOS: 6 days

## 2015-08-14 NOTE — Progress Notes (Signed)
Gave patient a few sips of ensure with 30 ml prostat  Mixed in to help thicken.  Patient had difficulty swallowing.  Gave him small sips from a dose cup because he was unable to suck on a straw.  Patient continued to clear his throat and cough intermittently, weak cough. O2 sats remained 92-93% on room air.  Patient is alert but disoriented times four.

## 2015-08-15 DIAGNOSIS — R0600 Dyspnea, unspecified: Secondary | ICD-10-CM

## 2015-08-15 DIAGNOSIS — Z66 Do not resuscitate: Secondary | ICD-10-CM | POA: Insufficient documentation

## 2015-08-15 LAB — BASIC METABOLIC PANEL
ANION GAP: 7 (ref 5–15)
BUN: 5 mg/dL — ABNORMAL LOW (ref 6–20)
CALCIUM: 8.8 mg/dL — AB (ref 8.9–10.3)
CHLORIDE: 108 mmol/L (ref 101–111)
CO2: 22 mmol/L (ref 22–32)
CREATININE: 0.54 mg/dL — AB (ref 0.61–1.24)
GFR calc Af Amer: >60 mL/min (ref >60)
GFR calc non Af Amer: >60 mL/min (ref >60)
Glucose, Bld: 143 mg/dL — ABNORMAL HIGH (ref 65–99)
Potassium: 3.3 mmol/L — ABNORMAL LOW (ref 3.5–5.1)
Sodium: 137 mmol/L (ref 135–145)

## 2015-08-15 LAB — MAGNESIUM: MAGNESIUM: 1.7 mg/dL (ref 1.7–2.4)

## 2015-08-15 MED ORDER — MORPHINE SULFATE (PF) 2 MG/ML IV SOLN
1.0000 mg | INTRAVENOUS | Status: DC | PRN
Start: 2015-08-15 — End: 2015-08-16

## 2015-08-15 MED ORDER — LORAZEPAM 2 MG/ML IJ SOLN: 1.0000 mg | INTRAMUSCULAR | Status: DC | PRN

## 2015-08-15 NOTE — Progress Notes (Signed)
Nutrition Follow-up  DOCUMENTATION CODES:   Severe malnutrition in context of chronic illness, Underweight  INTERVENTION:  - Continue Prostat and Ensure Enlive as ordered - RD will continue to monitor for needs  NUTRITION DIAGNOSIS:   Malnutrition related to chronic illness as evidenced by percent weight loss, severe depletion of body fat, severe depletion of muscle mass. -ongoing  GOAL:   Patient will meet greater than or equal to 90% of their needs -unmet  MONITOR:   PO intake, Supplement acceptance, Labs, Weight trends, Skin, I & O's, Other (Comment) (GOC)  ASSESSMENT:   69 y.o. 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   12/27 Per chart review, pt consumed 25% of lunch and dinner 12/24 with no other intakes documented sine that time. Pt sleeping at this time with no family/visitors present. Per MD note this AM, pt to transition to the floor and to be comfort care in house.   Not meeting needs and no additional nutrition interventions needed at this time. Please alert RD should family have any concerns or needs. Medications reviewed. Labs reviewed; K: 3.3 mmol/L, BUN: 5, creatinine low, Ca: 8.8 mg/dL, Phos: 2.2 mg/dL.    12/22 - Pt in room with no family at bedside.  - Pt confused and unable to provide history. - Pt with mitts on his hands for safety.  - Pt currently on dysphagia 3 diet, eating  variably 5-100%.  - Pt has been ordered Ensure and Prostat TID.  - Pt with some weight gain since August, overall weight loss of 18 lb since May (12% weight loss x 7 months, significant for time frame). - Pt severely malnourished in fat and muscle regions.  - Palliative following for GOC to be decided within 24-48 hours.  Diet Order:  DIET DYS 3 Room service appropriate?: Yes; Fluid consistency:: Thin  Skin:  Reviewed, no issues  Last BM:  12/26  Height:   Ht Readings from Last 1 Encounters:  08/10/15 6' (1.829 m)    Weight:   Wt Readings from Last 1 Encounters:  08/10/15 132 lb 11.5 oz (60.201 kg)    Ideal Body Weight:  80.9 kg  BMI:  Body mass index is 18 kg/(m^2).  Estimated Nutritional Needs:   Kcal:  1800-2000  Protein:  90-100g  Fluid:  1.9L/day  EDUCATION NEEDS:   No education needs identified at this time     Jarome Matin, RD, LDN Inpatient Clinical Dietitian Pager # [phone] After hours/weekend pager # [phone]

## 2015-08-15 NOTE — Progress Notes (Signed)
Date: August 15, 2015 Chart reviewed for concurrent status and case management needs. Will continue to follow patient for changes and needs: Velva Harman, RN, BSN, [place]   [phone]

## 2015-08-15 NOTE — Progress Notes (Signed)
CSW assisting with d/c planning. Palliative Care Team has [facility]. CSW has contacted pt's sister Vaughan Basta MYTRZN [phone] ) to assist with d/c planning. Pt's sister has requested [facility] for placement. Erling Conte, liaison, from [facility] has been contacted and will review request.  CSW will continue to follow to assist with d/c planning needs.  Werner Lean LCSW [phone]

## 2015-08-15 NOTE — Progress Notes (Signed)
Physical Therapy Discharge Patient Details Name: Jorma Tassinari MRN: [id number] DOB: Apr 06, 1946 Today's Date: 08/15/2015 Time:  -     Patient discharged from PT services secondary to medical decline - will need to re-order PT to resume therapy services. MD notes reviewed, noted poor prognosis.  Please see latest therapy progress note for current level of functioning and progress toward goals.      GP     Marcelino Freestone PT [phone]  08/15/2015, 7:56 AM

## 2015-08-15 NOTE — Progress Notes (Signed)
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 Patient was doing well on antibiotics until his medical condition worsened after trying to narrow antibiotic coverage. I discussed case with sister and palliative care team. I believe patient has poor prognosis given his recurrent hypotension, altered mental status, and worsening shortness of breath. Recommendations from my end were made for comfort care and palliative currently discussing case with sister in order to set up hospice for continued comfort care measures moving forward.  Assessment/Plan: 1. Postobstructive right-sided pneumonia.  - Currently plan is for comfort care measures. Antibiotics have been discontinued by Palliative after discussion with sister at bedside.  2. 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  - Pt is currently DO NOT RESUSCITATE - Palliative care on board and plan is for comfort care measures in house. Transfer to floor   3. Recent diagnosis of PE.  - Please see above   4. History of bullous pemphigoid/Toxic epidermal Necrolysis - abortive care with wound care, apply nystatin cream to open blisters.   5. Severe dehydration, severe protein calorie malnutrition  - Placed on pro-stat.  6. Generalized weakness.  - PT was assisting.  7. Hypokalemia - replaced orally - resolved on last check  8. Hypomagnesemia - replace and reassess again  Code StatusDO NOT RESUSCITATE  Family Communication: none at bedside Disposition Plan:  Transition to stepdown for closer monitoring. Broadened antibiotic coverage   Consultants:  Palliative care  Procedures:   none    Antibiotics:  Discontinued broad spectrum abx's 08/15/15  HPI/Subjective: Pt pleasantly confused  Objective: Filed Vitals:   08/15/15 0745 08/15/15 0800  BP: 92/49   Pulse: 116 118  Temp:  98.4 F (36.9 C)  Resp: 21 27    Intake/Output Summary (Last 24 hours) at 08/15/15 1010 Last data filed at 08/15/15 0851  Gross per 24 hour  Intake 2583.33 ml  Output   2650 ml  Net -66.67 ml   Filed Weights   03-Sep-2015 1710 08/10/15 0700  Weight: 60.2 kg (132 lb 11.5 oz) 60.201 kg (132 lb 11.5 oz)    Exam:   General:  Pt in nad, alert and awake  Cardiovascular: rrr, no rubs  Respiratory: Equal chest rise, no wheezes  Abdomen: soft, no guarding, nondistended   Musculoskeletal: no cyanosis on limited exam, equal tone  Data Reviewed: Basic Metabolic Panel:  Recent Labs Lab 08/11/15 0004 08/12/15 0420 08/13/15 08/13/15 0447 08/13/15 0448 08/13/15 1848 08/14/15 0458 08/14/15 1000 08/15/15 0610  NA  --  149* 148* 151*  --  151* 145  --  137  K  --  3.0* 3.1* 3.8  --  3.3* 3.1*  --  3.3*  CL  --  112* 118* 117*  --  117* 116*  --  108  CO2  --  '24 23 23  '$ --  25 21*  --  22  GLUCOSE  --  113* 175* 100*  --  182* 171*  --  143*  BUN  --  5* 8 7  --  9 6  --  5*  CREATININE  --  0.59* 0.62 0.73  --  0.71 0.63  --  0.54*  CALCIUM  --  9.0 8.6* 9.1  --  8.7* 8.7*  --  8.8*  MG 1.2* 1.5*  --   --  1.5*  --  1.5*  --  1.7  PHOS  --   --   --   --   --   --   --  2.2*  --    Liver Function Tests:  Recent Labs Lab 18-Aug-2015 1247  AST 25  ALT 12*  ALKPHOS 59  BILITOT 1.0  PROT 6.5  ALBUMIN 3.4*   No results for input(s): LIPASE, AMYLASE in the last 168 hours. No results for input(s): AMMONIA in the last 168 hours. CBC:  Recent Labs Lab 07/20/2015 1247 08/11/15 08/12/15 0420 08/13/15 08/13/15 0447 08/14/15 1000  WBC 9.2 14.1* 17.6* 22.1* 24.1* 19.9*  NEUTROABS 5.9  --   --   --   --   --   HGB 14.4 13.1 13.1 10.5* 13.2 10.5*  HCT 40.9 37.6* 38.2* 30.8* 37.8*  30.1*  MCV 80.2 79.7 80.1 81.1 80.9 80.9  PLT 259 258 320 312 333 302   Cardiac Enzymes: No results for input(s): CKTOTAL, CKMB, CKMBINDEX, TROPONINI in the last 168 hours. BNP (last 3 results) No results for input(s): BNP in the last 8760 hours.  ProBNP (last 3 results) No results for input(s): PROBNP in the last 8760 hours.  CBG: No results for input(s): GLUCAP in the last 168 hours.  Recent Results (from the past 240 hour(s))  Blood culture (routine x 2)     Status: None   Collection Time: 08/03/2015 12:45 PM  Result Value Ref Range Status   Specimen Description BLOOD LEFT FOREARM  Final   Special Requests IN PEDIATRIC BOTTLE 4ML  Final   Culture   Final    NO GROWTH 5 DAYS Performed at [facility]    Report Status 08/13/2015 FINAL  Final  Blood culture (routine x 2)     Status: None   Collection Time: 07/27/2015  7:14 PM  Result Value Ref Range Status   Specimen Description BLOOD LEFT ARM  Final   Special Requests BOTTLES DRAWN AEROBIC AND ANAEROBIC [place]  Final   Culture   Final    NO GROWTH 5 DAYS Performed at [facility]    Report Status 08/13/2015 FINAL  Final  MRSA PCR Screening     Status: None   Collection Time: 07/20/2015  9:21 PM  Result Value Ref Range Status   MRSA by PCR NEGATIVE NEGATIVE Final    Comment:        The GeneXpert MRSA Assay (FDA approved for NASAL specimens only), is one component of a comprehensive MRSA colonization surveillance program. It is not intended to diagnose MRSA infection nor to guide or monitor treatment for MRSA infections.   C difficile quick scan w PCR reflex     Status: None   Collection Time: 08/10/15  4:26 PM  Result Value Ref Range Status   C Diff antigen NEGATIVE NEGATIVE Final   C Diff toxin NEGATIVE NEGATIVE Final   C Diff interpretation Negative for toxigenic C. difficile  Final  MRSA PCR Screening     Status: None   Collection Time: 08/13/15 10:15 AM  Result Value Ref Range Status   MRSA by PCR  NEGATIVE NEGATIVE Final    Comment:        The GeneXpert MRSA Assay (FDA approved for NASAL specimens only), is one component of a comprehensive MRSA colonization surveillance program. It is not intended to diagnose MRSA infection nor to guide  or monitor treatment for MRSA infections.      Studies: Dg Chest Port 1 View  08/14/2015  CLINICAL DATA:  Current smoker with history of hypertension. Tachypnea and confusion. Lung cancer. EXAM: PORTABLE CHEST 1 VIEW COMPARISON:  Radiographs and CT 08/30/2015. FINDINGS: 1004 hours. Left subclavian Port-A-Cath tip is unchanged at the SVC right atrial level. The heart size and mediastinal contours are stable. There are lower lung volumes with new airspace disease at the left lung base. The dominant right perihilar lung mass centered in the middle lobe on CT is again noted. Generalized interstitial prominence has developed, suspicious for superimposed edema. There is no significant pleural effusion. The bones appear unchanged. IMPRESSION: New generalized interstitial prominence throughout both lungs, most consistent with edema superimposed on underlying right lung cancer. There is left basilar airspace disease which may reflect atelectasis or pneumonia. Electronically Signed   By: Richardean Sale M.D.   On: 08/14/2015 10:25    Scheduled Meds: . antiseptic oral rinse  7 mL Mouth Rinse q12n4p  . chlorhexidine  15 mL Mouth Rinse BID  . feeding supplement (ENSURE ENLIVE)  237 mL Oral Once per day on Mon Thu  . hydrocerin   Topical BID  . nystatin cream   Topical TID  . sodium chloride  250 mL Intravenous Once   Continuous Infusions: . sodium chloride Stopped (08/14/15 1100)    Time spent: > 35 minutes  Velvet Bathe  [facility] Pager [phone] If 7PM-7AM, please contact night-coverage at [email or web address], password [facility] 08/15/2015, 10:10 AM  LOS: 7 days

## 2015-08-15 NOTE — Consult Note (Signed)
[facility] Liaison:  Received request from [place] for family interest in [facility] with instructions that sister not available until after 3pm by phone. Chart reviewed and spoke with Vaughan Basta after 3pm. She is agreeable to transfer to [facility]. We plan to meet at 7:30 tomorrow morning after Vaughan Basta gets off work. Dr. Orpah Melter to assume care per [name]'s request. CSW Suzanna aware of plan to transfer to [facility] tomorrow.  Please fax discharge summary to [phone].  RN please call report to [phone].  RN please leave chest port accessed for [facility].  Thank you. Erling Conte, [place]

## 2015-08-15 NOTE — Progress Notes (Signed)
Daily Progress Note   Patient Name: Brendan Hooper       Date: 08/15/2015 DOB: Jun 28, 1946  Age: 69 y.o. MRN#: [id number] Attending Physician: Velvet Bathe, MD Primary Care Physician: [name], MD Admit Date: 14-Aug-2015  Reason for Consultation/Follow-up: Establishing goals of care  Subjective:   -Scheduled f/u meeting with sister/main decision maker regarding clarification of goals of care and end of life issues, disposition and options, and symptom recommendation.  Values and goals of care important to patient and family were attempted to be elicited.  -meet at the patient's bedside along with [name]/sister, Vaughan Basta is an LPN at [employer] and understands the underlying medical issues  Concepts specific to code status, artifical feeding and hydration, continued IV antibiotics and rehospitalization was had.  The difference between a aggressive medical intervention path  and a palliative comfort care path for this patient at this time was had.  Discussed the overall poor prognosis, Vaughan Basta understands and verbalizes desire to shift to a comfort approach   Concept of Hospice was discussed  Natural trajectory and expectations at EOL were discussed.  Questions and concerns addressed.  Hard Choices booklet left for review. Family encouraged to call with questions or concerns.  PMT will continue to support holistically.   Length of Stay: 7 days  Current Medications: Scheduled Meds:  . antiseptic oral rinse  7 mL Mouth Rinse q12n4p  . chlorhexidine  15 mL Mouth Rinse BID  . feeding supplement (ENSURE ENLIVE)  237 mL Oral Once per day on Mon Thu  . hydrocerin   Topical BID  . nystatin cream   Topical TID  . sodium chloride  250 mL Intravenous Once    Continuous Infusions: . sodium  chloride Stopped (08/14/15 1100)    PRN Meds: albuterol, alum & mag hydroxide-simeth, lidocaine-prilocaine, morphine CONCENTRATE, ondansetron (ZOFRAN) IV, sodium chloride  Physical Exam: Physical Exam  Constitutional: He appears lethargic. He appears cachectic. He appears ill.  HENT:  Mouth/Throat: Oropharynx is clear and moist. No oropharyngeal exudate.  Cardiovascular: Normal heart sounds.  Tachycardia present.   Pulmonary/Chest: He has decreased breath sounds in the right lower field and the left lower field.  Abdominal: Soft. Normal appearance.  Musculoskeletal:       Right shoulder: He exhibits decreased range of motion and decreased strength.  Neurological: He appears lethargic.  Vital Signs: BP 92/49 mmHg  Pulse 118  Temp(Src) 98.4 F (36.9 C) (Oral)  Resp 27  Ht 6' (1.829 m)  Wt 60.201 kg (132 lb 11.5 oz)  BMI 18.00 kg/m2  SpO2 94% SpO2: SpO2: 94 % O2 Device: O2 Device: Not Delivered O2 Flow Rate:    Intake/output summary:  Intake/Output Summary (Last 24 hours) at 08/15/15 1610 Last data filed at 08/15/15 0600  Gross per 24 hour  Intake 2603.33 ml  Output   2250 ml  Net 353.33 ml   LBM: Last BM Date: 08/14/15 Baseline Weight: Weight: 60.2 kg (132 lb 11.5 oz) Most recent weight: Weight: 60.201 kg (132 lb 11.5 oz)       Palliative Assessment/Data: Flowsheet Rows        Most Recent Value   Intake Tab    Referral Department  Hospitalist   Unit at Time of Referral  ER   Palliative Care Primary Diagnosis  Cancer   Date Notified  09/05/15   Palliative Care Type  New Palliative care   Reason for referral  Clarify Goals of Care   Date of Admission  09/05/15   # of days IP prior to Palliative referral  0   Clinical Assessment    Psychosocial & Spiritual Assessment    Palliative Care Outcomes       Additional Data Reviewed: CBC    Component Value Date/Time   WBC 19.9* 08/14/2015 1000   WBC 7.4 08/03/2015 1138   RBC 3.72* 08/14/2015 1000     RBC 5.26 08/03/2015 1138   HGB 10.5* 08/14/2015 1000   HGB 14.9 08/03/2015 1138   HCT 30.1* 08/14/2015 1000   HCT 42.5 08/03/2015 1138   PLT 302 08/14/2015 1000   PLT 258 08/03/2015 1138   MCV 80.9 08/14/2015 1000   MCV 80.8 08/03/2015 1138   MCH 28.2 08/14/2015 1000   MCH 28.3 08/03/2015 1138   MCHC 34.9 08/14/2015 1000   MCHC 35.1 08/03/2015 1138   RDW 16.8* 08/14/2015 1000   RDW 18.2* 08/03/2015 1138   LYMPHSABS 1.0 09/05/2015 1247   LYMPHSABS 1.2 08/03/2015 1138   MONOABS 1.3* 09-05-2015 1247   MONOABS 0.9 08/03/2015 1138   EOSABS 0.9* September 05, 2015 1247   EOSABS 0.1 08/03/2015 1138   BASOSABS 0.0 2015/09/05 1247   BASOSABS 0.0 08/03/2015 1138    CMP     Component Value Date/Time   NA 137 08/15/2015 0610   NA 143 08/03/2015 1138   K 3.3* 08/15/2015 0610   K 3.1* 08/03/2015 1138   CL 108 08/15/2015 0610   CO2 22 08/15/2015 0610   CO2 24 08/03/2015 1138   GLUCOSE 143* 08/15/2015 0610   GLUCOSE 120 08/03/2015 1138   BUN 5* 08/15/2015 0610   BUN 10.6 08/03/2015 1138   CREATININE 0.54* 08/15/2015 0610   CREATININE 0.8 08/03/2015 1138   CALCIUM 8.8* 08/15/2015 0610   CALCIUM 10.6* 08/03/2015 1138   PROT 6.5 05-Sep-2015 1247   PROT 7.0 08/03/2015 1138   ALBUMIN 3.4* 05-Sep-2015 1247   ALBUMIN 3.3* 08/03/2015 1138   AST 25 09/05/2015 1247   AST 25 08/03/2015 1138   ALT 12* 09-05-15 1247   ALT 15 08/03/2015 1138   ALKPHOS 59 Sep 05, 2015 1247   ALKPHOS 82 08/03/2015 1138   BILITOT 1.0 09-05-15 1247   BILITOT 0.59 08/03/2015 1138   GFRNONAA >60 08/15/2015 0610   GFRAA >60 08/15/2015 0610       Problem List:  Patient Active Problem List   Diagnosis  Date Noted  . Palliative care encounter   . Post-obstructive pneumonia due to foreign body aspiration 18-Aug-2015  . 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 Pulmonary embolus ([place]) 06/14/2015  . 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 Hypotension 04/04/2015  . Thrush 04/04/2015  . Anorexia 04/04/2015  .  Weight loss 04/04/2015  . Hypoalbuminemia 04/04/2015  . Rash 04/04/2015  . Hypotension arterial 03/20/2015  . Oral thrush 03/20/2015  . Dehydration 03/05/2015  . FTT (failure to thrive) in adult 03/05/2015  . Generalized weakness 03/05/2015  . Weakness   . Hypomagnesemia 02/08/2015  . Neck pain   . Brain metastases ([place]) 09/19/2014  . Bronchogenic cancer of right lung ([place]) 08/25/2014  . Hypokalemia   . Protein-calorie malnutrition, severe ([place]) 08/08/2014  . Enlargement of lymph node   . CAP (community acquired pneumonia) 08/07/2014  . Lung mass 08/07/2014  . Abscess of right groin 08/07/2014  . Postobstructive pneumonia 08/06/2014  . Syncope 10/11/2011  . Alcohol abuse 10/11/2011  . HTN (hypertension) 10/11/2011  . Current smoker 10/11/2011     Palliative Care Assessment & Plan    1.Code Status:  DNR    Code Status Orders        Start     Ordered   08/11/2015 7371  Do not attempt resuscitation (DNR)   Continuous    Question Answer Comment  In the event of cardiac or respiratory ARREST Do not call a "code blue"   In the event of cardiac or respiratory ARREST Do not perform Intubation, CPR, defibrillation or ACLS   In the event of cardiac or respiratory ARREST Use medication by any route, position, wound care, and other measures to relive pain and suffering. May use oxygen, suction and manual treatment of airway obstruction as needed for comfort.      August 18, 2015 1704    Advance Directive Documentation        Most Recent Value   Type of Advance Directive  Living will, Healthcare Power of Attorney   Pre-existing out of facility DNR order (yellow form or pink MOST form)     "MOST" Form in Place?         2. Goals of Care/Additional Recommendations:   Limitations on Scope of Treatment: Full Comfort Care  Desire for further Chaplaincy support: declines at this time  Psycho-social Needs: Education on Hospice and Grief/Bereavement Support  3. Symptom  Management:      1.  Pain/Dyspnea:  Morphine 1 mg IV every 1 hr prn       2.  Agitation: Ativan 1 mg IV every 4 htrs prn  4. Palliative Prophylaxis:   Aspiration, Bowel Regimen, Delirium Protocol, Eye Care, Frequent Pain Assessment, Oral Care and Turn Reposition  5. Prognosis: < 2 weeks  6. Discharge Planning:  Hospice facility, will write for choice   Care plan was discussed with Dr Wendee Beavers  Thank you for allowing the Palliative Medicine Team to assist in the care of this patient.   Time In: 0745 Time Out: 0820 Total Time 35 min Prolonged Time Billed  no         Knox Royalty, NP  08/15/2015, 8:17 AM  Please contact Palliative Medicine Team phone at [phone] for questions and concerns.

## 2015-08-16 DIAGNOSIS — T17908S Unspecified foreign body in respiratory tract, part unspecified causing other injury, sequela: Secondary | ICD-10-CM

## 2015-08-16 DIAGNOSIS — L512 Toxic epidermal necrolysis [Lyell]: Secondary | ICD-10-CM

## 2015-08-16 DIAGNOSIS — Z66 Do not resuscitate: Secondary | ICD-10-CM

## 2015-08-16 DIAGNOSIS — Z515 Encounter for palliative care: Secondary | ICD-10-CM

## 2015-08-16 MED ORDER — HEPARIN SOD (PORK) LOCK FLUSH 100 UNIT/ML IV SOLN
500.0000 [IU] | INTRAVENOUS | Status: DC | PRN
  Filled 2015-08-16: qty 5

## 2015-08-16 MED ORDER — HEPARIN SOD (PORK) LOCK FLUSH 100 UNIT/ML IV SOLN: 500.0000 [IU] | INTRAVENOUS | Status: DC

## 2015-08-16 MED ORDER — MORPHINE SULFATE (CONCENTRATE) 10 MG/0.5ML PO SOLN: 2.5000 mg | ORAL | Status: AC | PRN

## 2015-08-16 MED ORDER — NYSTATIN 100000 UNIT/GM EX CREA: TOPICAL_CREAM | Freq: Three times a day (TID) | CUTANEOUS | Status: AC

## 2015-08-16 MED ORDER — BENZONATATE 100 MG PO CAPS: 100.0000 mg | ORAL_CAPSULE | Freq: Three times a day (TID) | ORAL | Status: AC | PRN

## 2015-08-16 MED ORDER — LORAZEPAM 2 MG/ML PO CONC: 1.0000 mg | Freq: Four times a day (QID) | ORAL | Status: AC | PRN

## 2015-08-16 MED ORDER — CHLORHEXIDINE GLUCONATE 0.12 % MT SOLN: 15.0000 mL | Freq: Two times a day (BID) | OROMUCOSAL | Status: AC

## 2015-08-16 MED ORDER — HYDROCERIN EX CREA: 1.0000 "application " | TOPICAL_CREAM | Freq: Two times a day (BID) | CUTANEOUS | Status: AC

## 2015-08-16 NOTE — Clinical Social Work Placement (Signed)
   CLINICAL SOCIAL WORK PLACEMENT  NOTE  Date:  08/16/2015  Patient Details  Name: Brendan Hooper MRN: [id number] Date of Birth: 1945-09-21  Clinical Social Work is seeking post-discharge placement for this patient at the [place] level of care (*CSW will initial, date and re-position this form in  chart as items are completed):  Yes   Patient/family provided with [facility] Work Department's list of facilities offering this level of care within the geographic area requested by the patient (or if unable, by the patient's family).  Yes   Patient/family informed of their freedom to choose among providers that offer the needed level of care, that participate in Medicare, Medicaid or managed care program needed by the patient, have an available bed and are willing to accept the patient.  Yes   Patient/family informed of [facility]'s ownership interest in [facility] and [facility], as well as of the fact that they are under no obligation to receive care at these facilities.  PASRR submitted to EDS on 08/10/15     PASRR number received on 08/10/15     Existing PASRR number confirmed on       FL2 transmitted to all facilities in geographic area requested by pt/family on 08/10/15     FL2 transmitted to all facilities within larger geographic area on       Patient informed that his/her managed care company has contracts with or will negotiate with certain facilities, including the following:        Yes   Patient/family informed of bed offers received.  Patient chooses bed at Other - please specify in the comment section below: [facility])     Physician recommends and patient chooses bed at      Patient to be transferred to Other - please specify in the comment section below: ([facility]) on 08/16/15.  Patient to be transferred to facility by ambulance [facility])     Patient family notified on 08/16/15 of transfer.  Name of family member  notified:  pt sister, Vaughan Basta notified via telephone     PHYSICIAN Please sign FL2, Please sign DNR     Additional Comment:    _______________________________________________ Ladell Pier, LCSW 08/16/2015, 10:12 AM

## 2015-08-16 NOTE — Discharge Summary (Signed)
Discharge Summary  Brendan Hooper PJA:[id number] DOB: 09-15-1945  PCP: [name], MD  Admit date: 19-Aug-2015 Discharge date: 08/16/2015  Time spent: <9mns  Recommendations for Outpatient Follow-up:  1. Patient is discharged to [facility]  Discharge Diagnoses:  Active Hospital Problems   Diagnosis Date Noted  . Postobstructive pneumonia 08/06/2014  . DNR (do not resuscitate)   . Dyspnea   . Palliative care encounter   . Post-obstructive pneumonia due to foreign body aspiration 12016-12-31 . Pulmonary embolus ([facility] 06/14/2015  . 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 Dehydration 03/05/2015  . Generalized weakness 03/05/2015  . Brain metastases ([facility] 09/19/2014  . Bronchogenic cancer of right lung ([facility] 08/25/2014  . Hypokalemia   . Protein-calorie malnutrition, severe ([facility] 08/08/2014  . Alcohol abuse 10/11/2011    Resolved Hospital Problems   Diagnosis Date Noted Date Resolved  No resolved problems to display.    Discharge Condition: stable  Diet recommendation: diet as tolerated  Filed Weights   [id number] 08/10/15 0700 08/15/15 1230  Weight: 132 lb 11.5 oz (60.2 kg) 132 lb 11.5 oz (60.201 kg) 134 lb 1.6 oz (60.827 kg)    History of present illness: (per admission MD Dr SCandiss Norse LDelbert Phenixis a 69y.o. 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   Apparently he's been gradually getting weak over the last several weeks, weakness is generalized, denies any headache, no fever chills, no problems with vision or hearing, he does have a cough, denies any shortness of breath, denies any abdominal pain or diarrhea, no blood in stool or urine, he does have chronic blisters due to toxic epidermal necrolysis side effect of immunotherapy, no new joint pains or aches or focal weakness.  In the ER workup consistent with severe dehydration, hypokalemia, generalized weakness and postobstructive pneumonia and I was called to admit the patient.  Hospital Course:  Principal Problem:   Postobstructive pneumonia Active Problems:   Alcohol abuse   Protein-calorie malnutrition, severe (HCC)   Hypokalemia   Bronchogenic cancer of right lung (HCC)   Brain metastases (HCC)   Dehydration   Generalized weakness   Toxic epidermal necrolysis (HCC)   Bullous pemphigoid   Pulmonary embolus (HCC)   Post-obstructive pneumonia due to foreign body aspiration   Palliative care encounter   DNR (do not resuscitate)   Dyspnea  1. Postobstructive right-sided pneumonia.  - Currently plan is for comfort care measures. Antibiotics have been discontinued by Palliative after discussion with sister at bedside.  2. 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  - Pt is currently DO NOT RESUSCITATE - Palliative care on board and started comfort care measures, discharge to hospice facility    3. Recent diagnosis of PE.  - Please see above   4. History of bullous pemphigoid/Toxic epidermal Necrolysis - abortive care with wound care, apply nystatin cream to open blisters.   5. Severe dehydration,  severe protein calorie malnutrition  - now comfort measures  6. Generalized weakness.  - comfort measures  7. Hypokalemia - replaced orally,  resolved on last check  8.  Hypomagnesemia - replaced  Code StatusDO NOT RESUSCITATE  Family Communication: patient and sister in room  Consultants:  Palliative care  Procedures:  none  Antibiotics:  Discontinued broad spectrum abx's 08/15/15, then transitioned to comfort measures  Discharge Exam: BP 80/56 mmHg  Pulse 120  Temp(Src) 98.3 F (36.8 C) (Oral)  Resp 20  Ht 6' (1.829 m)  Wt 134 lb 1.6 oz (60.827 kg)  BMI 18.18 kg/m2  SpO2 98%  General: Frail, occasionally weak cough Cardiovascular: RRR Respiratory: rales right base, no wheezes,   Discharge Instructions You were cared for by a hospitalist during your hospital stay. If you have any questions about your discharge medications or the care you received while you were in the hospital after you are discharged, you can call the unit and asked to speak with the hospitalist on call if the hospitalist that took care of you is not available. Once you are discharged, your primary care physician will handle any further medical issues. Please note that NO REFILLS for any discharge medications will be authorized once you are discharged, as it is imperative that you return to your primary care physician (or establish a relationship with a primary care physician if you do not have one) for your aftercare needs so that they can reassess your need for medications and monitor your lab values.  Discharge Instructions    Discharge instructions    Complete by:  As directed   Diet as tolerated     Increase activity slowly    Complete by:  As directed             Medication List    STOP taking these medications        cholecalciferol 1000 units tablet  Commonly known as:  VITAMIN D     dexamethasone 4 MG tablet  Commonly known as:  DECADRON     feeding supplement (ENSURE ENLIVE) Liqd     folic acid 1 MG tablet  Commonly known as:  FOLVITE     multivitamin with minerals Tabs tablet     oxyCODONE 5 MG immediate release tablet  Commonly known as:   Oxy IR/ROXICODONE     potassium chloride SA 20 MEQ tablet  Commonly known as:  K-DUR,KLOR-CON     rivaroxaban 20 MG Tabs tablet  Commonly known as:  XARELTO     thiamine 100 MG tablet      TAKE these medications        acetaminophen 325 MG tablet  Commonly known as:  TYLENOL  Take 2 tablets (650 mg total) by mouth every 6 (six) hours as needed for mild pain (or Fever >/= 101).     alum & mag hydroxide-simeth 200-200-20 MG/5ML suspension  Commonly known as:  MAALOX/MYLANTA  Take 30 mLs by mouth every 6 (six) hours as needed for indigestion or heartburn (dyspepsia).     benzonatate 100 MG capsule  Commonly known as:  TESSALON PERLES  Take 1 capsule (100 mg total) by mouth 3 (three) times daily as needed for cough.     chlorhexidine 0.12 % solution  Commonly known as:  PERIDEX  15 mLs by Mouth Rinse route 2 (two) times daily.     diphenhydrAMINE 25 MG tablet  Commonly known as:  BENADRYL  Take 25 mg by mouth every 6 (  six) hours as needed for itching or allergies.     hydrocerin Crea  Apply 1 application topically 2 (two) times daily.     lidocaine-prilocaine cream  Commonly known as:  EMLA  Apply 1 application topically as needed.     LORazepam 2 MG/ML concentrated solution  Commonly known as:  ATIVAN  Take 0.5 mLs (1 mg total) by mouth every 6 (six) hours as needed for anxiety.     morphine CONCENTRATE 10 MG/0.5ML Soln concentrated solution  Take 0.13 mLs (2.6 mg total) by mouth every 2 (two) hours as needed for moderate pain or severe pain.     nystatin cream  Commonly known as:  MYCOSTATIN  Apply topically 3 (three) times daily.     ondansetron 4 MG tablet  Commonly known as:  ZOFRAN  Take 1 tablet (4 mg total) by mouth every 6 (six) hours as needed for nausea.     prochlorperazine 10 MG tablet  Commonly known as:  COMPAZINE  Take 1 tablet (10 mg total) by mouth every 6 (six) hours as needed for nausea or vomiting.       No Known Allergies    The  results of significant diagnostics from this hospitalization (including imaging, microbiology, ancillary and laboratory) are listed below for reference.    Significant Diagnostic Studies: Ct Angio Chest Pe W/cm &/or Wo Cm  2015/08/28  CLINICAL DATA:  Shortness of breath and chest pain. Known lung carcinoma. EXAM: CT ANGIOGRAPHY CHEST WITH CONTRAST TECHNIQUE: Multidetector CT imaging of the chest was performed using the standard protocol during bolus administration of intravenous contrast. Multiplanar CT image reconstructions and MIPs were obtained to evaluate the vascular anatomy. CONTRAST:  107m OMNIPAQUE IOHEXOL 350 MG/ML SOLN COMPARISON:  Chest radiograph [id number] chest CT January 27, 2015 FINDINGS: There is no demonstrable pulmonary embolus. There is atherosclerotic calcification in the aorta. There is no thoracic aortic aneurysm or dissection. The visualized great vessels appear normal except for foci of atherosclerotic calcification near the origins of these vessels. There is underlying emphysematous change. The mass arising from the posterior segment of the right lobe of the liver extends into the adjacent right mediastinum and right hilar regions. This mass currently measures 7.9 x 7.7 x 5.0 cm. This mass has irregular lobulated borders. There are several adjacent smaller masses which are partially discernible from the main dominant mass. The largest of these adjacent masses measures 1.6 x 1.4 cm. Posterior to this dominant mass, there is airspace consolidation in the superior and posterior segments of the right lower lobe. There is a small right pleural effusion. There are multiple scattered metastatic lesions throughout the right lung. The largest of these metastatic foci is in the posterior segment right lower lobe near the right hemidiaphragm measuring 1.3 x 1.3 cm. There are multiple subcentimeter nodular lesions in the right upper and middle lobe regions consistent with metastatic foci.  There is also a 7 x 6 mm nodular lesion consistent with a metastatic focus in the anterior segment of the left upper lobe. There is underlying emphysematous change. Thyroid appears normal. There is right hilar adenopathy with the largest right hilar lymph node measuring 1.9 x 1.8 cm. There is a sub- carinal lymph node measuring 2.1 x 1.5 cm. There is a left hilar lymph node measuring 1.4 x 0.9 cm. There is a lymph node to the left of the lower trachea measuring 1.3 x 1.0 cm. Pericardium is not thickened. In the visualized upper abdomen, there are  adrenal masses bilaterally consistent with metastases. The right adrenal mass measures 3.6 x 2.9 cm. The left adrenal mass measures 3.1 x 2.3 cm. There is a 6 mm probable cyst in the anterior segment of the right lobe of the liver. There are renal cysts bilaterally, more on the right than on the left. There is atherosclerotic calcification in the upper abdominal aorta. There is a mass arising from the left L1 vertebral body extending into the left anterior thecal sac, impressing on the lower cord. This metastatic focus in the L1 vertebral body measures 4.6 x 4.5 cm. This mass extends into the left paraspinous region. There is blastic change in portions of this vertebral body as well. There is a destructive lesion in the left T1 vertebral body. There is a sclerotic lesion in the left T3 vertebral body. Review of the MIP images confirms the above findings. IMPRESSION: No demonstrable pulmonary embolus. There is a dominant mass arising from the posterior segment right upper lobe with invasion of the adjacent right hilum and mediastinum. There are multiple parenchymal lung metastases, more on the right than on the left. There is airspace consolidation consistent with pneumonia in the right lower lobe with small right effusion. There is adenopathy at multiple sites. There are bilateral adrenal masses consistent with metastases. There are destructive and blastic bony metastases.  The largest of these lesions is at L1 on the left which extends into the left paraspinous region as well as into the anterior thecal sac on the left without frank cord/conus invasion. In comparison with the prior study from 6 months prior. There has been significant progression of metastatic disease at multiple sites. Electronically Signed   By: Lowella Grip III M.D.   On: 08-27-2015 15:26   Dg Chest Port 1 View  08/14/2015  CLINICAL DATA:  Current smoker with history of hypertension. Tachypnea and confusion. Lung cancer. EXAM: PORTABLE CHEST 1 VIEW COMPARISON:  Radiographs and CT 08-27-15. FINDINGS: 1004 hours. Left subclavian Port-A-Cath tip is unchanged at the SVC right atrial level. The heart size and mediastinal contours are stable. There are lower lung volumes with new airspace disease at the left lung base. The dominant right perihilar lung mass centered in the middle lobe on CT is again noted. Generalized interstitial prominence has developed, suspicious for superimposed edema. There is no significant pleural effusion. The bones appear unchanged. IMPRESSION: New generalized interstitial prominence throughout both lungs, most consistent with edema superimposed on underlying right lung cancer. There is left basilar airspace disease which may reflect atelectasis or pneumonia. Electronically Signed   By: Richardean Sale M.D.   On: 08/14/2015 10:25   Dg Chest Port 1 View  August 27, 2015  CLINICAL DATA:  Lung carcinoma. Sickle cell disease. Hypotension and weakness EXAM: PORTABLE CHEST 1 VIEW COMPARISON:  Chest CT May 29, 2015; chest radiograph March 06, 2015 FINDINGS: The mass arising in the posterior segment right upper lobe is again noted, currently measuring 7.0 x 5.1 cm. There is surrounding or overlying patchy airspace consolidation. The left lung is clear. Heart size and pulmonary vascular normal. Port-A-Cath tip is in the superior vena cava. No pneumothorax. No adenopathy is discernible on  this single view. No blastic or lytic bone lesions are apparent. IMPRESSION: Persistent mass in the right mid lung region. There is either surrounding or overlying airspace consolidation. There may well be pneumonia overlying this mass, difficult to discern on this single view. Left lung clear. Cardiac silhouette within normal limits. Electronically Signed   By: Gwyndolyn Saxon  Jasmine December III M.D.   On: August 28, 2015 12:36    Microbiology: Recent Results (from the past 240 hour(s))  Blood culture (routine x 2)     Status: None   Collection Time: 08/28/15 12:45 PM  Result Value Ref Range Status   Specimen Description BLOOD LEFT FOREARM  Final   Special Requests IN PEDIATRIC BOTTLE 4ML  Final   Culture   Final    NO GROWTH 5 DAYS Performed at [facility]    Report Status 08/13/2015 FINAL  Final  Blood culture (routine x 2)     Status: None   Collection Time: 08-28-15  7:14 PM  Result Value Ref Range Status   Specimen Description BLOOD LEFT ARM  Final   Special Requests BOTTLES DRAWN AEROBIC AND ANAEROBIC [place]  Final   Culture   Final    NO GROWTH 5 DAYS Performed at [facility]    Report Status 08/13/2015 FINAL  Final  MRSA PCR Screening     Status: None   Collection Time: Aug 28, 2015  9:21 PM  Result Value Ref Range Status   MRSA by PCR NEGATIVE NEGATIVE Final    Comment:        The GeneXpert MRSA Assay (FDA approved for NASAL specimens only), is one component of a comprehensive MRSA colonization surveillance program. It is not intended to diagnose MRSA infection nor to guide or monitor treatment for MRSA infections.   C difficile quick scan w PCR reflex     Status: None   Collection Time: 08/10/15  4:26 PM  Result Value Ref Range Status   C Diff antigen NEGATIVE NEGATIVE Final   C Diff toxin NEGATIVE NEGATIVE Final   C Diff interpretation Negative for toxigenic C. difficile  Final  MRSA PCR Screening     Status: None   Collection Time: 08/13/15 10:15 AM  Result Value  Ref Range Status   MRSA by PCR NEGATIVE NEGATIVE Final    Comment:        The GeneXpert MRSA Assay (FDA approved for NASAL specimens only), is one component of a comprehensive MRSA colonization surveillance program. It is not intended to diagnose MRSA infection nor to guide or monitor treatment for MRSA infections.      Labs: Basic Metabolic Panel:  Recent Labs Lab 08/11/15 0004 08/12/15 0420 08/13/15 08/13/15 0447 08/13/15 0448 08/13/15 1848 08/14/15 0458 08/14/15 1000 08/15/15 0610  NA  --  149* 148* 151*  --  151* 145  --  137  K  --  3.0* 3.1* 3.8  --  3.3* 3.1*  --  3.3*  CL  --  112* 118* 117*  --  117* 116*  --  108  CO2  --  '24 23 23  '$ --  25 21*  --  22  GLUCOSE  --  113* 175* 100*  --  182* 171*  --  143*  BUN  --  5* 8 7  --  9 6  --  5*  CREATININE  --  0.59* 0.62 0.73  --  0.71 0.63  --  0.54*  CALCIUM  --  9.0 8.6* 9.1  --  8.7* 8.7*  --  8.8*  MG 1.2* 1.5*  --   --  1.5*  --  1.5*  --  1.7  PHOS  --   --   --   --   --   --   --  2.2*  --    Liver Function Tests: No results  for input(s): AST, ALT, ALKPHOS, BILITOT, PROT, ALBUMIN in the last 168 hours. No results for input(s): LIPASE, AMYLASE in the last 168 hours. No results for input(s): AMMONIA in the last 168 hours. CBC:  Recent Labs Lab 08/11/15 08/12/15 0420 08/13/15 08/13/15 0447 08/14/15 1000  WBC 14.1* 17.6* 22.1* 24.1* 19.9*  HGB 13.1 13.1 10.5* 13.2 10.5*  HCT 37.6* 38.2* 30.8* 37.8* 30.1*  MCV 79.7 80.1 81.1 80.9 80.9  PLT 258 320 312 333 302   Cardiac Enzymes: No results for input(s): CKTOTAL, CKMB, CKMBINDEX, TROPONINI in the last 168 hours. BNP: BNP (last 3 results) No results for input(s): BNP in the last 8760 hours.  ProBNP (last 3 results) No results for input(s): PROBNP in the last 8760 hours.  CBG: No results for input(s): GLUCAP in the last 168 hours.     SignedFlorencia Reasons MD, PhD  [facility] 08/16/2015, 9:17 AM

## 2015-08-16 NOTE — Progress Notes (Signed)
Pt for discharge to [facility].  CSW facilitated pt discharge needs including speaking [facility] liaison, Erling Conte, faxing pt discharge summary, discussing with pt sister via telephone, providing RN phone number to call report, and arranging ambulance transport for pt to [facility].  Pt sister was aware of transfer and completed admission paperwork with [facility] this morning.   No further social work needs identified at this time.  CSW signing off.   Alison Murray, MSW, [facility] [employer] [phone]

## 2015-08-20 NOTE — ED Notes (Signed)
Patient transported to CT 

## 2015-08-20 NOTE — ED Notes (Signed)
Patient from [facility] c/o weakness and pain.  Patient with Hx of cancer, hypotension, and hypokalemia.  Patient Denies Fever, Chest pain, dizziness, and SOB with EMS.  Per EMS patient sinus tack 120-130 en route.  20g in left forearm.  Per EMS saline bolus delivered en route.

## 2015-08-20 NOTE — Progress Notes (Signed)
ANTIBIOTIC CONSULT NOTE - INITIAL  Pharmacy Consult for Unasyn Indication: Pneumonia  No Known Allergies  Patient Measurements: Weight: 132 lb 11.5 oz (60.2 kg)  Vital Signs: Temp: 98.1 F (36.7 C) (12/20 1557) Temp Source: Oral (12/20 1557) BP: 125/90 mmHg (12/20 1557) Pulse Rate: 113 (12/20 1557) Intake/Output from previous day:   Intake/Output from this shift: Total I/O In: 1000 [IV Piggyback:1000] Out: -   Labs:  Recent Labs  08/24/15 1247  WBC 9.2  HGB 14.4  PLT 259  CREATININE 0.72   Estimated Creatinine Clearance: 73.2 mL/min (by C-G formula based on Cr of 0.72). No results for input(s): VANCOTROUGH, VANCOPEAK, VANCORANDOM, GENTTROUGH, GENTPEAK, GENTRANDOM, TOBRATROUGH, TOBRAPEAK, TOBRARND, AMIKACINPEAK, AMIKACINTROU, AMIKACIN in the last 72 hours.   Microbiology: No results found for this or any previous visit (from the past 720 hour(s)).  Medical History: Past Medical History  Diagnosis Date  . GERD (gastroesophageal reflux disease)   . Headache(784.0)   . Sickle cell anemia (HCC)   . Lung mass   . Hypertension   . Bronchogenic cancer of right lung ([place]) 08/25/2014  . Brain cancer ([place])     non small cell lung ca with mets to brain    Medications:  Scheduled:  . ampicillin-sulbactam (UNASYN) IV  3 g Intravenous Q6H  . azithromycin  500 mg Intravenous Q24H  . cholecalciferol  1,000 Units Oral Weekly  . dexamethasone  8 mg Oral BID  . [START ON 08/10/2015] feeding supplement (ENSURE ENLIVE)  237 mL Oral Once per day on Mon Thu  . feeding supplement (PRO-STAT SUGAR FREE 64)  30 mL Oral TID WC  . folic acid  1 mg Oral Daily  . nystatin cream   Topical TID  . [START ON 08/09/2015] potassium chloride SA  20 mEq Oral Daily  . potassium chloride  40 mEq Oral Q4H  . thiamine  100 mg Oral Weekly   Infusions:   Assessment:  70 yr male with PMH including lung cancer with mets to the brain and currently on palliative chemotherapy, being admitted for  pneumonia  Pharmacy consulted to dose Unasyn for treatment of aspiration pneumonia.  MD also dosing Azithromycin.  12/20 >>Unasyn >> 12/20 >>Azithromycin >>    12/20 blood: 12/20 sputum:   Goal of Therapy:  Eradication of infection  Plan:  Follow up culture results  Follow renal function Azithromycin per MD Unasyn 3gm IV q6h  Kynedi Profitt, Toribio Harbour, PharmD 08/24/15,5:19 PM

## 2015-08-20 NOTE — ED Notes (Signed)
Bed: RESA Expected date:  Expected time:  Means of arrival:  Comments: EMS- [place] pt, generalized pain/hypotension/tachycardia

## 2015-08-20 NOTE — H&P (Signed)
Patient Demographics:    Brendan Hooper, is a 70 y.o. male  MRN: [id number]   DOB - October 05, 1945  Admit Date - 08-23-2015  Outpatient Primary MD for the patient is [name], MD   With History of -  Past Medical History  Diagnosis Date  . GERD (gastroesophageal reflux disease)   . Headache(784.0)   . Sickle cell anemia (HCC)   . Lung mass   . Hypertension   . Bronchogenic cancer of right lung ([place]) 08/25/2014  . Brain cancer ([place])     non small cell lung ca with mets to brain      Past Surgical History  Procedure Laterality Date  . No past surgeries    . Portacath placement Left 09/01/2014    Procedure: INSERTION PORT-A-CATH;  Surgeon: Melrose Nakayama, MD;  Location: [facility];  Service: Thoracic;  Laterality: Left;    in for   Chief Complaint  Patient presents with  . Weakness      HPI:    Brendan Hooper  is a 70 y.o. 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  In the ER workup consistent with severe dehydration, hypokalemia, generalized weakness and postobstructive pneumonia and I was called to admit the patient.    Review of systems:      No Fever-chills, No Headache, No changes with Vision or hearing, No problems swallowing food or Liquids, No Chest pain, positive cough but no shortness of breath, No Abdominal pain, No Nausea or Vommitting, Bowel movements are regular, No Blood in stool or Urine, No dysuria, No new skin rashes or bruises, except chronic blisters or lower his body No new joints pains-aches,  No new weakness, tingling, numbness in any extremity, generalized weakness all over No recent weight gain or loss, No polyuria, polydypsia or polyphagia, No significant Mental Stressors.  A full 10 point Review of Systems was done, except as stated above, all other Review of Systems were negative.    Social History:  Social History  Substance Use Topics  . Smoking status: Current Every Day Smoker -- 0.50 packs/day for 40 years    Types: Cigarettes    Start date: 08/19/1957  . Smokeless tobacco: Never Used  . Alcohol Use: 0.0 oz/week    0 Standard drinks or equivalent per week     Comment: social    Lives - at home with a roommate, limited mobility due to generalized weakness, bedbound for the last few days     Family History :     Family History  Problem Relation Age of Onset  . Cancer Mother   . Stroke Father        Home Medications:   Prior to Admission medications   Medication Sig Start Date End Date Taking? Authorizing Provider  alum & mag hydroxide-simeth (MAALOX/MYLANTA) 200-200-20 MG/5ML suspension Take 30 mLs by mouth every 6 (six) hours as needed for indigestion or heartburn (dyspepsia). 03/07/15  Yes Robbie Lis, MD    cholecalciferol (VITAMIN D) 1000 UNITS tablet Take 1,000 Units by mouth once a week.    Yes Historical Provider, MD  feeding supplement, ENSURE ENLIVE, (ENSURE ENLIVE) LIQD Take 237 mLs by mouth 3 (three) times daily between meals. Patient taking differently: Take 237 mLs by mouth 2 (two) times a week.  03/07/15  Yes Robbie Lis, MD  folic acid (FOLVITE) 1 MG tablet Take 1 tablet (1 mg total) by mouth daily. 08/11/14  Yes Modena Jansky, MD  lidocaine-prilocaine (EMLA) cream Apply 1 application topically as needed. Patient taking differently: Apply 1 application topically as needed (port access).  09/02/14  Yes Curt Bears, MD  Multiple Vitamin (MULTIVITAMIN WITH MINERALS) TABS tablet Take 1 tablet by mouth daily. Patient taking differently: Take 1 tablet by mouth once a week.  08/11/14  Yes Modena Jansky, MD  ondansetron (ZOFRAN) 4 MG tablet Take 1 tablet (4 mg total) by mouth every 6 (six) hours as needed for nausea. 03/07/15  Yes Robbie Lis, MD  oxyCODONE (OXY IR/ROXICODONE) 5 MG immediate release tablet Take 1-2 tablets (5-10 mg total) by mouth every 4 (four) hours as needed for moderate pain. 07/18/15  Yes Curt Bears, MD  potassium chloride SA (K-DUR,KLOR-CON) 20 MEQ tablet Take 1 tablet (20 mEq total) by mouth daily. 08/04/15  Yes Curt Bears, MD  rivaroxaban (XARELTO) 20 MG TABS tablet Take 1 tablet (20 mg total) by mouth daily with supper. 06/14/15  Yes Curt Bears, MD  thiamine 100 MG tablet Take 1 tablet (100 mg total) by mouth daily. Patient taking differently: Take 100 mg by mouth once a week.  08/11/14  Yes Modena Jansky, MD  acetaminophen (TYLENOL) 325 MG tablet Take 2 tablets (650 mg total) by mouth every 6 (six) hours as needed for mild pain (or Fever >/= 101). 03/07/15   Robbie Lis, MD  dexamethasone (DECADRON) 4 MG tablet Take 2 tablets (8 mg total) by mouth 2 (two) times daily. Take 2 tablet the day before teh day of and the day after chemo. 06/14/15    Curt Bears, MD  diphenhydrAMINE (BENADRYL) 25 MG tablet Take 25 mg by mouth every 6 (six) hours as needed for itching or allergies.     Historical Provider, MD  prochlorperazine (COMPAZINE) 10 MG tablet Take 1 tablet (10 mg total) by mouth every 6 (six) hours as needed for nausea or vomiting. 06/14/15   Curt Bears, MD     Allergies:    No Known Allergies  Physical Exam:   Vitals  Blood pressure 139/106, pulse 117, temperature 97.7 F (36.5 C), temperature source Oral, resp. rate 18, SpO2 99 %.   1. General frail elderly African-American male lying in bed in NAD,    2. Normal affect and insight, Not Suicidal or Homicidal, Awake Alert, Oriented X 3.  3. No F.N deficits, ALL C.Nerves Intact, Strength 5/5 all 4 extremities, Sensation intact all 4 extremities, Plantars down going.  4. Ears and Eyes appear Normal, Conjunctivae clear, PERRLA. Moist Oral Mucosa.  5. Supple Neck, No JVD, No cervical lymphadenopathy appriciated, No Carotid Bruits.  6. Symmetrical Chest wall movement, Good air movement bilaterally, coarse rales in the right base  7. RRR, No Gallops, Rubs or Murmurs, No Parasternal Heave.  8. Positive Bowel Sounds, Abdomen Soft, No tenderness, No organomegaly appriciated,No rebound -guarding or rigidity.  9.  No Cyanosis, Normal Skin Turgor, chronic small blisters all over the skin,  10. Good muscle tone,  joints appear normal , no effusions, Normal ROM.  11. No Palpable Lymph Nodes in Neck or Axillae      Data Review:    CBC  Recent Labs Lab 08/03/15 1138 2015-08-19 1247  WBC 7.4 9.2  HGB 14.9 14.4  HCT 42.5 40.9  PLT 258 259  MCV 80.8 80.2  MCH 28.3 28.2  MCHC 35.1 35.2  RDW 18.2* 17.2*  LYMPHSABS 1.2 1.0  MONOABS 0.9 1.3*  EOSABS 0.1 0.9*  BASOSABS 0.0 0.0   ------------------------------------------------------------------------------------------------------------------  Chemistries   Recent Labs Lab 08/03/15 1138 August 19, 2015 1247    NA 143 146*  K 3.1* 2.8*  CL  --  108  CO2 24 25  GLUCOSE 120 114*  BUN 10.6 11  CREATININE 0.8 0.72  CALCIUM 10.6* 10.1  AST 25 25  ALT 15 12*  ALKPHOS 82 59  BILITOT 0.59 1.0   ------------------------------------------------------------------------------------------------------------------ CrCl cannot be calculated (Unknown ideal weight.). ------------------------------------------------------------------------------------------------------------------ No results for input(s): TSH, T4TOTAL, T3FREE, THYROIDAB in the last 72 hours.  Invalid input(s): FREET3   Coagulation profile No results for input(s): INR, PROTIME in the last 168 hours. ------------------------------------------------------------------------------------------------------------------- No results for input(s): DDIMER in the last 72 hours. -------------------------------------------------------------------------------------------------------------------  Cardiac Enzymes No results for input(s): CKMB, TROPONINI, MYOGLOBIN in the last 168 hours.  Invalid input(s): CK ------------------------------------------------------------------------------------------------------------------ Invalid input(s): POCBNP   ---------------------------------------------------------------------------------------------------------------  Urinalysis    Component Value Date/Time   COLORURINE AMBER* 03/27/2015 2053   APPEARANCEUR CLEAR 03/27/2015 2053   LABSPEC 1.024 03/27/2015 2053   PHURINE 5.5 03/27/2015 2053   GLUCOSEU NEGATIVE 03/27/2015 2053   HGBUR NEGATIVE 03/27/2015 2053   BILIRUBINUR SMALL* 03/27/2015 2053   KETONESUR NEGATIVE 03/27/2015 2053   PROTEINUR 30 08/03/2015 1149   UROBILINOGEN 1.0 03/27/2015 2053   NITRITE NEGATIVE 03/27/2015 2053   LEUKOCYTESUR NEGATIVE 03/27/2015 2053    ----------------------------------------------------------------------------------------------------------------   Imaging  Results:    Ct Angio Chest Pe W/cm &/or Wo Cm  2015-08-19  CLINICAL DATA:  Shortness of breath and chest pain. Known lung carcinoma. EXAM: CT ANGIOGRAPHY CHEST WITH CONTRAST TECHNIQUE: Multidetector CT imaging of the chest was performed using the standard protocol during bolus administration of intravenous contrast. Multiplanar CT image reconstructions and MIPs were obtained to evaluate the vascular anatomy. CONTRAST:  58m OMNIPAQUE IOHEXOL 350 MG/ML SOLN COMPARISON:  Chest radiograph [id number] chest CT January 27, 2015 FINDINGS: There is no demonstrable pulmonary embolus. There is atherosclerotic calcification in the aorta. There is no thoracic aortic aneurysm or dissection. The visualized great vessels appear normal  except for foci of atherosclerotic calcification near the origins of these vessels. There is underlying emphysematous change. The mass arising from the posterior segment of the right lobe of the liver extends into the adjacent right mediastinum and right hilar regions. This mass currently measures 7.9 x 7.7 x 5.0 cm. This mass has irregular lobulated borders. There are several adjacent smaller masses which are partially discernible from the main dominant mass. The largest of these adjacent masses measures 1.6 x 1.4 cm. Posterior to this dominant mass, there is airspace consolidation in the superior and posterior segments of the right lower lobe. There is a small right pleural effusion. There are multiple scattered metastatic lesions throughout the right lung. The largest of these metastatic foci is in the posterior segment right lower lobe near the right hemidiaphragm measuring 1.3 x 1.3 cm. There are multiple subcentimeter nodular lesions in the right upper and middle lobe regions consistent with metastatic foci. There is also a 7 x 6 mm nodular lesion consistent with a metastatic focus in the anterior segment of the left upper lobe. There is underlying emphysematous change. Thyroid  appears normal. There is right hilar adenopathy with the largest right hilar lymph node measuring 1.9 x 1.8 cm. There is a sub- carinal lymph node measuring 2.1 x 1.5 cm. There is a left hilar lymph node measuring 1.4 x 0.9 cm. There is a lymph node to the left of the lower trachea measuring 1.3 x 1.0 cm. Pericardium is not thickened. In the visualized upper abdomen, there are adrenal masses bilaterally consistent with metastases. The right adrenal mass measures 3.6 x 2.9 cm. The left adrenal mass measures 3.1 x 2.3 cm. There is a 6 mm probable cyst in the anterior segment of the right lobe of the liver. There are renal cysts bilaterally, more on the right than on the left. There is atherosclerotic calcification in the upper abdominal aorta. There is a mass arising from the left L1 vertebral body extending into the left anterior thecal sac, impressing on the lower cord. This metastatic focus in the L1 vertebral body measures 4.6 x 4.5 cm. This mass extends into the left paraspinous region. There is blastic change in portions of this vertebral body as well. There is a destructive lesion in the left T1 vertebral body. There is a sclerotic lesion in the left T3 vertebral body. Review of the MIP images confirms the above findings. IMPRESSION: No demonstrable pulmonary embolus. There is a dominant mass arising from the posterior segment right upper lobe with invasion of the adjacent right hilum and mediastinum. There are multiple parenchymal lung metastases, more on the right than on the left. There is airspace consolidation consistent with pneumonia in the right lower lobe with small right effusion. There is adenopathy at multiple sites. There are bilateral adrenal masses consistent with metastases. There are destructive and blastic bony metastases. The largest of these lesions is at L1 on the left which extends into the left paraspinous region as well as into the anterior thecal sac on the left without frank cord/conus  invasion. In comparison with the prior study from 6 months prior. There has been significant progression of metastatic disease at multiple sites. Electronically Signed   By: Lowella Grip III M.D.   On: August 23, 2015 15:26   Dg Chest Port 1 View  2015-08-23  CLINICAL DATA:  Lung carcinoma. Sickle cell disease. Hypotension and weakness EXAM: PORTABLE CHEST 1 VIEW COMPARISON:  Chest CT May 29, 2015; chest radiograph March 06, 2015  FINDINGS: The mass arising in the posterior segment right upper lobe is again noted, currently measuring 7.0 x 5.1 cm. There is surrounding or overlying patchy airspace consolidation. The left lung is clear. Heart size and pulmonary vascular normal. Port-A-Cath tip is in the superior vena cava. No pneumothorax. No adenopathy is discernible on this single view. No blastic or lytic bone lesions are apparent. IMPRESSION: Persistent mass in the right mid lung region. There is either surrounding or overlying airspace consolidation. There may well be pneumonia overlying this mass, difficult to discern on this single view. Left lung clear. Cardiac silhouette within normal limits. Electronically Signed   By: Lowella Grip III M.D.   On: 08/25/2015 12:36    My personal review of EKG: Rhythm S.Tach, Rate  123/min,  Non specific ST changes   Assessment & Plan:      1. Postobstructive right-sided pneumonia. In a patient with right adenocarcinoma of the lung. He is DO NOT RESUSCITATE and extremely frail, will be admitted to [facility], IV antibiotics which will include azithromycin and Unasyn covering for postobstructive pneumonia, supportive care with oxygen nebulizer treatment. Blood and sputum cultures have been drawn. Long-term prognosis extremely poor.   2. 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 Discussed his case with Dr. [name], he has failed chemotherapy and immunotherapy as dictated in history of present illness,  currently on palliative chemotherapy, long-term prognosis poor, I discussed this with Dr. [name] and with patient, he is DO NOT RESUSCITATE, palliative care will be consulted to discuss goals of care.    3. Recent diagnosis of PE. On xaralto continue pharmacy to dose, latest CT scan shows resolution of PE, likely minimum 6 months of treatment as he is hypercoagulable from malignancy.   4. History of bullous pemphigoid/Toxic epidermal Necrolysis - abortive care with wound care, apply nystatin cream to open blisters.   5. Severe dehydration, severe protein calorie malnutrition and hypokalemia. Replace potassium, 2 L IV fluid bolus along with maintenance IV fluids. Consult dietitian. Place on pro-stat.   6. Generalized weakness. Initiate PT.    DVT Prophylaxis Xaralto   AM Labs Ordered, also please review Full Orders  Family Communication: Admission, patients condition and plan of care including tests being ordered have been discussed with the patient and brother who indicate understanding and agree with the plan and Code Status.  Code Status Full  [facility]  Condition GUARDED    Time spent in minutes : 35    SINGH,PRASHANT K M.D on 2015-08-25 at 3:35 PM  Between 7am to 7pm - Pager - [phone]  After 7pm go to [email or web address] - password [email or web address]  [facility] - Office  [phone]

## 2015-08-20 NOTE — Consult Note (Signed)
WOC wound consult note Reason for Consult:Patient known from a previous admission, as his partial thickness tissue loss is a result of a  dermatological condition, it is slightly outside the scope of [facility].  That said, I am able to suggest conservative care strategies that will assist this patient and his nursing care team for bathing, topical skin care and pressure redistribution. Wound type: autoimmune, immunosuppression Pressure Ulcer POA: No Measurement: covers body Wound bed: some intact bullae with serous fluid and others ruptured, of varying sizes and shapes. Drainage (amount, consistency, odor) Ruptured bullae with no exudate, dry, pale pink wound bed Periwound: dry, flaking skin Dressing procedure/placement/frequency:I will provide guidance for the Nursing staff for a therapeutic sleep surface, bathing technique and application of a supportive moisturizer (Eucerin) twice daily.  Heels will be floated and a prophylactic sacral foam dressing placed to prevent ulceration. Further interventions will need to come from Dermatology.  If you agree or need their input, please arrange for consultation with that Service. [place] nursing team will not follow, but will remain available to this patient, the nursing and medical teams.  Please re-consult if needed. Thanks, Maudie Flakes, MSN, RN, [place], Arther Abbott  Pager# [phone]

## 2015-08-20 NOTE — Progress Notes (Signed)
ANTICOAGULATION CONSULT NOTE - Initial Consult  Pharmacy Consult for Xarelto Indication: pulmonary embolus  No Known Allergies  Patient Measurements: Weight: 132 lb 11.5 oz (60.2 kg)  Vital Signs: Temp: 98.1 F (36.7 C) (12/20 1557) Temp Source: Oral (12/20 1557) BP: 125/90 mmHg (12/20 1557) Pulse Rate: 113 (12/20 1557)  Labs:  Recent Labs  08/27/15 1247  HGB 14.4  HCT 40.9  PLT 259  CREATININE 0.72    Estimated Creatinine Clearance: 73.2 mL/min (by C-G formula based on Cr of 0.72).   Medical History: Past Medical History  Diagnosis Date  . GERD (gastroesophageal reflux disease)   . Headache(784.0)   . Sickle cell anemia (HCC)   . Lung mass   . Hypertension   . Bronchogenic cancer of right lung ([place]) 08/25/2014  . Brain cancer ([place])     non small cell lung ca with mets to brain    Medications:  Scheduled:  . ampicillin-sulbactam (UNASYN) IV  3 g Intravenous Q6H  . azithromycin  500 mg Intravenous Q24H  . cholecalciferol  1,000 Units Oral Weekly  . dexamethasone  8 mg Oral BID  . [START ON 08/10/2015] feeding supplement (ENSURE ENLIVE)  237 mL Oral Once per day on Mon Thu  . feeding supplement (PRO-STAT SUGAR FREE 64)  30 mL Oral TID WC  . folic acid  1 mg Oral Daily  . hydrocerin   Topical BID  . nystatin cream   Topical TID  . [START ON 08/09/2015] potassium chloride SA  20 mEq Oral Daily  . potassium chloride  40 mEq Oral Q4H  . rivaroxaban  20 mg Oral Q supper  . thiamine  100 mg Oral Weekly   Infusions:    Assessment:  70 yr male admitted 12/20 for pneumonia.  PMH including metastatic lung cancer undergoing palliative chemotherapy.  Patient known to pharmacy from antibiotic dosing.  Pharmacy consulted to continue dosing of Xarelto for h/o PE  PTA patient on Xarelto '20mg'$  daily with supper - last dose taken on 12/19  CrCl ~ 73 ml/min  Goal of Therapy:  Full dose anticoagulation   Plan:  Continue Xarelto '20mg'$  daily with supper  Shirleen Mcfaul,  Toribio Harbour, PharmD 2015-08-27,5:29 PM

## 2015-08-20 NOTE — ED Provider Notes (Signed)
CSN: [id number]     Arrival date & time 15-Aug-2015  1202 History   First MD Initiated Contact with Patient 08-15-2015 1206     Chief Complaint  Patient presents with  . Weakness     (Consider location/radiation/quality/duration/timing/severity/associated sxs/prior Treatment) The history is provided by the patient.  Brendan Hooper is a 70 y.o. male hx of GERD, metastatic lung cancer no longer on chemotherapy due to progression of the cancer, hypertension here presenting with hypotension, tachycardia, failure to thrive. Patient states that he has not been eating and drinking much the last several days. Denies any vomiting. He states that he just feels very weak. Denies any fevers or cough. Denies any urinary symptoms.  Level V caveat- condition of patient.    Past Medical History  Diagnosis Date  . GERD (gastroesophageal reflux disease)   . Headache(784.0)   . Sickle cell anemia (HCC)   . Lung mass   . Hypertension   . Bronchogenic cancer of right lung ([place]) 08/25/2014  . 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 No past surgeries    . Portacath placement Left 09/01/2014    Procedure: INSERTION PORT-A-CATH;  Surgeon: Melrose Nakayama, MD;  Location: [facility] OR;  Service: Thoracic;  Laterality: Left;   Family History  Problem Relation Age of Onset  . Cancer Mother   . Stroke Father    Social History  Substance Use Topics  . Smoking status: Current Every Day Smoker -- 0.50 packs/day for 40 years    Types: Cigarettes    Start date: 08/19/1957  . Smokeless tobacco: Never Used  . Alcohol Use: 0.0 oz/week    0 Standard drinks or equivalent per week     Comment: social    Review of Systems  Neurological: Positive for weakness.  All other systems reviewed and are negative.     Allergies  Review of patient's allergies indicates no known allergies.  Home Medications   Prior to Admission medications    Medication Sig Start Date End Date Taking? Authorizing Provider  alum & mag hydroxide-simeth (MAALOX/MYLANTA) 200-200-20 MG/5ML suspension Take 30 mLs by mouth every 6 (six) hours as needed for indigestion or heartburn (dyspepsia). 03/07/15  Yes Robbie Lis, MD  cholecalciferol (VITAMIN D) 1000 UNITS tablet Take 1,000 Units by mouth once a week.    Yes Historical Provider, MD  feeding supplement, ENSURE ENLIVE, (ENSURE ENLIVE) LIQD Take 237 mLs by mouth 3 (three) times daily between meals. Patient taking differently: Take 237 mLs by mouth 2 (two) times a week.  03/07/15  Yes Robbie Lis, MD  folic acid (FOLVITE) 1 MG tablet Take 1 tablet (1 mg total) by mouth daily. 08/11/14  Yes Modena Jansky, MD  lidocaine-prilocaine (EMLA) cream Apply 1 application topically as needed. Patient taking differently: Apply 1 application topically as needed (port access).  09/02/14  Yes Curt Bears, MD  Multiple Vitamin (MULTIVITAMIN WITH MINERALS) TABS tablet Take 1 tablet by mouth daily. Patient taking differently: Take 1 tablet by mouth once a week.  08/11/14  Yes Modena Jansky, MD  ondansetron (ZOFRAN) 4 MG tablet Take 1 tablet (4 mg total) by mouth every 6 (six) hours as needed for nausea. 03/07/15  Yes Robbie Lis, MD  oxyCODONE (OXY IR/ROXICODONE) 5 MG immediate release tablet Take 1-2 tablets (5-10 mg total) by mouth every 4 (four) hours as needed for  moderate pain. 07/18/15  Yes Curt Bears, MD  potassium chloride SA (K-DUR,KLOR-CON) 20 MEQ tablet Take 1 tablet (20 mEq total) by mouth daily. 08/04/15  Yes Curt Bears, MD  rivaroxaban (XARELTO) 20 MG TABS tablet Take 1 tablet (20 mg total) by mouth daily with supper. 06/14/15  Yes Curt Bears, MD  thiamine 100 MG tablet Take 1 tablet (100 mg total) by mouth daily. Patient taking differently: Take 100 mg by mouth once a week.  08/11/14  Yes Modena Jansky, MD  acetaminophen (TYLENOL) 325 MG tablet Take 2 tablets (650 mg total) by  mouth every 6 (six) hours as needed for mild pain (or Fever >/= 101). 03/07/15   Robbie Lis, MD  dexamethasone (DECADRON) 4 MG tablet Take 2 tablets (8 mg total) by mouth 2 (two) times daily. Take 2 tablet the day before teh day of and the day after chemo. 06/14/15   Curt Bears, MD  diphenhydrAMINE (BENADRYL) 25 MG tablet Take 25 mg by mouth every 6 (six) hours as needed for itching or allergies.     Historical Provider, MD  prochlorperazine (COMPAZINE) 10 MG tablet Take 1 tablet (10 mg total) by mouth every 6 (six) hours as needed for nausea or vomiting. 06/14/15   Curt Bears, MD   BP 139/106 mmHg  Pulse 117  Temp(Src) 97.7 F (36.5 C) (Oral)  Resp 18  SpO2 99% Physical Exam  Constitutional:  Tired, dehydrated, chronically ill   HENT:  Head: Normocephalic.  MM dry   Eyes: Conjunctivae are normal. Pupils are equal, round, and reactive to light.  Neck: Normal range of motion. Neck supple.  Cardiovascular: Regular rhythm and normal heart sounds.   Tachycardic   Pulmonary/Chest: Effort normal.  Diminished throughout   Abdominal: Soft. Bowel sounds are normal. He exhibits no distension. There is no tenderness. There is no rebound.  Musculoskeletal: Normal range of motion.  Neurological: He is alert.  Tired, arousable to exam. Moving all extremities   Skin: Skin is warm and dry.  Psychiatric:  Unable   Nursing note and vitals reviewed.   ED Course  Procedures (including critical care time) Labs Review Labs Reviewed  CBC WITH DIFFERENTIAL/PLATELET - Abnormal; Notable for the following:    RDW 17.2 (*)    Monocytes Absolute 1.3 (*)    Eosinophils Absolute 0.9 (*)    All other components within normal limits  COMPREHENSIVE METABOLIC PANEL - Abnormal; Notable for the following:    Sodium 146 (*)    Potassium 2.8 (*)    Glucose, Bld 114 (*)    Albumin 3.4 (*)    ALT 12 (*)    All other components within normal limits  I-STAT CG4 LACTIC ACID, ED - Abnormal; Notable  for the following:    Lactic Acid, Venous 2.33 (*)    All other components within normal limits  CULTURE, BLOOD (ROUTINE X 2)  CULTURE, BLOOD (ROUTINE X 2)  CULTURE, EXPECTORATED SPUTUM-ASSESSMENT  URINALYSIS, ROUTINE W REFLEX MICROSCOPIC (NOT AT [facility])  I-STAT TROPOININ, ED    Imaging Review Ct Angio Chest Pe W/cm &/or Wo Cm  07-Sep-2015  CLINICAL DATA:  Shortness of breath and chest pain. Known lung carcinoma. EXAM: CT ANGIOGRAPHY CHEST WITH CONTRAST TECHNIQUE: Multidetector CT imaging of the chest was performed using the standard protocol during bolus administration of intravenous contrast. Multiplanar CT image reconstructions and MIPs were obtained to evaluate the vascular anatomy. CONTRAST:  56m OMNIPAQUE IOHEXOL 350 MG/ML SOLN COMPARISON:  Chest radiograph DJanuary 19, 2017  and chest CT January 27, 2015 FINDINGS: There is no demonstrable pulmonary embolus. There is atherosclerotic calcification in the aorta. There is no thoracic aortic aneurysm or dissection. The visualized great vessels appear normal except for foci of atherosclerotic calcification near the origins of these vessels. There is underlying emphysematous change. The mass arising from the posterior segment of the right lobe of the liver extends into the adjacent right mediastinum and right hilar regions. This mass currently measures 7.9 x 7.7 x 5.0 cm. This mass has irregular lobulated borders. There are several adjacent smaller masses which are partially discernible from the main dominant mass. The largest of these adjacent masses measures 1.6 x 1.4 cm. Posterior to this dominant mass, there is airspace consolidation in the superior and posterior segments of the right lower lobe. There is a small right pleural effusion. There are multiple scattered metastatic lesions throughout the right lung. The largest of these metastatic foci is in the posterior segment right lower lobe near the right hemidiaphragm measuring 1.3 x 1.3 cm. There are  multiple subcentimeter nodular lesions in the right upper and middle lobe regions consistent with metastatic foci. There is also a 7 x 6 mm nodular lesion consistent with a metastatic focus in the anterior segment of the left upper lobe. There is underlying emphysematous change. Thyroid appears normal. There is right hilar adenopathy with the largest right hilar lymph node measuring 1.9 x 1.8 cm. There is a sub- carinal lymph node measuring 2.1 x 1.5 cm. There is a left hilar lymph node measuring 1.4 x 0.9 cm. There is a lymph node to the left of the lower trachea measuring 1.3 x 1.0 cm. Pericardium is not thickened. In the visualized upper abdomen, there are adrenal masses bilaterally consistent with metastases. The right adrenal mass measures 3.6 x 2.9 cm. The left adrenal mass measures 3.1 x 2.3 cm. There is a 6 mm probable cyst in the anterior segment of the right lobe of the liver. There are renal cysts bilaterally, more on the right than on the left. There is atherosclerotic calcification in the upper abdominal aorta. There is a mass arising from the left L1 vertebral body extending into the left anterior thecal sac, impressing on the lower cord. This metastatic focus in the L1 vertebral body measures 4.6 x 4.5 cm. This mass extends into the left paraspinous region. There is blastic change in portions of this vertebral body as well. There is a destructive lesion in the left T1 vertebral body. There is a sclerotic lesion in the left T3 vertebral body. Review of the MIP images confirms the above findings. IMPRESSION: No demonstrable pulmonary embolus. There is a dominant mass arising from the posterior segment right upper lobe with invasion of the adjacent right hilum and mediastinum. There are multiple parenchymal lung metastases, more on the right than on the left. There is airspace consolidation consistent with pneumonia in the right lower lobe with small right effusion. There is adenopathy at multiple sites.  There are bilateral adrenal masses consistent with metastases. There are destructive and blastic bony metastases. The largest of these lesions is at L1 on the left which extends into the left paraspinous region as well as into the anterior thecal sac on the left without frank cord/conus invasion. In comparison with the prior study from 6 months prior. There has been significant progression of metastatic disease at multiple sites. Electronically Signed   By: Lowella Grip III M.D.   On: 23-Aug-2015 15:26   Dg Chest  Port 1 View  09-07-2015  CLINICAL DATA:  Lung carcinoma. Sickle cell disease. Hypotension and weakness EXAM: PORTABLE CHEST 1 VIEW COMPARISON:  Chest CT May 29, 2015; chest radiograph March 06, 2015 FINDINGS: The mass arising in the posterior segment right upper lobe is again noted, currently measuring 7.0 x 5.1 cm. There is surrounding or overlying patchy airspace consolidation. The left lung is clear. Heart size and pulmonary vascular normal. Port-A-Cath tip is in the superior vena cava. No pneumothorax. No adenopathy is discernible on this single view. No blastic or lytic bone lesions are apparent. IMPRESSION: Persistent mass in the right mid lung region. There is either surrounding or overlying airspace consolidation. There may well be pneumonia overlying this mass, difficult to discern on this single view. Left lung clear. Cardiac silhouette within normal limits. Electronically Signed   By: Lowella Grip III M.D.   On: 09-07-2015 12:36   I have personally reviewed and evaluated these images and lab results as part of my medical decision-making.   EKG Interpretation   Date/Time:  09-07-15 12:15:42 EST Ventricular Rate:  123 PR Interval:  138 QRS Duration: 98 QT Interval:  323 QTC Calculation: 462 R Axis:   -48 Text Interpretation:  Sinus tachycardia LAE, consider biatrial enlargement  Left anterior fascicular block Abnormal R-wave progression, early   transition ST elevation suggests acute pericarditis tachycardia new since  previous  Confirmed by YAO  MD, DAVID ([id number]) on 09-07-2015 1:39:07 PM      MDM   Final diagnoses:  None    Nina Hoar is a 70 y.o. male here with weakness, tachycardia. Likely has failure to thrive. Consider infection vs progression of cancer. Will get labs, CXR, UA. Will hydrate and reassess.   3:50 PM Patient still tachy 115. Never hypotensive in the ED. CXR showed possible pneumonia, CT confirmed pneumonia. Given IV abx. Will admit.    Wandra Arthurs, MD September 07, [id number]

## 2015-08-20 NOTE — ED Notes (Signed)
Attempted report x1, nurse in isolation room will call back.

## 2015-08-20 NOTE — ED Notes (Signed)
Labs drawn by Laurice Record, RN

## 2015-08-20 DEATH — deceased

## 2015-08-23 ENCOUNTER — Encounter: Payer: Self-pay | Admitting: Medical Oncology

## 2015-08-23 NOTE — Progress Notes (Signed)
Pt obituary in news and records that he died dec 30/2016

## 2015-08-31 ENCOUNTER — Ambulatory Visit: Payer: Medicare Other | Admitting: Internal Medicine

## 2015-08-31 ENCOUNTER — Ambulatory Visit: Payer: Medicare Other

## 2015-08-31 ENCOUNTER — Other Ambulatory Visit: Payer: Medicare Other

## 2015-09-02 ENCOUNTER — Ambulatory Visit: Payer: Medicare Other

## 2016-02-06 ENCOUNTER — Other Ambulatory Visit: Payer: Self-pay | Admitting: Nurse Practitioner

## 2016-05-18 IMAGING — CR DG CHEST 2V
2 series · 2 of 2 positions shown · non-contrast
Comparison: Chest radiograph and CT of the chest performed
10/11/2011

CLINICAL DATA: Acute onset of productive cough for 2 weeks. Current
history of smoking. Initial encounter.

EXAM:
CHEST  2 VIEW

[chest pa]
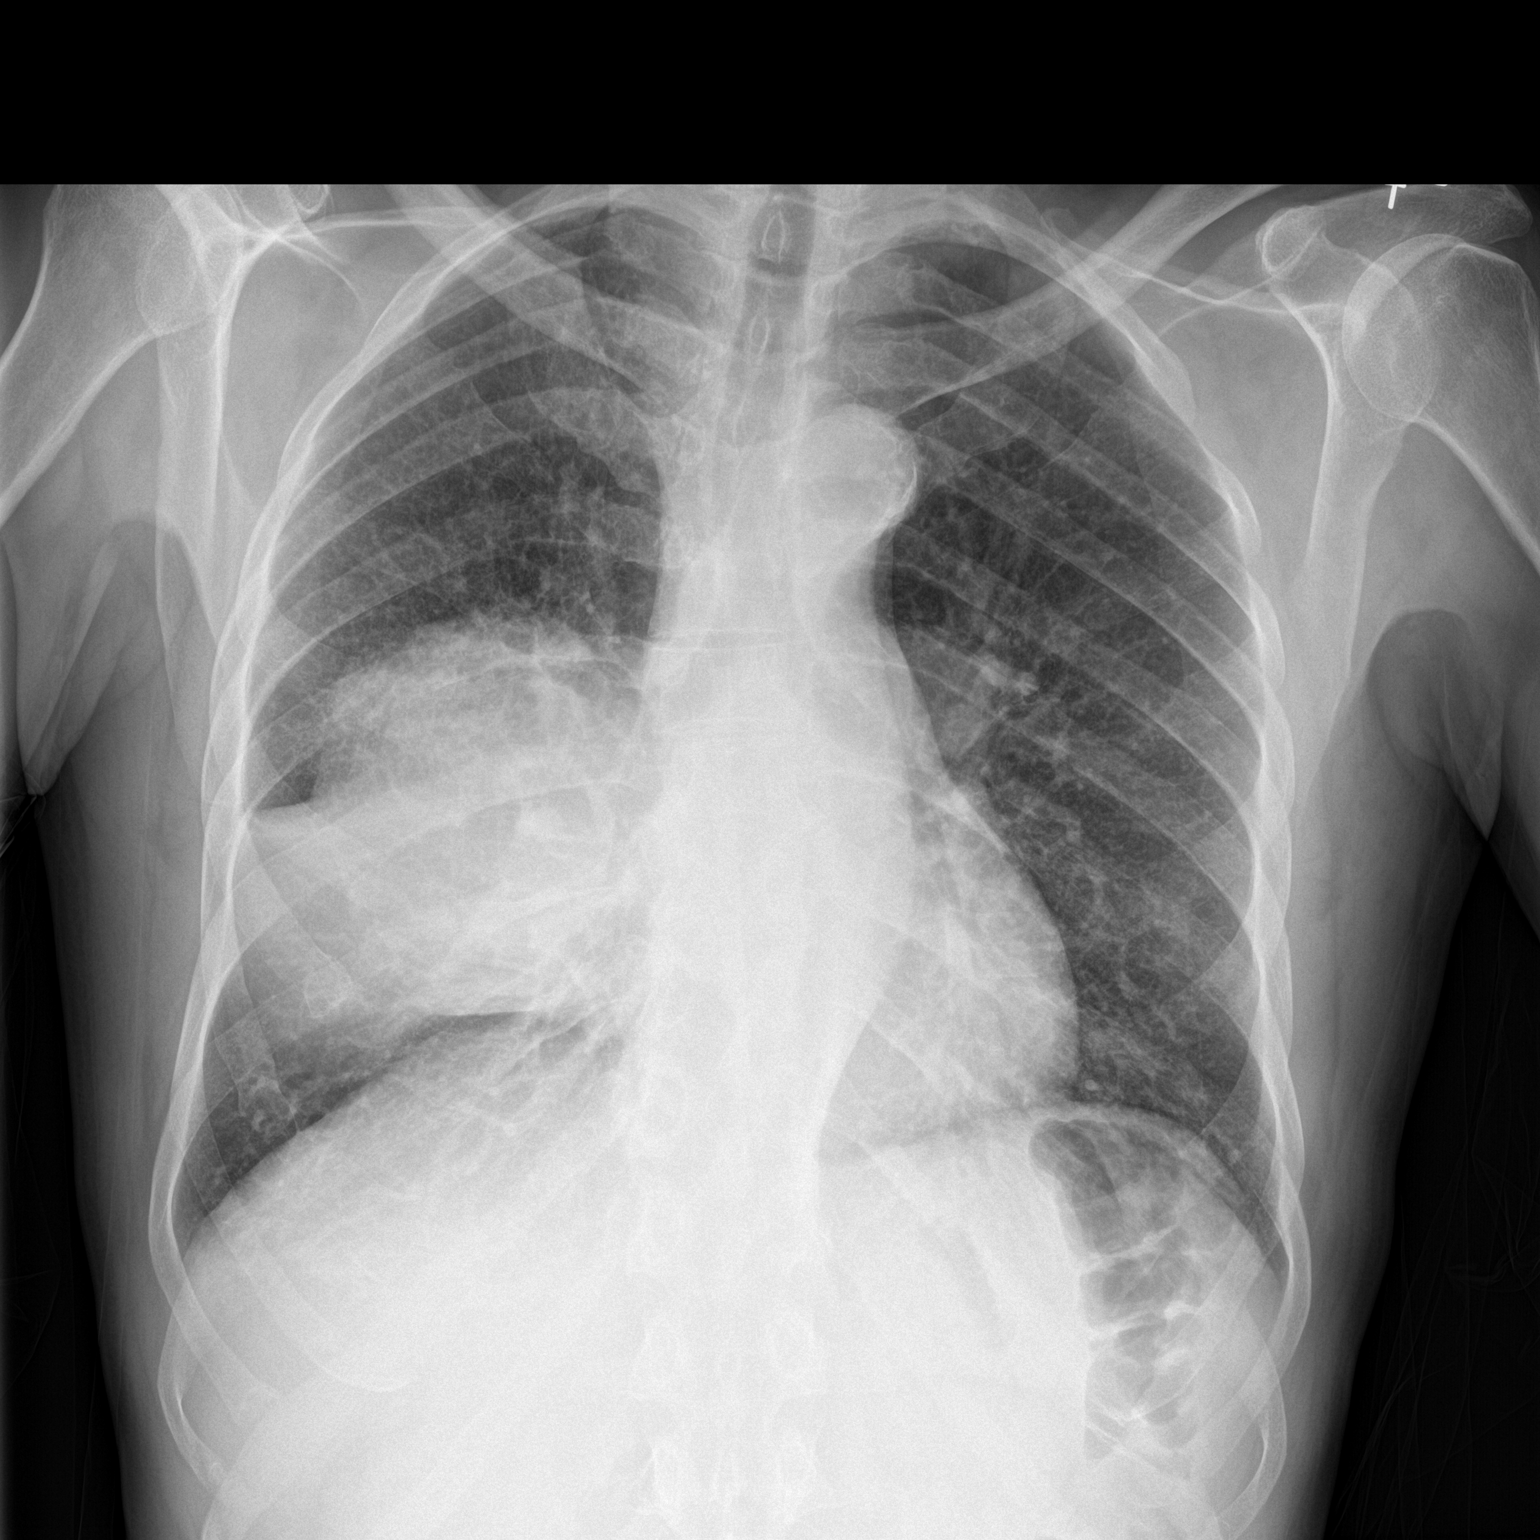

[chest lat]
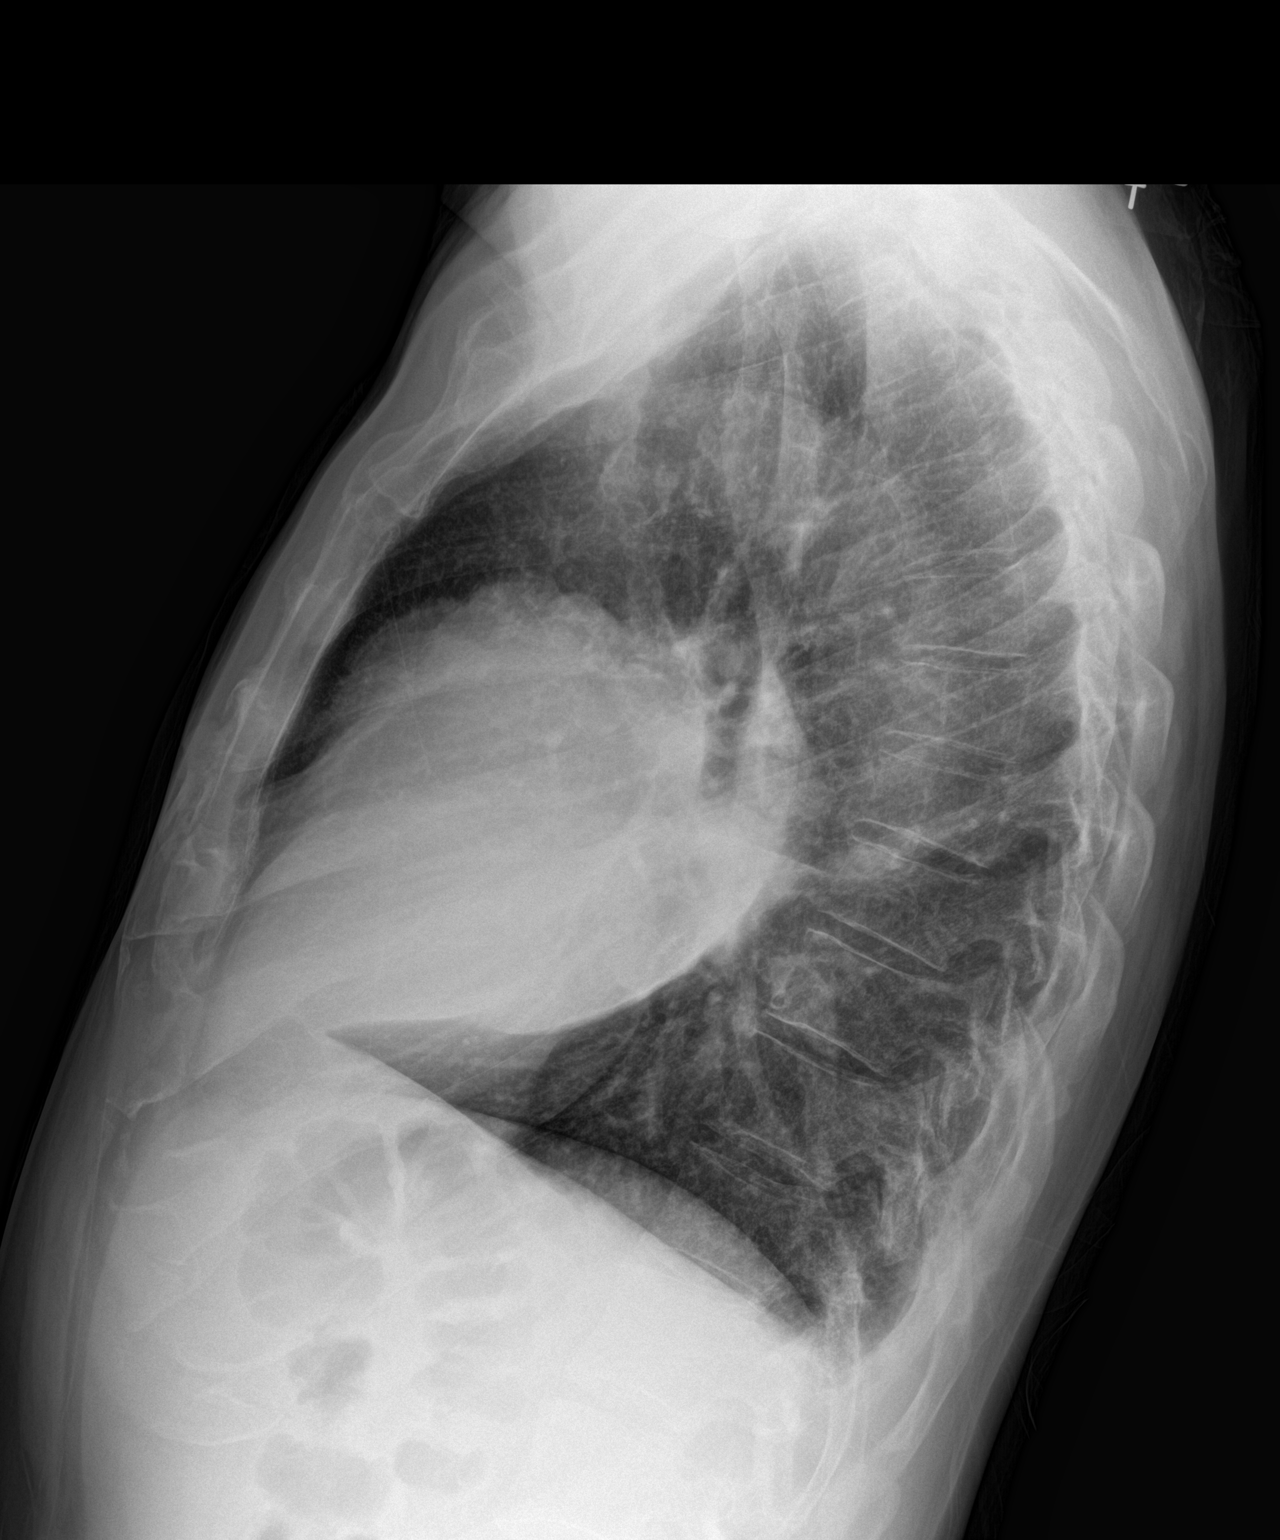

[2 of 2 positions shown; findings below may reference images not displayed]

FINDINGS: There is masslike consolidation and expansion of the right middle
lobe. Given the prior pulmonary nodule within the right upper lobe
at the right minor fissure in 8581, this is suspicious for a large
9.7 cm mass and adjacent postobstructive pneumonia.

The left lung appears relatively clear, aside from mildly increased
interstitial markings, likely chronic in nature. No pleural effusion
or pneumothorax is seen.

The heart remains normal in size. No acute osseous abnormalities are
identified.
IMPRESSION: Masslike consolidation and expansion at the right middle lobe. Given
the prior pulmonary nodule within the right upper lobe at the right
minor fissure in 8581, this is suspicious for a large 9.7 cm mass
and adjacent postobstructive pneumonia. CT of the chest would be
helpful for further evaluation, when and as deemed clinically
appropriate.

## 2016-06-25 IMAGING — MR MR HEAD WO/W CM
10 of 14 series · 33 of 48 positions shown · IV contrast (Yes)
Comparison: None.

CLINICAL DATA: Lung cancer.  Primary bronchogenic carcinoma.

EXAM:
MRI HEAD WITHOUT AND WITH CONTRAST
TECHNIQUE: Multiplanar, multiecho pulse sequences of the brain and surrounding
structures were obtained without and with intravenous contrast.
CONTRAST:  14mL MULTIHANCE GADOBENATE DIMEGLUMINE 529 MG/ML IV SOLN

[Series 4: DWI · axial · 3.0mm · 1.09mm/px · z∈[-65,+72]mm · 9 of 96 slices shown (1 of 4)]
[im 1/96]
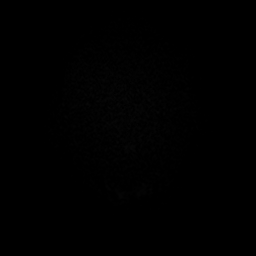
[im 12/96]
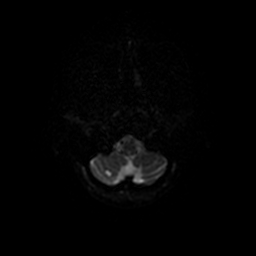
[im 24/96]
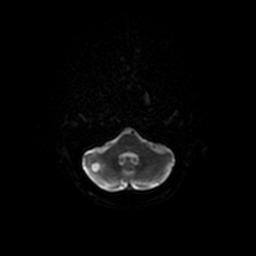
[im 36/96]
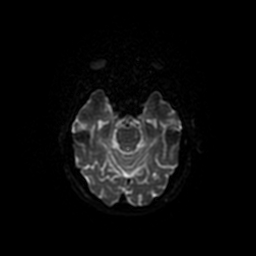
[im 48/96]
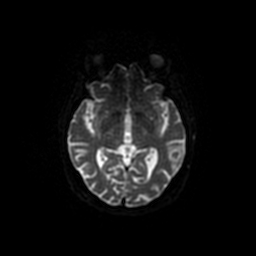
[im 60/96]
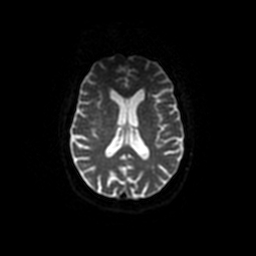
[im 72/96]
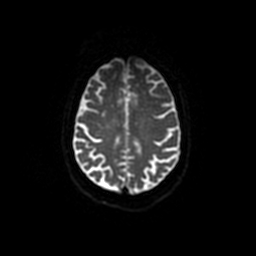
[im 84/96]
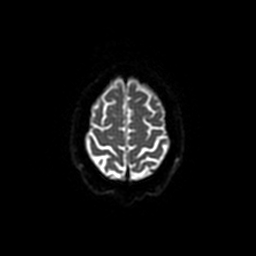
[im 96/96]
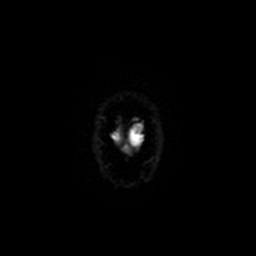

[Series 5: DWI · coronal · 5.0mm · 1.09mm/px · 5 of 58 slices shown (2 of 4)]
[im 1/58]
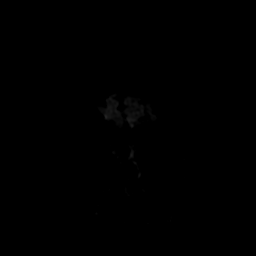
[im 15/58]
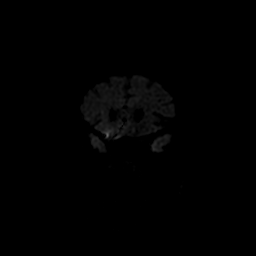
[im 29/58]
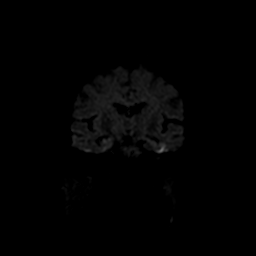
[im 43/58]
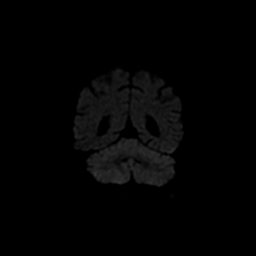
[im 58/58]
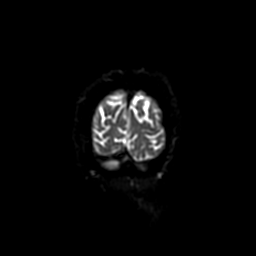

[Series 6: T2 · axial · 5.0mm · 0.43mm/px · z∈[-70,+75]mm · 2 of 24 slices shown]
[im 1/24]
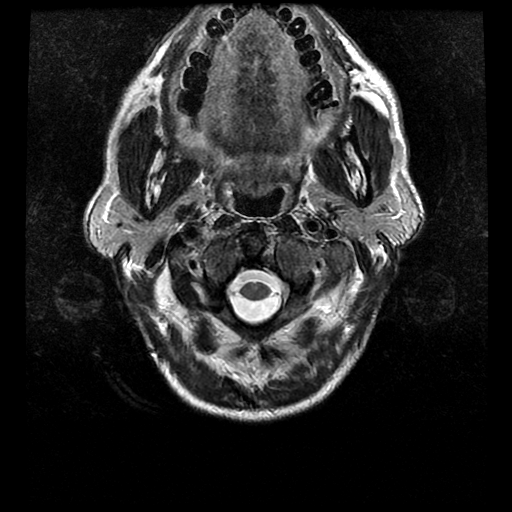
[im 24/24]
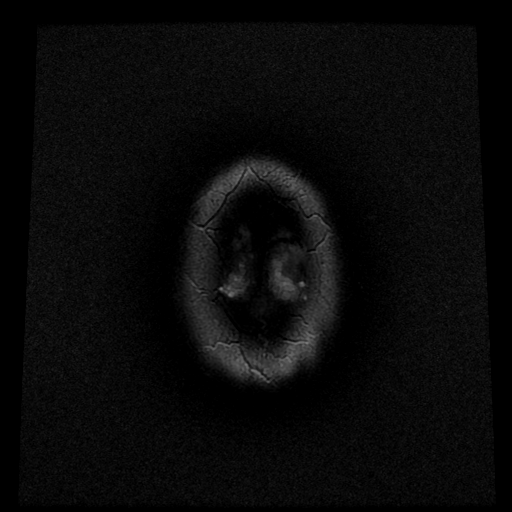

[Series 7: FLAIR · axial · 5.0mm · 0.43mm/px · z∈[-76,+80]mm · 2 of 24 slices shown (1 of 2)]
[im 1/24]
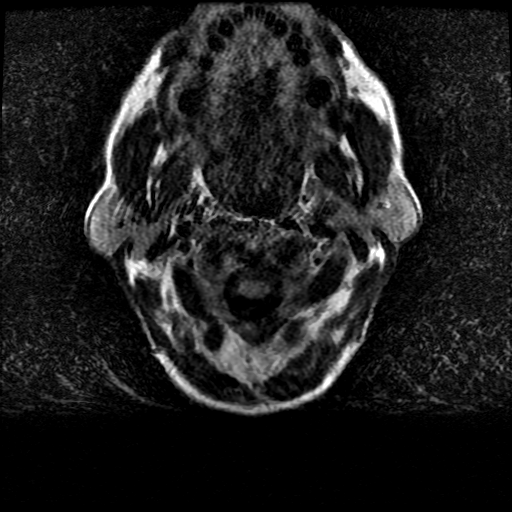
[im 24/24]
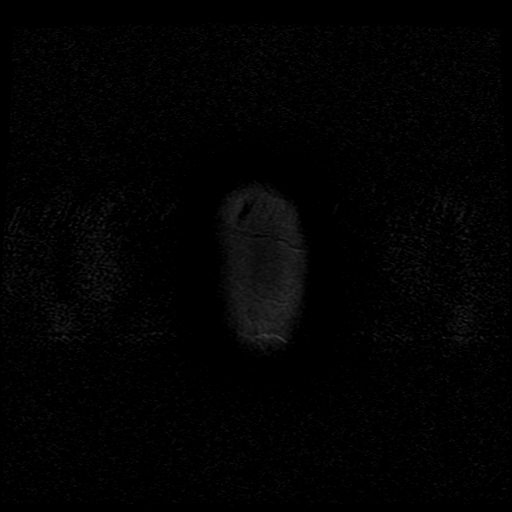

[Series 8: FLAIR · axial · 5.0mm · 0.43mm/px · z∈[-76,+81]mm · 2 of 28 slices shown (2 of 2)]
[im 1/28]
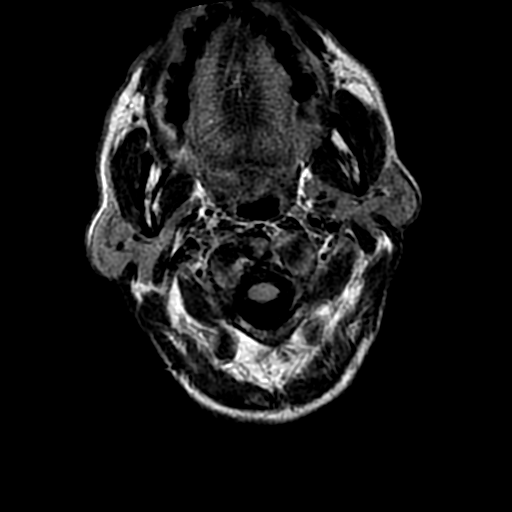
[im 28/28]
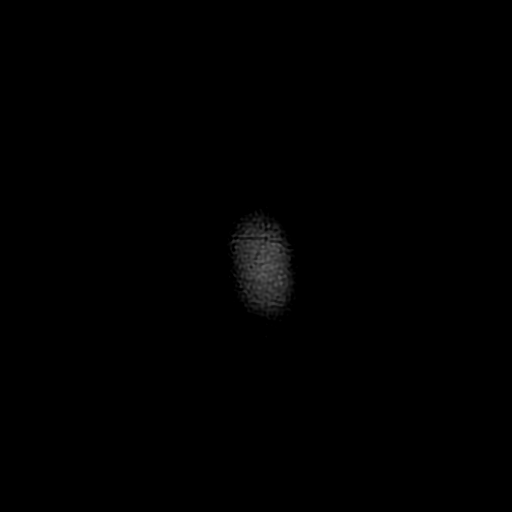

[Series 12: T2 post-contrast · coronal · 5.0mm · 0.47mm/px · 2 of 24 slices shown]
[im 1/24]
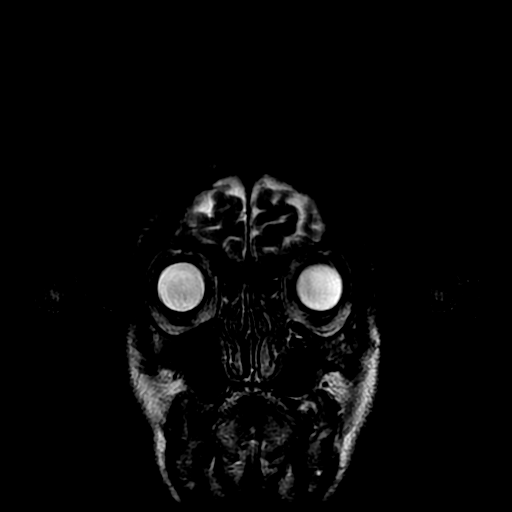
[im 24/24]
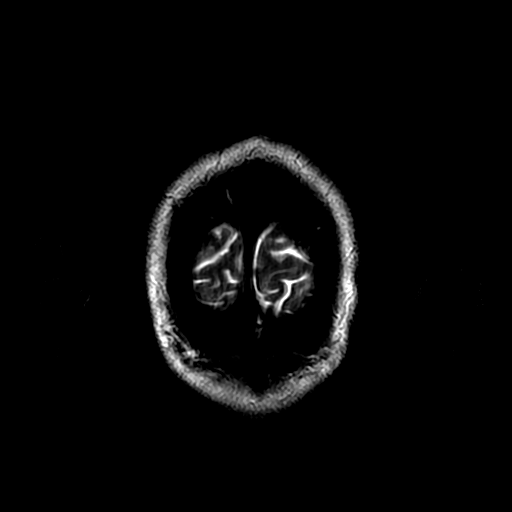

[Series 14: T1 post-contrast · coronal · 5.0mm · 0.47mm/px · 2 of 24 slices shown (1 of 2)]
[im 1/24]
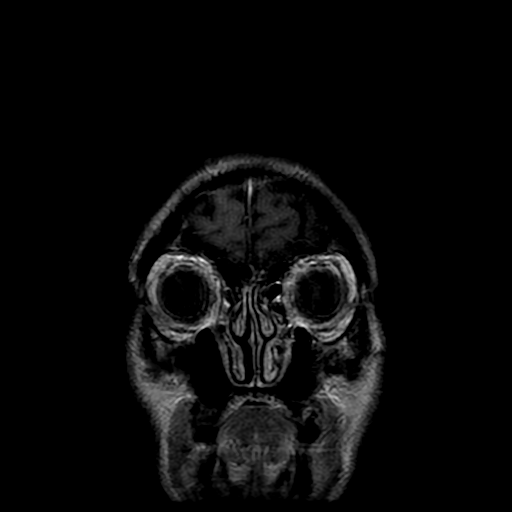
[im 24/24]
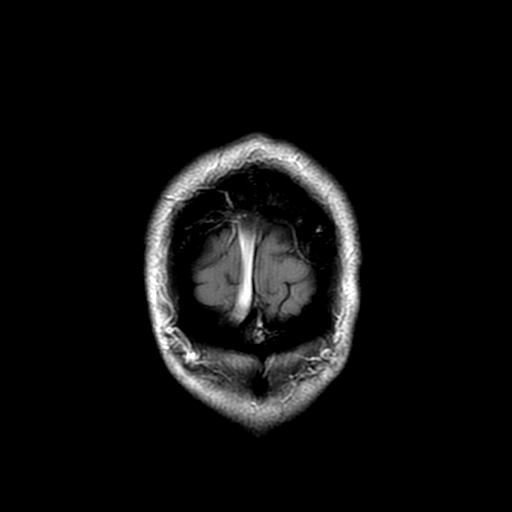

[Series 15: T1 post-contrast · sagittal · 5.0mm · 0.47mm/px · 2 of 24 slices shown (2 of 2)]
[im 1/24]
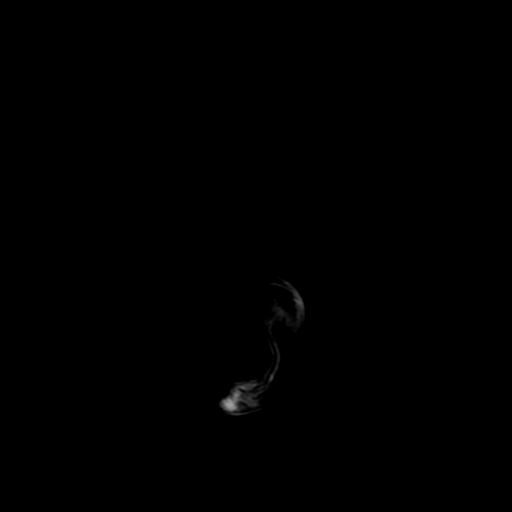
[im 24/24]
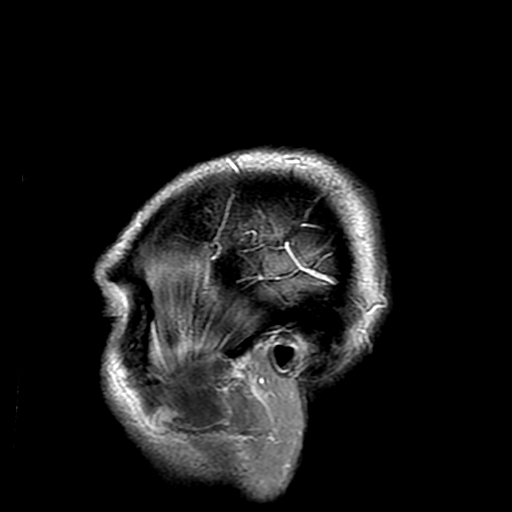

[Series 400: DWI · axial · 3.0mm · 1.09mm/px · z∈[-65,+72]mm · 4 of 48 slices shown (3 of 4)]
[im 1/48]
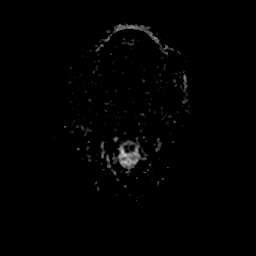
[im 16/48]
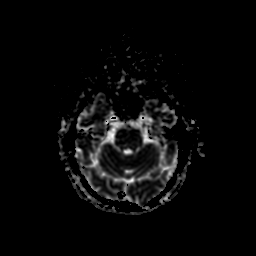
[im 32/48]
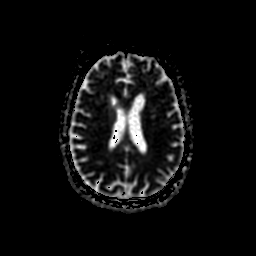
[im 48/48]
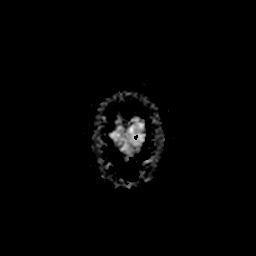

[Series 500: DWI · coronal · 5.0mm · 1.09mm/px · 3 of 29 slices shown (4 of 4)]
[im 1/29]
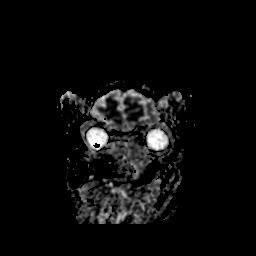
[im 15/29]
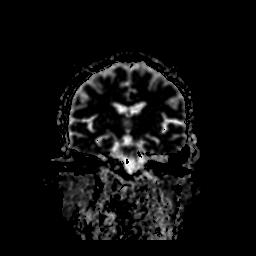
[im 29/29]
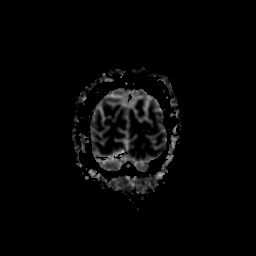

[33 of 48 positions shown; findings below may reference images not displayed]

FINDINGS: Three separate peripherally enhancing lesions are present in the
right cerebellar hemisphere. The largest lesion measures 12 x 14 mm.
Smaller lesion measures 7 and 10 mm respectively. There are 3
scratch the there 2 small lesions within the pons measuring 3 mm
each. A 2 mm lesion is present in the left cerebral hemisphere.

A left occipital lobe peripherally enhancing lesion measures 9 x 8 x
8 mm. An anterior inferior left frontal lobe lesion measures 9 x 9 x
10 mm. The largest lesion is centered within the third ventricle
measuring 13 x 17 x 13 mm.

There is no hydrocephalus. Additional periventricular and
subcortical T2 hyperintensities bilaterally are greater than
expected for age. There is no enhancement with these other lesions.

Flow is present in the major intracranial arteries. Globes and
orbits are intact.

Mild mucosal thickening is scattered throughout the ethmoid air
cells. There is some fluid in the mastoid air cells bilaterally. No
enhancement or obstructing nasopharyngeal lesion is present.
IMPRESSION: 1. Multiple brain metastases involving the right greater than left
cerebellar hemisphere, of the pons, the left occipital lobe, and the
left frontal lobe.
2. The largest enhancing lesion is centered in the third ventricle,
also likely representing a focal metastasis measuring 13 x 17 x 13
mm. There is no associated hydrocephalus.
3. Scattered periventricular and subcortical T2 hyperintensities are
present in addition to the lesions noted. These likely reflect the
sequela of chronic microvascular ischemia.
